# Patient Record
Sex: Female | Born: 1985 | Race: White | Hispanic: No | Marital: Single | Smoking: Never smoker
Health system: Southern US, Community
[De-identification: ages and names within clinical notes are randomized; demographics above are authoritative.]

## PROBLEM LIST (undated history)

## (undated) DIAGNOSIS — T7840XA Allergy, unspecified, initial encounter: Secondary | ICD-10-CM

## (undated) DIAGNOSIS — M419 Scoliosis, unspecified: Secondary | ICD-10-CM

## (undated) DIAGNOSIS — I10 Essential (primary) hypertension: Secondary | ICD-10-CM

## (undated) DIAGNOSIS — Q969 Turner's syndrome, unspecified: Secondary | ICD-10-CM

## (undated) DIAGNOSIS — K7581 Nonalcoholic steatohepatitis (NASH): Secondary | ICD-10-CM

## (undated) DIAGNOSIS — T8859XA Other complications of anesthesia, initial encounter: Secondary | ICD-10-CM

## (undated) DIAGNOSIS — F419 Anxiety disorder, unspecified: Secondary | ICD-10-CM

## (undated) DIAGNOSIS — E079 Disorder of thyroid, unspecified: Secondary | ICD-10-CM

## (undated) DIAGNOSIS — G43909 Migraine, unspecified, not intractable, without status migrainosus: Secondary | ICD-10-CM

## (undated) DIAGNOSIS — E785 Hyperlipidemia, unspecified: Secondary | ICD-10-CM

## (undated) HISTORY — PX: BILATERAL OOPHORECTOMY: SHX1221

## (undated) HISTORY — DX: Disorder of thyroid, unspecified: E07.9

## (undated) HISTORY — PX: HIP SURGERY: SHX245

## (undated) HISTORY — DX: Scoliosis, unspecified: M41.9

## (undated) HISTORY — PX: TONSILLECTOMY: SUR1361

## (undated) HISTORY — DX: Allergy, unspecified, initial encounter: T78.40XA

## (undated) HISTORY — PX: TYMPANOSTOMY TUBE PLACEMENT: SHX32

## (undated) HISTORY — DX: Nonalcoholic steatohepatitis (NASH): K75.81

## (undated) HISTORY — DX: Turner's syndrome, unspecified: Q96.9

## (undated) HISTORY — DX: Essential (primary) hypertension: I10

## (undated) HISTORY — PX: HERNIA REPAIR: SHX51

## (undated) HISTORY — DX: Hyperlipidemia, unspecified: E78.5

## (undated) HISTORY — DX: Anxiety disorder, unspecified: F41.9

---

## 2010-06-27 ENCOUNTER — Ambulatory Visit (INDEPENDENT_AMBULATORY_CARE_PROVIDER_SITE_OTHER): Payer: BC Managed Care – PPO | Admitting: Internal Medicine

## 2010-06-27 DIAGNOSIS — I10 Essential (primary) hypertension: Secondary | ICD-10-CM

## 2010-06-27 DIAGNOSIS — R7989 Other specified abnormal findings of blood chemistry: Secondary | ICD-10-CM

## 2010-06-27 DIAGNOSIS — R51 Headache: Secondary | ICD-10-CM

## 2010-06-27 DIAGNOSIS — Q969 Turner's syndrome, unspecified: Secondary | ICD-10-CM

## 2010-07-25 ENCOUNTER — Other Ambulatory Visit: Payer: Self-pay

## 2010-07-25 ENCOUNTER — Ambulatory Visit (INDEPENDENT_AMBULATORY_CARE_PROVIDER_SITE_OTHER): Payer: BC Managed Care – PPO | Admitting: Internal Medicine

## 2010-07-25 ENCOUNTER — Ambulatory Visit (HOSPITAL_BASED_OUTPATIENT_CLINIC_OR_DEPARTMENT_OTHER)
Admission: RE | Admit: 2010-07-25 | Discharge: 2010-07-25 | Disposition: A | Discharge status: Home / Self Care | Payer: BC Managed Care – PPO | Source: Ambulatory Visit | Attending: Internal Medicine | Admitting: Internal Medicine

## 2010-07-25 ENCOUNTER — Other Ambulatory Visit: Payer: Self-pay | Admitting: Internal Medicine

## 2010-07-25 ENCOUNTER — Ambulatory Visit (INDEPENDENT_AMBULATORY_CARE_PROVIDER_SITE_OTHER)
Admission: RE | Admit: 2010-07-25 | Discharge: 2010-07-25 | Disposition: A | Discharge status: Home / Self Care | Payer: BC Managed Care – PPO | Source: Ambulatory Visit | Attending: Internal Medicine | Admitting: Internal Medicine

## 2010-07-25 DIAGNOSIS — M5124 Other intervertebral disc displacement, thoracic region: Secondary | ICD-10-CM | POA: Insufficient documentation

## 2010-07-25 DIAGNOSIS — Z1272 Encounter for screening for malignant neoplasm of vagina: Secondary | ICD-10-CM

## 2010-07-25 DIAGNOSIS — Q969 Turner's syndrome, unspecified: Secondary | ICD-10-CM | POA: Insufficient documentation

## 2010-07-25 DIAGNOSIS — Z113 Encounter for screening for infections with a predominantly sexual mode of transmission: Secondary | ICD-10-CM

## 2010-07-25 DIAGNOSIS — I339 Acute and subacute endocarditis, unspecified: Secondary | ICD-10-CM

## 2010-07-25 DIAGNOSIS — R030 Elevated blood-pressure reading, without diagnosis of hypertension: Secondary | ICD-10-CM | POA: Insufficient documentation

## 2010-07-25 DIAGNOSIS — Z01419 Encounter for gynecological examination (general) (routine) without abnormal findings: Secondary | ICD-10-CM

## 2010-08-05 ENCOUNTER — Other Ambulatory Visit (HOSPITAL_COMMUNITY): Payer: Self-pay | Admitting: Internal Medicine

## 2010-08-05 DIAGNOSIS — Q969 Turner's syndrome, unspecified: Secondary | ICD-10-CM

## 2010-08-07 ENCOUNTER — Ambulatory Visit (HOSPITAL_COMMUNITY): Payer: BC Managed Care – PPO | Attending: Internal Medicine | Admitting: Radiology

## 2010-08-07 DIAGNOSIS — I10 Essential (primary) hypertension: Secondary | ICD-10-CM | POA: Insufficient documentation

## 2010-08-07 DIAGNOSIS — Q969 Turner's syndrome, unspecified: Secondary | ICD-10-CM | POA: Insufficient documentation

## 2010-08-08 ENCOUNTER — Other Ambulatory Visit (HOSPITAL_COMMUNITY): Payer: BC Managed Care – PPO | Admitting: Radiology

## 2010-08-29 ENCOUNTER — Ambulatory Visit: Payer: BC Managed Care – PPO | Admitting: Internal Medicine

## 2010-12-17 ENCOUNTER — Telehealth: Payer: Self-pay | Admitting: Internal Medicine

## 2010-12-17 NOTE — Telephone Encounter (Signed)
Pt request refill for Premarin , Flonase, Amloeipine.  Pharmacy is [facility] in [place].  Call back number for pt [phone].

## 2010-12-18 ENCOUNTER — Encounter: Payer: Self-pay | Admitting: Emergency Medicine

## 2010-12-18 ENCOUNTER — Other Ambulatory Visit: Payer: Self-pay | Admitting: Emergency Medicine

## 2010-12-18 DIAGNOSIS — I10 Essential (primary) hypertension: Secondary | ICD-10-CM

## 2010-12-18 DIAGNOSIS — Q969 Turner's syndrome, unspecified: Secondary | ICD-10-CM

## 2010-12-18 MED ORDER — FLUTICASONE PROPIONATE 50 MCG/ACT NA SUSP: 1.0000 | Freq: Every day | NASAL | Status: DC

## 2010-12-18 MED ORDER — ESTROGENS CONJUGATED 1.25 MG PO TABS: 1.2500 mg | ORAL_TABLET | Freq: Every day | ORAL | Status: DC

## 2010-12-18 MED ORDER — AMLODIPINE BESYLATE 5 MG PO TABS: 5.0000 mg | ORAL_TABLET | Freq: Every day | ORAL | Status: DC

## 2010-12-18 NOTE — Telephone Encounter (Signed)
Refill encounter sent to [facility] for medication refills

## 2010-12-18 NOTE — Telephone Encounter (Signed)
Requests refills on medications.  Last visit 07/25/10.

## 2011-01-09 ENCOUNTER — Encounter: Payer: Self-pay | Admitting: Internal Medicine

## 2011-01-09 ENCOUNTER — Ambulatory Visit (INDEPENDENT_AMBULATORY_CARE_PROVIDER_SITE_OTHER): Payer: BC Managed Care – PPO | Admitting: Internal Medicine

## 2011-01-09 VITALS — BP 132/82 | HR 95 | Temp 97.2°F | Resp 20 | Wt 163.0 lb

## 2011-01-09 DIAGNOSIS — R05 Cough: Secondary | ICD-10-CM

## 2011-01-09 DIAGNOSIS — Q6589 Other specified congenital deformities of hip: Secondary | ICD-10-CM | POA: Insufficient documentation

## 2011-01-09 DIAGNOSIS — I10 Essential (primary) hypertension: Secondary | ICD-10-CM | POA: Insufficient documentation

## 2011-01-09 DIAGNOSIS — T7840XA Allergy, unspecified, initial encounter: Secondary | ICD-10-CM | POA: Insufficient documentation

## 2011-01-09 DIAGNOSIS — M419 Scoliosis, unspecified: Secondary | ICD-10-CM | POA: Insufficient documentation

## 2011-01-09 DIAGNOSIS — R319 Hematuria, unspecified: Secondary | ICD-10-CM | POA: Insufficient documentation

## 2011-01-09 DIAGNOSIS — K7581 Nonalcoholic steatohepatitis (NASH): Secondary | ICD-10-CM | POA: Insufficient documentation

## 2011-01-09 DIAGNOSIS — E7849 Other hyperlipidemia: Secondary | ICD-10-CM | POA: Insufficient documentation

## 2011-01-09 DIAGNOSIS — E559 Vitamin D deficiency, unspecified: Secondary | ICD-10-CM | POA: Insufficient documentation

## 2011-01-09 DIAGNOSIS — Q969 Turner's syndrome, unspecified: Secondary | ICD-10-CM | POA: Insufficient documentation

## 2011-01-09 DIAGNOSIS — F419 Anxiety disorder, unspecified: Secondary | ICD-10-CM | POA: Insufficient documentation

## 2011-01-09 DIAGNOSIS — M810 Age-related osteoporosis without current pathological fracture: Secondary | ICD-10-CM | POA: Insufficient documentation

## 2011-01-09 DIAGNOSIS — J320 Chronic maxillary sinusitis: Secondary | ICD-10-CM

## 2011-01-09 MED ORDER — AZITHROMYCIN 250 MG PO TABS: ORAL_TABLET | ORAL | Status: AC

## 2011-01-09 NOTE — Patient Instructions (Signed)
Delsym for cough  Afrin nasal spray for stuffiness Take antibiotic asp rescribed

## 2011-01-09 NOTE — Progress Notes (Signed)
Subjective:    Patient ID: Cristina Zimmerman, female    DOB: May 26, 1985, 25 y.o.   MRN: [id number]  HPI Altie has had sore throat, cough and sinus congest ion last 5 days.  No fever, no chest pain,   Occasional productive cough clear mucous  No Known Allergies Past Medical History  Diagnosis Date  . Turner syndrome   . Vitamin D deficiency   . NASH (nonalcoholic steatohepatitis)   . Hip dysplasia   . Hematuria   . Scoliosis     minimal  . Allergy   . Anxiety   . Hyperlipidemia   . Hypertension   . Osteoporosis    Past Surgical History  Procedure Date  . Tympanostomy tube placement   . Hernia repair     x2  . Bilateral oophorectomy   . Hip surgery   . Tonsillectomy    History   Social History  . Marital Status: Single    Spouse Name: N/A    Number of Children: N/A  . Years of Education: N/A   Occupational History  . Not on file.   Social History Main Topics  . Smoking status: Never Smoker   . Smokeless tobacco: Not on file  . Alcohol Use: Yes     socially  . Drug Use: No  . Sexually Active: No   Other Topics Concern  . Not on file   Social History Narrative  . No narrative on file   Family History  Problem Relation Age of Onset  . Hypertension Mother   . Arthritis Mother   . Fibromyalgia Mother   . Diabetes Father   . Diabetes Paternal Grandmother   . Stroke Paternal Grandmother   . Kidney disease Paternal Grandmother   . Diabetes Paternal Grandfather    Patient Active Problem List  Diagnoses  . Allergy  . Anxiety  . Turner syndrome  . Hip dysplasia  . Vitamin D deficiency  . Hematuria  . Hyperlipidemia  . Hypertension  . Osteoporosis  . NASH (nonalcoholic steatohepatitis)  . Scoliosis   Current Outpatient Prescriptions on File Prior to Visit  Medication Sig Dispense Refill  . amLODipine (NORVASC) 5 MG tablet Take 1 tablet (5 mg total) by mouth daily.  30 tablet  2  . estrogens, conjugated, (PREMARIN) 1.25 MG tablet Take 1 tablet (1.25  mg total) by mouth daily.  30 tablet  2  . fexofenadine (ALLEGRA) 60 MG tablet Take 60 mg by mouth daily.        . fluticasone (FLONASE) 50 MCG/ACT nasal spray Place 1 spray into the nose daily.  16 g  1  . glucosamine-chondroitin 500-400 MG tablet Take 1 tablet by mouth 2 (two) times daily.        . medroxyPROGESTERone (PROVERA) 10 MG tablet Take 10 mg by mouth daily. 10 days per month       . Multiple Vitamin (MULTIVITAMIN) tablet Take 1 tablet by mouth daily.        . OMEGA 3 1000 MG CAPS Take 1 capsule by mouth 2 (two) times daily.             Review of Systems    see HPI Objective:   Physical Exam Physical Exam  Nursing note and vitals reviewed.  Constitutional: She is oriented to person, place, and time. She appears well-developed and well-nourished.  HENT: bilateral serous effusions,  Oropharynx erythema.  Ant cervical adenopathy Head: Normocephalic and atraumatic.  Cardiovascular: Normal rate and regular rhythm. Exam reveals  no gallop and no friction rub.  No murmur heard.  Pulmonary/Chest: Breath sounds normal. She has no wheezes. She has no rales.  Neurological: She is alert and oriented to person, place, and time.  Skin: Skin is warm and dry.  Psychiatric: She has a normal mood and affect. Her behavior is normal.          Assessment & Plan:  Sinusitis :  Z pack, Afrin bid  Cough:  OTC delsym  Return prn if not better

## 2011-01-13 ENCOUNTER — Ambulatory Visit: Payer: BC Managed Care – PPO | Admitting: Internal Medicine

## 2011-03-06 ENCOUNTER — Ambulatory Visit (INDEPENDENT_AMBULATORY_CARE_PROVIDER_SITE_OTHER): Payer: BC Managed Care – PPO | Admitting: Internal Medicine

## 2011-03-06 ENCOUNTER — Encounter: Payer: Self-pay | Admitting: Internal Medicine

## 2011-03-06 DIAGNOSIS — K7581 Nonalcoholic steatohepatitis (NASH): Secondary | ICD-10-CM

## 2011-03-06 DIAGNOSIS — R319 Hematuria, unspecified: Secondary | ICD-10-CM

## 2011-03-06 DIAGNOSIS — K7689 Other specified diseases of liver: Secondary | ICD-10-CM

## 2011-03-06 DIAGNOSIS — F419 Anxiety disorder, unspecified: Secondary | ICD-10-CM

## 2011-03-06 DIAGNOSIS — I10 Essential (primary) hypertension: Secondary | ICD-10-CM

## 2011-03-06 DIAGNOSIS — E785 Hyperlipidemia, unspecified: Secondary | ICD-10-CM

## 2011-03-06 DIAGNOSIS — F411 Generalized anxiety disorder: Secondary | ICD-10-CM

## 2011-03-06 LAB — LIPID PANEL
HDL: 42 mg/dL (ref >39)
Total CHOL/HDL Ratio: 5.9 Ratio
Triglycerides: 128 mg/dL (ref <150)

## 2011-03-06 LAB — COMPREHENSIVE METABOLIC PANEL
Albumin: 4.8 g/dL (ref 3.5–5.2)
BUN: 11 mg/dL (ref 6–23)
Calcium: 9.6 mg/dL (ref 8.4–10.5)
Chloride: 106 mEq/L (ref 96–112)
Creat: 0.8 mg/dL (ref 0.50–1.10)
Glucose, Bld: 91 mg/dL (ref 70–99)
Potassium: 4 mEq/L (ref 3.5–5.3)

## 2011-03-06 MED ORDER — ESCITALOPRAM OXALATE 10 MG PO TABS: 10.0000 mg | ORAL_TABLET | Freq: Every day | ORAL | Status: DC

## 2011-03-06 NOTE — Patient Instructions (Signed)
Stop by office to give urine speciemen next week when not on period  Take Lexapro every day  See me in January in office

## 2011-03-06 NOTE — Progress Notes (Signed)
Subjective:    Patient ID: Cristina Zimmerman, female    DOB: 22-Jul-1985, 25 y.o.   MRN: [id number]  HPI  Cristina Zimmerman is here for follow up.  She has been watching lipids in diet and has lost 4 lbs since her last visit.  She did not want to start RX meds for hyperlipidemia.   She notes worsening anxiety and depression most days last few months.  She had beenon Lexapro in the past which helped her when she was a senior in high school.  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  Frequently feels overwhelmed,  Loss of motivation, sleepingmore than usual.  No real anhedonia. Vacillates betweeen feeling anxious and depressed.  Did not see a therapist in past.  Denises suicidal homicidal ideatrion  No psychotic features  FH mother has depression/anxiety  See labs  [facility] hematuria but pt on menses now.  She is asymptomatic No flank pain  No Known Allergies Past Medical History  Diagnosis Date  . Turner syndrome   . Vitamin D deficiency   . NASH (nonalcoholic steatohepatitis)   . Hip dysplasia   . Hematuria   . Scoliosis     minimal  . Allergy   . Anxiety   . Hyperlipidemia   . Hypertension   . Osteoporosis    Past Surgical History  Procedure Date  . Tympanostomy tube placement   . Hernia repair     x2  . Bilateral oophorectomy   . Hip surgery   . Tonsillectomy    History   Social History  . Marital Status: Single    Spouse Name: N/A    Number of Children: N/A  . Years of Education: N/A   Occupational History  . Not on file.   Social History Main Topics  . Smoking status: Never Smoker   . Smokeless tobacco: Not on file  . Alcohol Use: Yes     socially  . Drug Use: No  . Sexually Active: No   Other Topics Concern  . Not on file   Social History Narrative  . No narrative on file   Family History  Problem Relation Age of Onset  . Hypertension Mother   . Arthritis Mother   . Fibromyalgia Mother   . Diabetes Father   . Diabetes Paternal Grandmother   . Stroke Paternal Grandmother     . Kidney disease Paternal Grandmother   . Diabetes Paternal Grandfather    Patient Active Problem List  Diagnoses  . Allergy  . Anxiety  . Turner syndrome  . Hip dysplasia  . Vitamin D deficiency  . Hematuria  . Hyperlipidemia  . Hypertension  . Osteoporosis  . NASH (nonalcoholic steatohepatitis)  . Scoliosis   Current Outpatient Prescriptions on File Prior to Visit  Medication Sig Dispense Refill  . amLODipine (NORVASC) 5 MG tablet Take 1 tablet (5 mg total) by mouth daily.  30 tablet  2  . estrogens, conjugated, (PREMARIN) 1.25 MG tablet Take 1 tablet (1.25 mg total) by mouth daily.  30 tablet  2  . fexofenadine (ALLEGRA) 60 MG tablet Take 60 mg by mouth daily.        . fluticasone (FLONASE) 50 MCG/ACT nasal spray Place 1 spray into the nose daily.  16 g  1  . glucosamine-chondroitin 500-400 MG tablet Take 1 tablet by mouth 2 (two) times daily.        . medroxyPROGESTERone (PROVERA) 10 MG tablet Take 10 mg by mouth daily. 10 days per month       .  Multiple Vitamin (MULTIVITAMIN) tablet Take 1 tablet by mouth daily.        . OMEGA 3 1000 MG CAPS Take 1 capsule by mouth 2 (two) times daily.              Review of Systems    See  HPI Objective:   Physical Exam Physical Exam  Nursing note and vitals reviewed.  Constitutional: She is oriented to person, place, and time. She appears well-developed and well-nourished.  HENT:  Head: Normocephalic and atraumatic.  Cardiovascular: Normal rate and regular rhythm. Exam reveals no gallop and no friction rub.  No murmur heard.  Pulmonary/Chest: Breath sounds normal. She has no wheezes. She has no rales.  Neurological: She is alert and oriented to person, place, and time.  Skin: Skin is warm and dry.  Psychiatric: She has a normal mood and affect. Her behavior is normal.         Assessment & Plan:  1)  Anxiety  Will start Lexapro 10 mg pt counseled on SE profile.  She is to return in 2 months.  If talking therapy needed  will add at that time. Counsleed to call for any worseining depressive or suicidal symptoms 2)  Hyperlipidemia  Check fasting level today 3) Hematuria  coumseled to come to office and leave specimen when off menses.   4)  Turner's syndrome  Managed by Dr. Katrinka Blazing 5)  HTN  Well controlled  See problem list

## 2011-03-11 ENCOUNTER — Encounter: Payer: Self-pay | Admitting: Internal Medicine

## 2011-03-11 ENCOUNTER — Encounter: Payer: Self-pay | Admitting: Emergency Medicine

## 2011-04-16 ENCOUNTER — Emergency Department
Admission: EM | Admit: 2011-04-16 | Discharge: 2011-04-16 | Disposition: A | Discharge status: Home / Self Care | Payer: BC Managed Care – PPO | Source: Home / Self Care | Attending: Family Medicine | Admitting: Family Medicine

## 2011-04-16 ENCOUNTER — Encounter: Payer: Self-pay | Admitting: Emergency Medicine

## 2011-04-16 DIAGNOSIS — J069 Acute upper respiratory infection, unspecified: Secondary | ICD-10-CM

## 2011-04-16 DIAGNOSIS — H6980 Other specified disorders of Eustachian tube, unspecified ear: Secondary | ICD-10-CM

## 2011-04-16 MED ORDER — DEXAMETHASONE 1.5 MG PO KIT: 1.0000 | PACK | ORAL | Status: DC

## 2011-04-16 MED ORDER — BENZONATATE 200 MG PO CAPS: 200.0000 mg | ORAL_CAPSULE | Freq: Every day | ORAL | Status: AC

## 2011-04-16 MED ORDER — AMOXICILLIN 875 MG PO TABS: 875.0000 mg | ORAL_TABLET | Freq: Two times a day (BID) | ORAL | Status: AC

## 2011-04-16 NOTE — ED Provider Notes (Signed)
History     CSN: [id number]  Arrival date & time 04/16/11  1620   First MD Initiated Contact with Patient 04/16/11 1703      Chief Complaint  Patient presents with  . Nasal Congestion      HPI Comments: Patient complains of approximately 3 day history of gradually progressive URI symptoms beginning with nausea followed by progressive nasal congestion but no sore throat.  There has been minimal cough  Complains of fatigue but no myalgias. She complains of fullness in the right ear but no earache.  There has been no pleuritic pain, shortness of breath, or wheezes.  She has a history of seasonal allergies and multiple ear infections in the past.  The history is provided by the patient.    Past Medical History  Diagnosis Date  . Turner syndrome   . Vitamin D deficiency   . NASH (nonalcoholic steatohepatitis)   . Hip dysplasia   . Hematuria   . Scoliosis     minimal  . Allergy   . Anxiety   . Hyperlipidemia   . Hypertension   . Osteoporosis     Past Surgical History  Procedure Date  . Tympanostomy tube placement   . Hernia repair     x2  . Bilateral oophorectomy   . Hip surgery   . Tonsillectomy     Family History  Problem Relation Age of Onset  . Hypertension Mother   . Arthritis Mother   . Fibromyalgia Mother   . Diabetes Father   . Diabetes Paternal Grandmother   . Stroke Paternal Grandmother   . Kidney disease Paternal Grandmother   . Diabetes Paternal Grandfather     History  Substance Use Topics  . Smoking status: Never Smoker   . Smokeless tobacco: Not on file  . Alcohol Use: Yes     socially    OB History    Grav Para Term Preterm Abortions TAB SAB Ect Mult Living                  Review of Systems No sore throat Minimal cough No pleuritic pain No wheezing + nasal congestion + post-nasal drainage ? sinus pain/pressure No itchy/red eyes ? Right earache No hemoptysis No SOB No fever/chills + nausea initially No vomiting No  abdominal pain No diarrhea No urinary symptoms No skin rashes + fatigue No myalgias Mild headache Used OTC meds without relief  Allergies  Review of patient's allergies indicates no known allergies.  Home Medications   Current Outpatient Rx  Name Route Sig Dispense Refill  . AMLODIPINE BESYLATE 5 MG PO TABS Oral Take 1 tablet (5 mg total) by mouth daily. 30 tablet 2  . AMOXICILLIN 875 MG PO TABS Oral Take 1 tablet (875 mg total) by mouth 2 (two) times daily. (Rx void after 04/24/11) 20 tablet 0  . BENZONATATE 200 MG PO CAPS Oral Take 1 capsule (200 mg total) by mouth at bedtime. Take as needed for cough 12 capsule 0  . DEXAMETHASONE 1.5 MG PO KIT Oral Take 1 kit (1.5 mg total) by mouth as directed. Begin tomorrow 1 kit 0  . ESCITALOPRAM OXALATE 10 MG PO TABS Oral Take 1 tablet (10 mg total) by mouth daily. 30 tablet 2  . ESTROGENS CONJUGATED 1.25 MG PO TABS Oral Take 1 tablet (1.25 mg total) by mouth daily. 30 tablet 2  . FEXOFENADINE HCL 60 MG PO TABS Oral Take 60 mg by mouth daily.      Marland Kitchen  FLUTICASONE PROPIONATE 50 MCG/ACT NA SUSP Nasal Place 1 spray into the nose daily. 16 g 1  . GLUCOSAMINE-CHONDROITIN 500-400 MG PO TABS Oral Take 1 tablet by mouth 2 (two) times daily.      Marland Kitchen MEDROXYPROGESTERONE ACETATE 10 MG PO TABS Oral Take 10 mg by mouth daily. 10 days per month     . ONE-DAILY MULTI VITAMINS PO TABS Oral Take 1 tablet by mouth daily.      . OMEGA 3 1000 MG PO CAPS Oral Take 1 capsule by mouth 2 (two) times daily.         BP 142/89  Pulse 84  Temp(Src) 98.6 F (37 C) (Oral)  Resp 16  Ht 5\' 2"  (1.575 m)  Wt 155 lb (70.308 kg)  BMI 28.35 kg/m2  SpO2 98%  LMP 03/11/2011  Physical Exam Nursing notes and Vital Signs reviewed. Appearance:  Patient appears healthy, stated age, and in no acute distress Eyes:  Pupils are equal, round, and reactive to light and accomodation.  Extraocular movement is intact.  Conjunctivae are not inflamed  Ears:  Canals normal.  Tympanic  membranes normal; right TM slightly erythematous centrally Nose:  Mildly congested turbinates, worse on right.  No sinus tenderness.     Pharynx:  Normal Neck:  Supple.  Slightly tender shotty anterior/posterior nodes are palpated bilaterally  Lungs:  Clear to auscultation.  Breath sounds are equal.  Chest:  Distinct tenderness to palpation over the mid-sternum.  Heart:  Regular rate and rhythm without murmurs, rubs, or gallops.  Abdomen:  Nontender without masses or hepatosplenomegaly.  Bowel sounds are present.  No CVA or flank tenderness.  Extremities:  No edema.  No calf tenderness Skin:  No rash present.   ED Course  Procedures  Tympanogram normal on left; positive peak pressure on right      1. Eustachian tube dysfunction   2. Acute upper respiratory infections of unspecified site       MDM  There is no definite evidence of bacterial infection today.   Begin tapering course of Dexamethasone (start tomorrow).  Tessalon for cough at night. Take Mucinex (guaifenesin) twice daily for congestion.  Increase fluid intake, rest. May use Afrin nasal spray (or generic oxymetazoline) twice daily for about 5 days.  Also recommend using saline nasal spray several times daily and/or saline nasal irrigation.  Continue Flonase Stop all antihistamines for now, and other non-prescription cough/cold preparations. Begin Amoxicillin if not improving about 5 days or if persistent fever develops. Follow-up with ENT physician if not improving about 10 days.         Donna Christen, MD 04/16/11 1739

## 2011-04-16 NOTE — ED Notes (Signed)
Congestion, ear pain, cough and nausea x 3-4 days. No Flu vaccination this season.

## 2011-04-23 ENCOUNTER — Encounter: Payer: Self-pay | Admitting: Emergency Medicine

## 2011-04-23 ENCOUNTER — Ambulatory Visit (INDEPENDENT_AMBULATORY_CARE_PROVIDER_SITE_OTHER): Payer: BC Managed Care – PPO | Admitting: Internal Medicine

## 2011-04-23 ENCOUNTER — Encounter: Payer: Self-pay | Admitting: Internal Medicine

## 2011-04-23 DIAGNOSIS — N390 Urinary tract infection, site not specified: Secondary | ICD-10-CM

## 2011-04-23 DIAGNOSIS — R319 Hematuria, unspecified: Secondary | ICD-10-CM

## 2011-04-23 LAB — CBC WITH DIFFERENTIAL/PLATELET
Eosinophils Absolute: 0.1 10*3/uL (ref 0.0–0.7)
Eosinophils Relative: 1 % (ref 0–5)
Hemoglobin: 13.7 g/dL (ref 12.0–15.0)
Lymphocytes Relative: 22 % (ref 12–46)
Lymphs Abs: 2.1 10*3/uL (ref 0.7–4.0)
MCH: 29.5 pg (ref 26.0–34.0)
MCV: 87.3 fL (ref 78.0–100.0)
Monocytes Relative: 10 % (ref 3–12)
Neutrophils Relative %: 67 % (ref 43–77)
RBC: 4.65 MIL/uL (ref 3.87–5.11)
WBC: 9.9 10*3/uL (ref 4.0–10.5)

## 2011-04-23 LAB — POCT URINALYSIS DIPSTICK
Bilirubin, UA: NEGATIVE
Glucose, UA: NEGATIVE
Spec Grav, UA: <=1.005

## 2011-04-23 LAB — COMPREHENSIVE METABOLIC PANEL
ALT: 50 U/L — ABNORMAL HIGH (ref 0–35)
CO2: 27 mEq/L (ref 19–32)
Calcium: 8.1 mg/dL — ABNORMAL LOW (ref 8.4–10.5)
Chloride: 100 mEq/L (ref 96–112)
Glucose, Bld: 90 mg/dL (ref 70–99)
Sodium: 136 mEq/L (ref 135–145)
Total Bilirubin: 0.3 mg/dL (ref 0.3–1.2)
Total Protein: 6.7 g/dL (ref 6.0–8.3)

## 2011-04-23 MED ORDER — CIPROFLOXACIN HCL 500 MG PO TABS: 500.0000 mg | ORAL_TABLET | Freq: Two times a day (BID) | ORAL | Status: DC

## 2011-04-23 MED ORDER — CIPROFLOXACIN HCL 500 MG PO TABS: 500.0000 mg | ORAL_TABLET | Freq: Two times a day (BID) | ORAL | Status: AC

## 2011-04-23 NOTE — Progress Notes (Signed)
Subjective:    Patient ID: Cristina Zimmerman, female    DOB: 1985-12-28, 28 y.o.   MRN: [id number]  HPI  Cristina Zimmerman is here for an acute visit.  She comes in with her mother.   Pt states she is not sure what is wrong , she just doesn't feel well.  Has Persistant nausea, some urinary frequency.  No documented fever,  Rare cough nonproductive.  Only new meds are starting Lexapro 2 weeks ago and had 2 doses of Fosamax given to her by Dr. Katrinka Blazing.   Mother reports that pt had similar episode last year and was treated for a UTI  dehydration and pt improved.  She denies fever, abd pain,  Vaginal discharge, no vomiting.  She is on her menses now  No Known Allergies Past Medical History  Diagnosis Date  . Turner syndrome   . Vitamin D deficiency   . NASH (nonalcoholic steatohepatitis)   . Hip dysplasia   . Hematuria   . Scoliosis     minimal  . Allergy   . Anxiety   . Hyperlipidemia   . Hypertension   . Osteoporosis    Past Surgical History  Procedure Date  . Tympanostomy tube placement   . Hernia repair     x2  . Bilateral oophorectomy   . Hip surgery   . Tonsillectomy    History   Social History  . Marital Status: Single    Spouse Name: N/A    Number of Children: N/A  . Years of Education: N/A   Occupational History  . Not on file.   Social History Main Topics  . Smoking status: Never Smoker   . Smokeless tobacco: Not on file  . Alcohol Use: Yes     socially  . Drug Use: No  . Sexually Active: No   Other Topics Concern  . Not on file   Social History Narrative  . No narrative on file   Family History  Problem Relation Age of Onset  . Hypertension Mother   . Arthritis Mother   . Fibromyalgia Mother   . Diabetes Father   . Diabetes Paternal Grandmother   . Stroke Paternal Grandmother   . Kidney disease Paternal Grandmother   . Diabetes Paternal Grandfather    Patient Active Problem List  Diagnoses  . Allergy  . Anxiety  . Turner syndrome  . Hip dysplasia  .  Vitamin D deficiency  . Hematuria  . Hyperlipidemia  . Hypertension  . Osteoporosis  . NASH (nonalcoholic steatohepatitis)  . Scoliosis   Current Outpatient Prescriptions on File Prior to Visit  Medication Sig Dispense Refill  . amLODipine (NORVASC) 5 MG tablet Take 1 tablet (5 mg total) by mouth daily.  30 tablet  2  . escitalopram (LEXAPRO) 10 MG tablet Take 1 tablet (10 mg total) by mouth daily.  30 tablet  2  . estrogens, conjugated, (PREMARIN) 1.25 MG tablet Take 1 tablet (1.25 mg total) by mouth daily.  30 tablet  2  . fexofenadine (ALLEGRA) 60 MG tablet Take 60 mg by mouth daily.        . fluticasone (FLONASE) 50 MCG/ACT nasal spray Place 1 spray into the nose daily.  16 g  1  . glucosamine-chondroitin 500-400 MG tablet Take 1 tablet by mouth 2 (two) times daily.        . medroxyPROGESTERone (PROVERA) 10 MG tablet Take 10 mg by mouth daily. 10 days per month       . Multiple  Vitamin (MULTIVITAMIN) tablet Take 1 tablet by mouth daily.        . OMEGA 3 1000 MG CAPS Take 1 capsule by mouth 2 (two) times daily.        Marland Kitchen amoxicillin (AMOXIL) 875 MG tablet Take 1 tablet (875 mg total) by mouth 2 (two) times daily. (Rx void after 04/24/11)  20 tablet  0  . benzonatate (TESSALON) 200 MG capsule Take 1 capsule (200 mg total) by mouth at bedtime. Take as needed for cough  12 capsule  0      Review of Systems See HPI    Objective:   Physical Exam Physical Exam  Nursing note and vitals reviewed.  Constitutional: She is oriented to person, place, and time. She appears well-developed and well-nourished.  HENT:  Head: Normocephalic and atraumatic.  Cardiovascular: Normal rate and regular rhythm. Exam reveals no gallop and no friction rub.  No murmur heard.  Pulmonary/Chest: Breath sounds normal. She has no wheezes. She has no rales.  ABD.  BS +  No HSM  Non tender non distended  .  No CVA tenderness no suprapubic tenderness Neurological: She is alert and oriented to person, place, and  time.  Skin: Skin is warm and dry.  Psychiatric: She has a normal mood and affect. Her behavior is normal.      Assessment & Plan:  1)  Hematuria pt on menses but with similar vague and nondescript symtoms will empirically treat for UTI with Cipro  Send C and S.  Rechedk one week.  Willl also check cbc chemstries 2)  Nausea.  Just started Lexapro 2 weeks ago.  This is possible culprit.   If still nauseated next week,  Will consider stopping Lexapro 3)  Turner's syndrome

## 2011-04-23 NOTE — Progress Notes (Signed)
Addended by: Nelly Laurence H on: 04/23/2011 04:40 PM   Modules accepted: Orders

## 2011-04-23 NOTE — Patient Instructions (Addendum)
Take medicine twice a day for one week.    See me in one week  Labs will be mailed to you

## 2011-04-24 ENCOUNTER — Telehealth: Payer: Self-pay | Admitting: Internal Medicine

## 2011-04-24 DIAGNOSIS — N390 Urinary tract infection, site not specified: Secondary | ICD-10-CM

## 2011-04-24 NOTE — Telephone Encounter (Signed)
Pt calling tofind out the results of the blood work. Pt was advised of that a note will be given to the nurse and a call may be return today or first thing in the morning. . Per pt she was told that results would be in today. Please call at [phone]

## 2011-04-25 ENCOUNTER — Telehealth: Payer: Self-pay | Admitting: Internal Medicine

## 2011-04-25 NOTE — Telephone Encounter (Signed)
I attempted to call pt 3 times today 1/4.  Second and third attempt at 4 and 5 pm.   Pt unavailable Left message to call office on Monday

## 2011-04-25 NOTE — Telephone Encounter (Signed)
Have you reviewed labs?

## 2011-04-25 NOTE — Telephone Encounter (Signed)
Returned call to mobile regarding labs.  Pt unavailable  Will try again later today

## 2011-04-26 LAB — CULTURE, URINE COMPREHENSIVE: Colony Count: 40000

## 2011-04-28 MED ORDER — NITROFURANTOIN MONOHYD MACRO 100 MG PO CAPS: 100.0000 mg | ORAL_CAPSULE | Freq: Two times a day (BID) | ORAL | Status: AC

## 2011-04-28 NOTE — Telephone Encounter (Signed)
Spoke with pt and informed of slightly low K and slightly low Calcium.  She is taking calcium and Vit D per Dr.., Smith's instrucitons  States she is feeling much better.  See urine culture.  E coli resisitant to cipro.  Will switch to Macrobid.  Pt voices understanding and will change med today

## 2011-04-29 ENCOUNTER — Encounter: Payer: Self-pay | Admitting: Internal Medicine

## 2011-04-29 ENCOUNTER — Ambulatory Visit (INDEPENDENT_AMBULATORY_CARE_PROVIDER_SITE_OTHER): Payer: BC Managed Care – PPO | Admitting: Internal Medicine

## 2011-04-29 VITALS — BP 120/68 | HR 103 | Temp 98.6°F | Resp 16 | Ht 62.25 in | Wt 158.0 lb

## 2011-04-29 DIAGNOSIS — E876 Hypokalemia: Secondary | ICD-10-CM

## 2011-04-29 DIAGNOSIS — N390 Urinary tract infection, site not specified: Secondary | ICD-10-CM

## 2011-04-29 LAB — COMPREHENSIVE METABOLIC PANEL
AST: 47 U/L — ABNORMAL HIGH (ref 0–37)
Alkaline Phosphatase: 70 U/L (ref 39–117)
BUN: 10 mg/dL (ref 6–23)
Creat: 0.84 mg/dL (ref 0.50–1.10)
Glucose, Bld: 113 mg/dL — ABNORMAL HIGH (ref 70–99)
Potassium: 4 mEq/L (ref 3.5–5.3)
Total Bilirubin: 0.8 mg/dL (ref 0.3–1.2)

## 2011-04-29 NOTE — Patient Instructions (Signed)
To return 4-6 weeks for urinalysis

## 2011-04-29 NOTE — Telephone Encounter (Signed)
close

## 2011-04-29 NOTE — Progress Notes (Signed)
Subjective:    Patient ID: Cristina Zimmerman, female    DOB: Nov 01, 1985, 25 y.o.   MRN: [id number]  HPI  Cristina Zimmerman is here for follow up.  States she is feeling much better.  She is smiling as I enter room  See urine culture.  I changed to macrobid but pt has not picked it up as yet.    No dysuria, no frequency no CVA pain or fever  See labs  K and Ca slightly low.  No vomiting just nausea from last visit  No Known Allergies Past Medical History  Diagnosis Date  . Turner syndrome   . Vitamin D deficiency   . NASH (nonalcoholic steatohepatitis)   . Hip dysplasia   . Hematuria   . Scoliosis     minimal  . Allergy   . Anxiety   . Hyperlipidemia   . Hypertension   . Osteoporosis    Past Surgical History  Procedure Date  . Tympanostomy tube placement   . Hernia repair     x2  . Bilateral oophorectomy   . Hip surgery   . Tonsillectomy    History   Social History  . Marital Status: Single    Spouse Name: N/A    Number of Children: N/A  . Years of Education: N/A   Occupational History  . Not on file.   Social History Main Topics  . Smoking status: Never Smoker   . Smokeless tobacco: Not on file  . Alcohol Use: Yes     socially  . Drug Use: No  . Sexually Active: No   Other Topics Concern  . Not on file   Social History Narrative  . No narrative on file   Family History  Problem Relation Age of Onset  . Hypertension Mother   . Arthritis Mother   . Fibromyalgia Mother   . Diabetes Father   . Diabetes Paternal Grandmother   . Stroke Paternal Grandmother   . Kidney disease Paternal Grandmother   . Diabetes Paternal Grandfather    Patient Active Problem List  Diagnoses  . Allergy  . Anxiety  . Turner syndrome  . Hip dysplasia  . Vitamin D deficiency  . Hematuria  . Hyperlipidemia  . Hypertension  . Osteoporosis  . NASH (nonalcoholic steatohepatitis)  . Scoliosis   Current Outpatient Prescriptions on File Prior to Visit  Medication Sig Dispense Refill    . alendronate (FOSAMAX) 70 MG tablet Take 70 mg by mouth every 7 (seven) days. Take with a full glass of water on an empty stomach.       Marland Kitchen amLODipine (NORVASC) 5 MG tablet Take 1 tablet (5 mg total) by mouth daily.  30 tablet  2  . escitalopram (LEXAPRO) 10 MG tablet Take 1 tablet (10 mg total) by mouth daily.  30 tablet  2  . estrogens, conjugated, (PREMARIN) 1.25 MG tablet Take 1 tablet (1.25 mg total) by mouth daily.  30 tablet  2  . fexofenadine (ALLEGRA) 60 MG tablet Take 60 mg by mouth daily.        . fluticasone (FLONASE) 50 MCG/ACT nasal spray Place 1 spray into the nose daily.  16 g  1  . glucosamine-chondroitin 500-400 MG tablet Take 1 tablet by mouth 2 (two) times daily.        . medroxyPROGESTERone (PROVERA) 10 MG tablet Take 10 mg by mouth daily. 10 days per month       . Multiple Vitamin (MULTIVITAMIN) tablet Take 1 tablet by  mouth daily.        . OMEGA 3 1000 MG CAPS Take 1 capsule by mouth 2 (two) times daily.        . Vitamin D, Ergocalciferol, (DRISDOL) 50000 UNITS CAPS Take 50,000 Units by mouth every 7 (seven) days.        . nitrofurantoin, macrocrystal-monohydrate, (MACROBID) 100 MG capsule Take 1 capsule (100 mg total) by mouth 2 (two) times daily.  20 capsule  1      Review of Systems    see HPI Objective:   Physical Exam  Physical Exam  Nursing note and vitals reviewed.  Constitutional: She is oriented to person, place, and time. She appears well-developed and well-nourished.  HENT:  Head: Normocephalic and atraumatic.  Cardiovascular: Normal rate and regular rhythm. Exam reveals no gallop and no friction rub.  No murmur heard.  Pulmonary/Chest: Breath sounds normal. She has no wheezes. She has no rales.  ABd:  Soft nontender nondistended.  No CVA tenderness Neurological: She is alert and oriented to person, place, and time.  Skin: Skin is warm and dry.  Psychiatric: She has a normal mood and affect. Her behavior is normal.        Assessment & Plan:   1)  Early UTI  I counseled pt to be sure to start dMacrobid and compelete course.  She was also counseled to come backin 4-6 weeks when off her menses to leave a u/A 2)  Hypokalemia  Recheck today 3)  Minimal hypocalcemia  She is on Fosamax and take daily calcium per Dr. Solon [place] recommendaaitons.  Will recheck today

## 2011-04-30 ENCOUNTER — Encounter: Payer: Self-pay | Admitting: Emergency Medicine

## 2011-05-22 ENCOUNTER — Encounter: Payer: Self-pay | Admitting: Emergency Medicine

## 2011-05-22 ENCOUNTER — Encounter: Payer: Self-pay | Admitting: Internal Medicine

## 2011-05-22 ENCOUNTER — Ambulatory Visit (INDEPENDENT_AMBULATORY_CARE_PROVIDER_SITE_OTHER): Payer: BC Managed Care – PPO | Admitting: Internal Medicine

## 2011-05-22 VITALS — BP 134/81 | HR 106 | Temp 98.3°F | Ht 62.25 in | Wt 156.1 lb

## 2011-05-22 DIAGNOSIS — J40 Bronchitis, not specified as acute or chronic: Secondary | ICD-10-CM

## 2011-05-22 DIAGNOSIS — R05 Cough: Secondary | ICD-10-CM

## 2011-05-22 MED ORDER — ALBUTEROL SULFATE HFA 108 (90 BASE) MCG/ACT IN AERS: INHALATION_SPRAY | RESPIRATORY_TRACT | Status: DC

## 2011-05-22 MED ORDER — AZITHROMYCIN 250 MG PO TABS: ORAL_TABLET | ORAL | Status: AC

## 2011-05-22 MED ORDER — HYDROCOD POLST-CHLORPHEN POLST 10-8 MG/5ML PO LQCR: 5.0000 mL | Freq: Two times a day (BID) | ORAL | Status: DC

## 2011-05-22 NOTE — Progress Notes (Signed)
Subjective:    Patient ID: Cristina Zimmerman, female    DOB: 1985-07-26, 26 y.o.   MRN: [id number]  HPI  Cristina Zimmerman is here for an acute visit.  Several days of sore throat and now cough productive of yellow mucous with laryngitis.  NO fever no chest pain no sob  She had influenza vaccine.    She report she has had asthmatic bronchitis in past  No Known Allergies Past Medical History  Diagnosis Date  . Turner syndrome   . Vitamin d deficiency   . NASH (nonalcoholic steatohepatitis)   . Hip dysplasia   . Hematuria   . Scoliosis     minimal  . Allergy   . Anxiety   . Hyperlipidemia   . Hypertension   . Osteoporosis    Past Surgical History  Procedure Date  . Tympanostomy tube placement   . Hernia repair     x2  . Bilateral oophorectomy   . Hip surgery   . Tonsillectomy    History   Social History  . Marital Status: Single    Spouse Name: N/A    Number of Children: N/A  . Years of Education: N/A   Occupational History  . Not on file.   Social History Main Topics  . Smoking status: Never Smoker   . Smokeless tobacco: Not on file  . Alcohol Use: Yes     socially  . Drug Use: No  . Sexually Active: No   Other Topics Concern  . Not on file   Social History Narrative  . No narrative on file   Family History  Problem Relation Age of Onset  . Hypertension Mother   . Arthritis Mother   . Fibromyalgia Mother   . Diabetes Father   . Diabetes Paternal Grandmother   . Stroke Paternal Grandmother   . Kidney disease Paternal Grandmother   . Diabetes Paternal Grandfather    Patient Active Problem List  Diagnoses  . Allergy  . Anxiety  . Turner syndrome  . Hip dysplasia  . Vitamin D deficiency  . Hematuria  . Hyperlipidemia  . Hypertension  . Osteoporosis  . NASH (nonalcoholic steatohepatitis)  . Scoliosis   Current Outpatient Prescriptions on File Prior to Visit  Medication Sig Dispense Refill  . alendronate (FOSAMAX) 70 MG tablet Take 70 mg by mouth every  7 (seven) days. Take with a full glass of water on an empty stomach.       Marland Kitchen amLODipine (NORVASC) 5 MG tablet Take 1 tablet (5 mg total) by mouth daily.  30 tablet  2  . escitalopram (LEXAPRO) 10 MG tablet Take 1 tablet (10 mg total) by mouth daily.  30 tablet  2  . estrogens, conjugated, (PREMARIN) 1.25 MG tablet Take 1 tablet (1.25 mg total) by mouth daily.  30 tablet  2  . fexofenadine (ALLEGRA) 60 MG tablet Take 60 mg by mouth daily.        . fluticasone (FLONASE) 50 MCG/ACT nasal spray Place 1 spray into the nose daily.  16 g  1  . glucosamine-chondroitin 500-400 MG tablet Take 1 tablet by mouth 2 (two) times daily.        . medroxyPROGESTERone (PROVERA) 10 MG tablet Take 10 mg by mouth daily. 10 days per month       . Multiple Vitamin (MULTIVITAMIN) tablet Take 1 tablet by mouth daily.        . OMEGA 3 1000 MG CAPS Take 1 capsule by mouth 2 (two)  times daily.        . Vitamin D, Ergocalciferol, (DRISDOL) 50000 UNITS CAPS Take 50,000 Units by mouth every 7 (seven) days.            Review of Systems    see HPI Objective:   Physical Exam Physical Exam  Nursing note and vitals reviewed.  Constitutional: She is oriented to person, place, and time. She appears well-developed and well-nourished. She is cooperative.  HENT:  Head: Normocephalic and atraumatic.  Nose: Mucosal edema present.  Eyes: Conjunctivae and EOM are normal. Pupils are equal, round, and reactive to light.  Neck: Neck supple.  Cardiovascular: Regular rhythm, normal heart sounds, intact distal pulses and normal pulses. Exam reveals no gallop and no friction rub.  No murmur heard.  Pulmonary/Chest: She has no wheezes. She has rhonchi. She has no rales.  Neurological: She is alert and oriented to person, place, and time.  Skin: Skin is warm and dry. No abrasion, no bruising, no ecchymosis and no rash noted. No cyanosis. Nails show no clubbing.  Psychiatric: She has a normal mood and affect. Her speech is normal and  behavior is normal.         Assessment & Plan:  1)  Bronchitis/laryngitis  This is likely viral but will give .  Albuterol MDI tid,  Tussionex cough syrup and if not better in 3-4 days can take z-pak 2)  Cough SEe above  Return if not better

## 2011-05-22 NOTE — Patient Instructions (Signed)
Return if not better

## 2011-08-16 ENCOUNTER — Other Ambulatory Visit: Payer: Self-pay | Admitting: Internal Medicine

## 2011-09-01 ENCOUNTER — Encounter (HOSPITAL_BASED_OUTPATIENT_CLINIC_OR_DEPARTMENT_OTHER): Payer: Self-pay | Admitting: *Deleted

## 2011-09-01 ENCOUNTER — Emergency Department (INDEPENDENT_AMBULATORY_CARE_PROVIDER_SITE_OTHER): Payer: No Typology Code available for payment source

## 2011-09-01 ENCOUNTER — Emergency Department (HOSPITAL_BASED_OUTPATIENT_CLINIC_OR_DEPARTMENT_OTHER)
Admission: EM | Admit: 2011-09-01 | Discharge: 2011-09-01 | Disposition: A | Discharge status: Home / Self Care | Payer: No Typology Code available for payment source | Attending: Emergency Medicine | Admitting: Emergency Medicine

## 2011-09-01 ENCOUNTER — Other Ambulatory Visit: Payer: Self-pay | Admitting: Internal Medicine

## 2011-09-01 DIAGNOSIS — S43409A Unspecified sprain of unspecified shoulder joint, initial encounter: Secondary | ICD-10-CM

## 2011-09-01 DIAGNOSIS — R209 Unspecified disturbances of skin sensation: Secondary | ICD-10-CM | POA: Insufficient documentation

## 2011-09-01 DIAGNOSIS — S139XXA Sprain of joints and ligaments of unspecified parts of neck, initial encounter: Secondary | ICD-10-CM | POA: Insufficient documentation

## 2011-09-01 DIAGNOSIS — M545 Low back pain, unspecified: Secondary | ICD-10-CM | POA: Insufficient documentation

## 2011-09-01 DIAGNOSIS — Z79899 Other long term (current) drug therapy: Secondary | ICD-10-CM | POA: Insufficient documentation

## 2011-09-01 DIAGNOSIS — Z043 Encounter for examination and observation following other accident: Secondary | ICD-10-CM

## 2011-09-01 DIAGNOSIS — R079 Chest pain, unspecified: Secondary | ICD-10-CM | POA: Insufficient documentation

## 2011-09-01 DIAGNOSIS — E785 Hyperlipidemia, unspecified: Secondary | ICD-10-CM | POA: Insufficient documentation

## 2011-09-01 DIAGNOSIS — M25519 Pain in unspecified shoulder: Secondary | ICD-10-CM

## 2011-09-01 DIAGNOSIS — I10 Essential (primary) hypertension: Secondary | ICD-10-CM | POA: Insufficient documentation

## 2011-09-01 DIAGNOSIS — IMO0002 Reserved for concepts with insufficient information to code with codable children: Secondary | ICD-10-CM | POA: Insufficient documentation

## 2011-09-01 DIAGNOSIS — Q969 Turner's syndrome, unspecified: Secondary | ICD-10-CM | POA: Insufficient documentation

## 2011-09-01 DIAGNOSIS — S161XXA Strain of muscle, fascia and tendon at neck level, initial encounter: Secondary | ICD-10-CM

## 2011-09-01 DIAGNOSIS — M542 Cervicalgia: Secondary | ICD-10-CM

## 2011-09-01 DIAGNOSIS — M810 Age-related osteoporosis without current pathological fracture: Secondary | ICD-10-CM | POA: Insufficient documentation

## 2011-09-01 MED ORDER — NAPROXEN 500 MG PO TABS: 500.0000 mg | ORAL_TABLET | Freq: Two times a day (BID) | ORAL | Status: AC

## 2011-09-01 MED ORDER — HYDROCODONE-ACETAMINOPHEN 5-325 MG PO TABS: 1.0000 | ORAL_TABLET | Freq: Four times a day (QID) | ORAL | Status: AC | PRN

## 2011-09-01 NOTE — ED Notes (Signed)
MD at bedside discussing x ray results with pt and family member
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
## 2011-09-02 ENCOUNTER — Telehealth: Payer: Self-pay | Admitting: *Deleted

## 2011-09-02 NOTE — Telephone Encounter (Signed)
Cristina Zimmerman would you call pt and let her know that all her hormone refills should be done by her endocrinologist Dr. Tamala Julian.  He takes care of her Turner's syndrome

## 2011-09-02 NOTE — Telephone Encounter (Signed)
LM on pt's cell phone that her RF's for Premarin and any other hormones should be handled by her endocrinologist.  He is the one who takes care of her Turner's syndrome.  Informed pt that if she had any questions to call the office.

## 2011-09-08 ENCOUNTER — Ambulatory Visit: Payer: BC Managed Care – PPO | Admitting: Internal Medicine

## 2011-10-01 ENCOUNTER — Other Ambulatory Visit: Payer: Self-pay | Admitting: Internal Medicine

## 2011-10-02 ENCOUNTER — Other Ambulatory Visit: Payer: Self-pay | Admitting: Internal Medicine

## 2011-12-09 ENCOUNTER — Telehealth: Payer: Self-pay | Admitting: Internal Medicine

## 2011-12-09 NOTE — Telephone Encounter (Signed)
Left message on voicemail for pt to call office.  I wish to check on her condition since her last office visit with Dr. Alberteen Sam

## 2011-12-10 ENCOUNTER — Telehealth: Payer: Self-pay | Admitting: Internal Medicine

## 2011-12-10 DIAGNOSIS — Q969 Turner's syndrome, unspecified: Secondary | ICD-10-CM

## 2011-12-10 DIAGNOSIS — E559 Vitamin D deficiency, unspecified: Secondary | ICD-10-CM
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
## 2012-03-15 ENCOUNTER — Ambulatory Visit (HOSPITAL_BASED_OUTPATIENT_CLINIC_OR_DEPARTMENT_OTHER)
Admission: RE | Admit: 2012-03-15 | Discharge: 2012-03-15 | Disposition: A | Discharge status: Home / Self Care | Payer: BC Managed Care – PPO | Source: Ambulatory Visit | Attending: Internal Medicine | Admitting: Internal Medicine

## 2012-03-15 ENCOUNTER — Encounter: Payer: Self-pay | Admitting: Internal Medicine

## 2012-03-15 ENCOUNTER — Other Ambulatory Visit: Payer: Self-pay | Admitting: Internal Medicine

## 2012-03-15 ENCOUNTER — Ambulatory Visit (INDEPENDENT_AMBULATORY_CARE_PROVIDER_SITE_OTHER): Payer: BC Managed Care – PPO | Admitting: Internal Medicine

## 2012-03-15 VITALS — BP 133/88 | HR 97 | Temp 97.0°F | Resp 20 | Wt 162.4 lb

## 2012-03-15 DIAGNOSIS — R059 Cough, unspecified: Secondary | ICD-10-CM

## 2012-03-15 DIAGNOSIS — R05 Cough: Secondary | ICD-10-CM

## 2012-03-15 DIAGNOSIS — J329 Chronic sinusitis, unspecified: Secondary | ICD-10-CM

## 2012-03-15 DIAGNOSIS — J40 Bronchitis, not specified as acute or chronic: Secondary | ICD-10-CM

## 2012-03-15 MED ORDER — AZITHROMYCIN 250 MG PO TABS: ORAL_TABLET | ORAL | Status: DC

## 2012-03-15 MED ORDER — CEFTRIAXONE SODIUM 1 G IJ SOLR
500.0000 mg | INTRAMUSCULAR | Status: DC
  Administered 2012-03-15: 500 mg via INTRAMUSCULAR

## 2012-03-15 MED ORDER — HYDROCOD POLST-CHLORPHEN POLST 10-8 MG/5ML PO LQCR: 5.0000 mL | Freq: Two times a day (BID) | ORAL | Status: DC

## 2012-03-15 MED ORDER — ALBUTEROL SULFATE (5 MG/ML) 0.5% IN NEBU
2.5000 mg | INHALATION_SOLUTION | Freq: Once | RESPIRATORY_TRACT | Status: AC
  Administered 2012-03-15: 2.5 mg via RESPIRATORY_TRACT

## 2012-03-15 MED ORDER — ALBUTEROL SULFATE HFA 108 (90 BASE) MCG/ACT IN AERS: 2.0000 | INHALATION_SPRAY | Freq: Four times a day (QID) | RESPIRATORY_TRACT | Status: DC | PRN

## 2012-03-15 NOTE — Patient Instructions (Addendum)
To have  Xray today  Call office Tuesday for report

## 2012-03-15 NOTE — Progress Notes (Signed)
Subjective:    Patient ID: Cristina Zimmerman, female    DOB: 11-Sep-1985, 26 y.o.   MRN: [id number]  HPI  Cristina Zimmerman is here for acute visit.  [facility] and given an augmenting for URI but she is not better  Still with nasal congestion two  Weeks later  She does have some wheezing and green nasal discharge  No fever.  Coughs frequently but nonproductive No chest pain  No Known Allergies Past Medical History  Diagnosis Date  . Turner syndrome   . Vitamin D deficiency   . NASH (nonalcoholic steatohepatitis)   . Hip dysplasia   . Hematuria   . Scoliosis     minimal  . Allergy   . Anxiety   . Hyperlipidemia   . Hypertension   . Osteoporosis    Past Surgical History  Procedure Date  . Tympanostomy tube placement   . Hernia repair     x2  . Bilateral oophorectomy   . Hip surgery   . Tonsillectomy    History   Social History  . Marital Status: Single    Spouse Name: N/A    Number of Children: N/A  . Years of Education: N/A   Occupational History  . Not on file.   Social History Main Topics  . Smoking status: Never Smoker   . Smokeless tobacco: Not on file  . Alcohol Use: Yes     Comment: socially  . Drug Use: No  . Sexually Active: No   Other Topics Concern  . Not on file   Social History Narrative  . No narrative on file   Family History  Problem Relation Age of Onset  . Hypertension Mother   . Arthritis Mother   . Fibromyalgia Mother   . Diabetes Father   . Diabetes Paternal Grandmother   . Stroke Paternal Grandmother   . Kidney disease Paternal Grandmother   . Diabetes Paternal Grandfather    Patient Active Problem List  Diagnosis  . Allergy  . Anxiety  . Turner syndrome  . Hip dysplasia  . Vitamin D deficiency  . Hematuria  . Hyperlipidemia  . Hypertension  . Osteoporosis  . NASH (nonalcoholic steatohepatitis)  . Scoliosis   Current Outpatient Prescriptions on File Prior to Visit  Medication Sig Dispense  Refill  . alendronate (FOSAMAX) 70 MG tablet Take 70 mg by mouth every 7 (seven) days. Take with a full glass of water on an empty stomach.       Marland Kitchen amLODipine (NORVASC) 5 MG tablet TAKE 1 TABLET EVERY DAY  90 tablet  1  . escitalopram (LEXAPRO) 10 MG tablet TAKE 1 TABLET (10 MG TOTAL) BY MOUTH DAILY.  30 tablet  5  . estrogens, conjugated, (PREMARIN) 1.25 MG tablet Take 1 tablet (1.25 mg total) by mouth daily.  30 tablet  2  . fexofenadine (ALLEGRA) 60 MG tablet Take 60 mg by mouth daily.        . fluticasone (FLONASE) 50 MCG/ACT nasal spray PLACE 1 SPRAY INTO THE NOSE DAILY.  16 g  0  . glucosamine-chondroitin 500-400 MG tablet Take 1 tablet by mouth 2 (two) times daily.        . medroxyPROGESTERone (PROVERA) 10 MG tablet Take 10 mg by mouth daily. 10 days per month       . Multiple Vitamin (MULTIVITAMIN) tablet Take 1 tablet by mouth daily.        . naproxen (NAPROSYN) 500 MG tablet  Take 1 tablet (500 mg total) by mouth 2 (two) times daily.  14 tablet  0  . OMEGA 3 1000 MG CAPS Take 1 capsule by mouth 2 (two) times daily.        . Vitamin D, Ergocalciferol, (DRISDOL) 50000 UNITS CAPS Take 50,000 Units by mouth every 7 (seven) days.        Marland Kitchen albuterol (PROVENTIL HFA;VENTOLIN HFA) 108 (90 BASE) MCG/ACT inhaler Take 2 inhalations 3 times a day  1 Inhaler  0  . chlorpheniramine-HYDROcodone (TUSSIONEX PENNKINETIC ER) 10-8 MG/5ML LQCR Take 5 mLs by mouth every 12 (twelve) hours.  140 mL  0   No current facility-administered medications on file prior to visit.      Review of Systems    see HPI Objective:   Physical Exam Physical Exam  Constitutional: She is oriented to person, place, and time. She appears well-developed and well-nourished. She is cooperative.  HENT:  Head: Normocephalic and atraumatic.  Right Ear: A middle ear effusion is present.  Left Ear: A middle ear effusion is present.  Nose: Mucosal edema present. Right sinus exhibits maxillary sinus tenderness. Left sinus exhibits  maxillary sinus tenderness.  Mouth/Throat: Posterior oropharyngeal erythema present.  Serous effusion bilaterally  Eyes: Conjunctivae and EOM are normal. Pupils are equal, round, and reactive to light.  Neck: Neck supple. Carotid bruit is not present. No mass present.  Cardiovascular: Regular rhythm, normal heart sounds, intact distal pulses and normal pulses. Exam reveals no gallop and no friction rub.  No murmur heard.  Pulmonary/Chest: Breath sounds normal. She has no wheezes. She has no rhonchi. She has no rales.  Neurological: She is alert and oriented to person, place, and time.  Skin: Skin is warm and dry. No abrasion, no bruising, no ecchymosis and no rash noted. No cyanosis. Nails show no clubbing.  Psychiatric: She has a normal mood and affect. Her speech is normal and behavior is normal.            Assessment & Plan:  Asthmatic bronchitis  Will give albuterol HHN  And she is to use albuterol 2 inhalations tid as outpt  Cough  Tussionex 1 tsp q12h  Bronchitis  Zpak  With wheezing will get CXR today  Sinusitis    Will get limited CT of sinuses  I counseled pt is she shows no improvement, will refer to ENT.  She voices understanding.

## 2012-03-16 ENCOUNTER — Telehealth: Payer: Self-pay | Admitting: *Deleted

## 2012-03-16 NOTE — Telephone Encounter (Signed)
Message copied by Mathews Robinsons on Tue Mar 16, 2012 10:47 AM ------      Message from: Raechel Chute D      Created: Tue Mar 16, 2012 10:33 AM       Karen Kitchens            Call Auriella and let her know that her CXR and sinus xrays are normal            Counsel just to call office if not better

## 2012-03-16 NOTE — Telephone Encounter (Signed)
Left message regarding -chest xray and CT

## 2012-04-04 ENCOUNTER — Other Ambulatory Visit: Payer: Self-pay | Admitting: Internal Medicine

## 2012-04-05 ENCOUNTER — Other Ambulatory Visit: Payer: Self-pay | Admitting: *Deleted

## 2012-04-05 NOTE — Telephone Encounter (Signed)
Refill request

## 2012-05-10 ENCOUNTER — Other Ambulatory Visit: Payer: Self-pay | Admitting: Internal Medicine

## 2012-05-10 DIAGNOSIS — Q969 Turner's syndrome, unspecified: Secondary | ICD-10-CM

## 2012-05-10 MED ORDER — FLUTICASONE PROPIONATE 50 MCG/ACT NA SUSP: 1.0000 | Freq: Every day | NASAL | Status: DC

## 2012-05-10 MED ORDER — ESTROGENS CONJUGATED 1.25 MG PO TABS: 1.2500 mg | ORAL_TABLET | Freq: Every day | ORAL | Status: DC

## 2012-05-10 MED ORDER — AMLODIPINE BESYLATE 5 MG PO TABS: 5.0000 mg | ORAL_TABLET | Freq: Every day | ORAL | Status: DC

## 2012-05-10 MED ORDER — ESCITALOPRAM OXALATE 10 MG PO TABS: 10.0000 mg | ORAL_TABLET | Freq: Every day | ORAL | Status: DC

## 2012-05-10 NOTE — Telephone Encounter (Signed)
Pt needs the following medications fill: amLODipine (NORVASC) 5 MG tablet estrogens, conjugated, (PREMARIN) 1.25 MG tablet fluticasone (FLONASE) 50 MCG/ACT nasal spray escitalopram (LEXAPRO) 10 MG tablet Pt needs medications sent to [facility].Marland KitchenMarland Kitchen   If there any questions please call cell [phone] or work [phone] and ask for Mozelle.Marland KitchenMarland Kitchen

## 2012-05-10 NOTE — Telephone Encounter (Signed)
Refill request

## 2012-07-20 ENCOUNTER — Telehealth: Payer: Self-pay | Admitting: Internal Medicine

## 2012-07-20 ENCOUNTER — Ambulatory Visit (INDEPENDENT_AMBULATORY_CARE_PROVIDER_SITE_OTHER): Payer: BC Managed Care – PPO | Admitting: Internal Medicine

## 2012-07-20 ENCOUNTER — Encounter: Payer: Self-pay | Admitting: Internal Medicine

## 2012-07-20 VITALS — BP 131/80 | HR 91 | Temp 97.1°F | Resp 18 | Wt 168.0 lb

## 2012-07-20 DIAGNOSIS — J9801 Acute bronchospasm: Secondary | ICD-10-CM

## 2012-07-20 DIAGNOSIS — J45909 Unspecified asthma, uncomplicated: Secondary | ICD-10-CM

## 2012-07-20 DIAGNOSIS — J209 Acute bronchitis, unspecified: Secondary | ICD-10-CM

## 2012-07-20 DIAGNOSIS — R062 Wheezing: Secondary | ICD-10-CM

## 2012-07-20 MED ORDER — ALBUTEROL SULFATE HFA 108 (90 BASE) MCG/ACT IN AERS: 2.0000 | INHALATION_SPRAY | Freq: Four times a day (QID) | RESPIRATORY_TRACT | Status: DC | PRN

## 2012-07-20 MED ORDER — METHYLPREDNISOLONE ACETATE 80 MG/ML IJ SUSP
120.0000 mg | Freq: Once | INTRAMUSCULAR | Status: AC
  Administered 2012-07-20: 120 mg via INTRAMUSCULAR

## 2012-07-20 MED ORDER — ALBUTEROL SULFATE (5 MG/ML) 0.5% IN NEBU
5.0000 mg | INHALATION_SOLUTION | Freq: Once | RESPIRATORY_TRACT | Status: AC
  Administered 2012-07-20: 5 mg via RESPIRATORY_TRACT

## 2012-07-20 MED ORDER — AZITHROMYCIN 250 MG PO TABS: ORAL_TABLET | ORAL | Status: DC

## 2012-07-20 MED ORDER — PREDNISONE 20 MG PO TABS: ORAL_TABLET | ORAL | Status: DC

## 2012-07-20 NOTE — Progress Notes (Signed)
Subjective:    Patient ID: Marny Lowenstein, female    DOB: 04-23-1985, 27 y.o.   MRN: [id number]  HPI Ahsha is here for acute visit.  Began with head cold symptoms a few days ago .  Now associated with dry cough, chest tightness and occasional wheezing.  No fever  She does give a history of childhood asthma and notes that this winter she has chest tightness when going out in the cold.  She has been using her albuterol daily last few weeks.    She does have alllergies in the spring season  No Known Allergies Past Medical History  Diagnosis Date  . Turner syndrome   . Vitamin D deficiency   . NASH (nonalcoholic steatohepatitis)   . Hip dysplasia   . Hematuria   . Scoliosis     minimal  . Allergy   . Anxiety   . Hyperlipidemia   . Hypertension   . Osteoporosis   . Thyroid disease    Past Surgical History  Procedure Laterality Date  . Tympanostomy tube placement    . Hernia repair      x2  . Bilateral oophorectomy    . Hip surgery    . Tonsillectomy     History   Social History  . Marital Status: Single    Spouse Name: N/A    Number of Children: N/A  . Years of Education: N/A   Occupational History  . Not on file.   Social History Main Topics  . Smoking status: Never Smoker   . Smokeless tobacco: Not on file  . Alcohol Use: Yes     Comment: socially  . Drug Use: No  . Sexually Active: No   Other Topics Concern  . Not on file   Social History Narrative  . No narrative on file   Family History  Problem Relation Age of Onset  . Hypertension Mother   . Arthritis Mother   . Fibromyalgia Mother   . Diabetes Father   . Diabetes Paternal Grandmother   . Stroke Paternal Grandmother   . Kidney disease Paternal Grandmother   . Diabetes Paternal Grandfather    Patient Active Problem List  Diagnosis  . Allergy  . Anxiety  . Turner syndrome  . Hip dysplasia  . Vitamin D deficiency  . Hematuria  . Hyperlipidemia  . Hypertension  . Osteoporosis  . NASH  (nonalcoholic steatohepatitis)  . Scoliosis  . Childhood asthma  . Bronchospasm   Current Outpatient Prescriptions on File Prior to Visit  Medication Sig Dispense Refill  . albuterol (PROVENTIL HFA;VENTOLIN HFA) 108 (90 BASE) MCG/ACT inhaler Inhale 2 puffs into the lungs every 6 (six) hours as needed for wheezing.  1 Inhaler  0  . amLODipine (NORVASC) 5 MG tablet Take 1 tablet (5 mg total) by mouth daily.  90 tablet  3  . escitalopram (LEXAPRO) 10 MG tablet Take 1 tablet (10 mg total) by mouth daily.  30 tablet  11  . estrogens, conjugated, (PREMARIN) 1.25 MG tablet Take 1 tablet (1.25 mg total) by mouth daily.  30 tablet  5  . fluticasone (FLONASE) 50 MCG/ACT nasal spray Place 1 spray into the nose daily.  16 g  1  . glucosamine-chondroitin 500-400 MG tablet Take 1 tablet by mouth 2 (two) times daily.        . medroxyPROGESTERone (PROVERA) 10 MG tablet Take 10 mg by mouth daily. 10 days per month       . Multiple Vitamin (MULTIVITAMIN)  tablet Take 1 tablet by mouth daily.        . naproxen (NAPROSYN) 500 MG tablet Take 1 tablet (500 mg total) by mouth 2 (two) times daily.  14 tablet  0  . OMEGA 3 1000 MG CAPS Take 1 capsule by mouth 2 (two) times daily.        . Vitamin D, Ergocalciferol, (DRISDOL) 50000 UNITS CAPS Take 50,000 Units by mouth every 7 (seven) days.        . fexofenadine (ALLEGRA) 60 MG tablet Take 60 mg by mouth daily.        Marland Kitchen guaiFENesin (MUCINEX) 600 MG 12 hr tablet Take 1,200 mg by mouth 2 (two) times daily.       No current facility-administered medications on file prior to visit.       Review of Systems See HPI    Objective:   Physical Exam Physical Exam  Nursing note and vitals reviewed.  Peak flow in office 350 Constitutional: She is oriented to person, place, and time. She appears well-developed and well-nourished. She is cooperative.  HENT:  Head: Normocephalic and atraumatic.  Nose: Mucosal edema present.  Eyes: Conjunctivae and EOM are normal. Pupils  are equal, round, and reactive to light.  Neck: Neck supple.  Cardiovascular: Regular rhythm, normal heart sounds, intact distal pulses and normal pulses. Exam reveals no gallop and no friction rub.  No murmur heard.  Pulmonary/Chest: She has end expiratory wheezing. . She has no rales or rhonchii.  Neurological: She is alert and oriented to person, place, and time.  Skin: Skin is warm and dry. No abrasion, no bruising, no ecchymosis and no rash noted. No cyanosis. Nails show no clubbing.  Psychiatric: She has a normal mood and affect. Her speech is normal and behavior is normal.            Assessment & Plan:  Bronchospasm  HIstory of childhood asthma:    Will give HHN albuterol and depomedrol 120 mg in office.  Rx Advair 100/50 one inhalation bid sample given in office.  Will give prednsione 60 mg taper.    Allergic rhinitis  Ok for antihistamine daily  See me in 48 hours.

## 2012-07-20 NOTE — Patient Instructions (Addendum)
See me in 2 days

## 2012-07-22 ENCOUNTER — Ambulatory Visit (INDEPENDENT_AMBULATORY_CARE_PROVIDER_SITE_OTHER): Payer: BC Managed Care – PPO | Admitting: Internal Medicine

## 2012-07-22 ENCOUNTER — Encounter: Payer: Self-pay | Admitting: Internal Medicine

## 2012-07-22 ENCOUNTER — Telehealth: Payer: Self-pay | Admitting: Internal Medicine

## 2012-07-22 VITALS — BP 115/73 | HR 93 | Temp 98.1°F | Resp 18 | Wt 168.0 lb

## 2012-07-22 DIAGNOSIS — Z87448 Personal history of other diseases of urinary system: Secondary | ICD-10-CM

## 2012-07-22 DIAGNOSIS — J9801 Acute bronchospasm: Secondary | ICD-10-CM

## 2012-07-22 DIAGNOSIS — E039 Hypothyroidism, unspecified: Secondary | ICD-10-CM | POA: Insufficient documentation

## 2012-07-22 LAB — POCT URINALYSIS DIPSTICK
Ketones, UA: NEGATIVE
Protein, UA: NEGATIVE
Spec Grav, UA: 1.02
Urobilinogen, UA: NEGATIVE
pH, UA: 6.5

## 2012-07-22 MED ORDER — METHYLPREDNISOLONE ACETATE 80 MG/ML IJ SUSP
80.0000 mg | Freq: Once | INTRAMUSCULAR | Status: AC
  Administered 2012-07-22: 80 mg via INTRAMUSCULAR

## 2012-07-22 MED ORDER — FLUTICASONE-SALMETEROL 100-50 MCG/DOSE IN AEPB: INHALATION_SPRAY | RESPIRATORY_TRACT | Status: DC

## 2012-07-22 NOTE — Progress Notes (Signed)
Subjective:    Patient ID: Cristina Zimmerman, female    DOB: 1985-08-15, 27 y.o.   MRN: [id number]  HPI Cristina Zimmerman is here for follow up for bronchitis with bronchospasm.   She is feeling much better but Prednisone make her jittery and she does not like it.    Less cough less SOB  Lots of nasal drainage    No Known Allergies Past Medical History  Diagnosis Date  . Turner syndrome   . Vitamin D deficiency   . NASH (nonalcoholic steatohepatitis)   . Hip dysplasia   . Hematuria   . Scoliosis     minimal  . Allergy   . Anxiety   . Hyperlipidemia   . Hypertension   . Osteoporosis   . Thyroid disease    Past Surgical History  Procedure Laterality Date  . Tympanostomy tube placement    . Hernia repair      x2  . Bilateral oophorectomy    . Hip surgery    . Tonsillectomy     History   Social History  . Marital Status: Single    Spouse Name: N/A    Number of Children: N/A  . Years of Education: N/A   Occupational History  . Not on file.   Social History Main Topics  . Smoking status: Never Smoker   . Smokeless tobacco: Not on file  . Alcohol Use: Yes     Comment: socially  . Drug Use: No  . Sexually Active: No   Other Topics Concern  . Not on file   Social History Narrative  . No narrative on file   Family History  Problem Relation Age of Onset  . Hypertension Mother   . Arthritis Mother   . Fibromyalgia Mother   . Diabetes Father   . Diabetes Paternal Grandmother   . Stroke Paternal Grandmother   . Kidney disease Paternal Grandmother   . Diabetes Paternal Grandfather    Patient Active Problem List  Diagnosis  . Allergy  . Anxiety  . Turner syndrome  . Hip dysplasia  . Vitamin D deficiency  . Hematuria  . Hyperlipidemia  . Hypertension  . Osteoporosis  . NASH (nonalcoholic steatohepatitis)  . Scoliosis  . Childhood asthma  . Bronchospasm   Current Outpatient Prescriptions on File Prior to Visit  Medication Sig Dispense Refill  . albuterol  (PROVENTIL HFA;VENTOLIN HFA) 108 (90 BASE) MCG/ACT inhaler Inhale 2 puffs into the lungs every 6 (six) hours as needed for wheezing.  1 Inhaler  0  . amLODipine (NORVASC) 5 MG tablet Take 1 tablet (5 mg total) by mouth daily.  90 tablet  3  . azithromycin (ZITHROMAX) 250 MG tablet Take as directed  6 tablet  0  . escitalopram (LEXAPRO) 10 MG tablet Take 1 tablet (10 mg total) by mouth daily.  30 tablet  11  . estrogens, conjugated, (PREMARIN) 1.25 MG tablet Take 1 tablet (1.25 mg total) by mouth daily.  30 tablet  5  . fexofenadine (ALLEGRA) 60 MG tablet Take 60 mg by mouth daily.        Marland Kitchen glucosamine-chondroitin 500-400 MG tablet Take 1 tablet by mouth 2 (two) times daily.        Marland Kitchen guaiFENesin (MUCINEX) 600 MG 12 hr tablet Take 1,200 mg by mouth 2 (two) times daily.      Marland Kitchen levothyroxine (SYNTHROID, LEVOTHROID) 100 MCG tablet Take 100 mcg by mouth daily.      . naproxen (NAPROSYN) 500 MG tablet Take  1 tablet (500 mg total) by mouth 2 (two) times daily.  14 tablet  0  . OMEGA 3 1000 MG CAPS Take 1 capsule by mouth 2 (two) times daily.        . predniSONE (DELTASONE) 20 MG tablet Take 3 tablets daily for 3 days then 2 tablets for 3 days then 1 tablet for 3 days then stop  18 tablet  0  . Vitamin D, Ergocalciferol, (DRISDOL) 50000 UNITS CAPS Take 50,000 Units by mouth every 7 (seven) days.        . fluticasone (FLONASE) 50 MCG/ACT nasal spray Place 1 spray into the nose daily.  16 g  1  . medroxyPROGESTERone (PROVERA) 10 MG tablet Take 10 mg by mouth daily. 10 days per month       . Multiple Vitamin (MULTIVITAMIN) tablet Take 1 tablet by mouth daily.         No current facility-administered medications on file prior to visit.       Review of Systems    see HPI Objective:   Physical Exam  Physical Exam  Nursing note and vitals reviewed.  Constitutional: She is oriented to person, place, and time. She appears well-developed and well-nourished.  HENT:  Head: Normocephalic and atraumatic.   Cardiovascular: Normal rate and regular rhythm. Exam reveals no gallop and no friction rub.  No murmur heard.  Pulmonary/Chest: Breath sounds normal. She has no wheezes. She has no rales. Moving air much better Neurological: She is alert and oriented to person, place, and time.  Skin: Skin is warm and dry.  Psychiatric: She has a normal mood and affect. Her behavior is normal.          Assessment & Plan:  Bronchitis with bronchospasm  Cannot tolerate oral prednisone.  Will give depomedrol 80 mg im today . Will increase Advair to 250/50 i inhalation bid  Sample given  Allergic rhinitis  Ok for claritin D or allegra D   Hypothyroidsim  Continue  sevothyroxine per Dr. Tamala Julian  Hematuria  Will recheck U/A today  See me in 4 weeks or sooner as needed

## 2012-07-22 NOTE — Patient Instructions (Addendum)
See me in 4 weeks 

## 2012-08-19 ENCOUNTER — Telehealth: Payer: Self-pay | Admitting: Internal Medicine

## 2012-08-19 ENCOUNTER — Ambulatory Visit: Payer: BC Managed Care – PPO | Admitting: Internal Medicine

## 2012-08-19 NOTE — Telephone Encounter (Signed)
Pt would like lab results for urinalysis.  Call back phone number [phone].

## 2013-08-06 ENCOUNTER — Other Ambulatory Visit: Payer: Self-pay | Admitting: Internal Medicine

## 2013-08-08 NOTE — Telephone Encounter (Signed)
Sent in 30 days worth of medication, notified pt of need for an appt before refilling more.

## 2013-08-10 ENCOUNTER — Ambulatory Visit: Payer: BC Managed Care – PPO | Admitting: Internal Medicine

## 2013-08-15 ENCOUNTER — Ambulatory Visit (INDEPENDENT_AMBULATORY_CARE_PROVIDER_SITE_OTHER): Payer: BC Managed Care – HMO | Admitting: Internal Medicine

## 2013-08-15 ENCOUNTER — Encounter: Payer: Self-pay | Admitting: Internal Medicine

## 2013-08-15 VITALS — BP 123/83 | HR 106 | Temp 98.1°F | Resp 18

## 2013-08-15 DIAGNOSIS — Q969 Turner's syndrome, unspecified: Secondary | ICD-10-CM

## 2013-08-15 DIAGNOSIS — E785 Hyperlipidemia, unspecified: Secondary | ICD-10-CM

## 2013-08-15 DIAGNOSIS — I10 Essential (primary) hypertension: Secondary | ICD-10-CM

## 2013-08-15 DIAGNOSIS — J45909 Unspecified asthma, uncomplicated: Secondary | ICD-10-CM | POA: Diagnosis not present

## 2013-08-15 DIAGNOSIS — E039 Hypothyroidism, unspecified: Secondary | ICD-10-CM

## 2013-08-15 DIAGNOSIS — F411 Generalized anxiety disorder: Secondary | ICD-10-CM

## 2013-08-15 DIAGNOSIS — F419 Anxiety disorder, unspecified: Secondary | ICD-10-CM

## 2013-08-15 MED ORDER — FLUTICASONE PROPIONATE 50 MCG/ACT NA SUSP: NASAL | Status: DC

## 2013-08-15 MED ORDER — FLUTICASONE-SALMETEROL 100-50 MCG/DOSE IN AEPB: INHALATION_SPRAY | RESPIRATORY_TRACT | Status: DC

## 2013-08-15 MED ORDER — LEVOTHYROXINE SODIUM 100 MCG PO TABS: 100.0000 ug | ORAL_TABLET | Freq: Every day | ORAL | Status: DC

## 2013-08-15 MED ORDER — ALBUTEROL SULFATE HFA 108 (90 BASE) MCG/ACT IN AERS: 2.0000 | INHALATION_SPRAY | Freq: Four times a day (QID) | RESPIRATORY_TRACT | Status: DC | PRN

## 2013-08-15 NOTE — Patient Instructions (Signed)
Will set up referral to reproductive endocrinologist   See me in 3 weeks 30 mins

## 2013-08-15 NOTE — Progress Notes (Signed)
Subjective:    Patient ID: Cristina Zimmerman, female    DOB: Oct 28, 1985, 28 y.o.   MRN: [id number]  HPI Cristina Zimmerman is here for follow up.    I have not seen her in a while.    She tells me that she lost her job several months ago and lost her insurance and has been trying to extend her medication.  She has been out of her thyroid and multiple meds for the last six months.    She has been taking her amlodipine and her lexapro   She would like to see a reproductive endocrinologist that is experienced with Turner syndrome  Allergies  Allergen Reactions  . Prednisone     Jittery intolerance   Past Medical History  Diagnosis Date  . Turner syndrome   . Vitamin D deficiency   . NASH (nonalcoholic steatohepatitis)   . Hip dysplasia   . Hematuria   . Scoliosis     minimal  . Allergy   . Anxiety   . Hyperlipidemia   . Hypertension   . Osteoporosis   . Thyroid disease    Past Surgical History  Procedure Laterality Date  . Tympanostomy tube placement    . Hernia repair      x2  . Bilateral oophorectomy    . Hip surgery    . Tonsillectomy     History   Social History  . Marital Status: Single    Spouse Name: N/A    Number of Children: N/A  . Years of Education: N/A   Occupational History  . Not on file.   Social History Main Topics  . Smoking status: Never Smoker   . Smokeless tobacco: Not on file  . Alcohol Use: Yes     Comment: socially  . Drug Use: No  . Sexual Activity: No   Other Topics Concern  . Not on file   Social History Narrative  . No narrative on file   Family History  Problem Relation Age of Onset  . Hypertension Mother   . Arthritis Mother   . Fibromyalgia Mother   . Diabetes Father   . Diabetes Paternal Grandmother   . Stroke Paternal Grandmother   . Kidney disease Paternal Grandmother   . Diabetes Paternal Grandfather    Patient Active Problem List   Diagnosis Date Noted  . Hypothyroidism 07/22/2012  . Childhood asthma 07/20/2012  .  Bronchospasm 07/20/2012  . Allergy   . Anxiety   . Turner syndrome   . Hip dysplasia   . Vitamin D deficiency   . Hematuria   . Hyperlipidemia   . Hypertension   . Osteoporosis   . NASH (nonalcoholic steatohepatitis)   . Scoliosis    Current Outpatient Prescriptions on File Prior to Visit  Medication Sig Dispense Refill  . amLODipine (NORVASC) 5 MG tablet TAKE 1 TABLET BY MOUTH EVERY DAY  30 tablet  1  . escitalopram (LEXAPRO) 10 MG tablet TAKE 1 TABLET BY MOUTH DAILY  30 tablet  0  . levothyroxine (SYNTHROID, LEVOTHROID) 100 MCG tablet Take 100 mcg by mouth daily.      . Multiple Vitamin (MULTIVITAMIN) tablet Take 1 tablet by mouth daily.        . OMEGA 3 1000 MG CAPS Take 1 capsule by mouth 2 (two) times daily.        Marland Kitchen albuterol (PROVENTIL HFA;VENTOLIN HFA) 108 (90 BASE) MCG/ACT inhaler Inhale 2 puffs into the lungs every 6 (six) hours as needed for  wheezing.  1 Inhaler  0  . fexofenadine (ALLEGRA) 60 MG tablet Take 60 mg by mouth daily.        . fluticasone (FLONASE) 50 MCG/ACT nasal spray Place 1 spray into the nose daily.  16 g  1  . Fluticasone-Salmeterol (ADVAIR DISKUS) 100-50 MCG/DOSE AEPB Inhale 1 puff into lungs bid  60 each  1  . medroxyPROGESTERone (PROVERA) 10 MG tablet Take 10 mg by mouth daily. 10 days per month       . PREMARIN 1.25 MG tablet TAKE 1 TABLET BY MOUTH DAILY  30 tablet  0   No current facility-administered medications on file prior to visit.       Review of Systems    see hPi  Objective:   Physical Exam   Physical Exam  Nursing note and vitals reviewed.   Peak flow 380 today Constitutional: She is oriented to person, place, and time. She appears well-developed and well-nourished.  HENT:  Head: Normocephalic and atraumatic.  Cardiovascular: Normal rate and regular rhythm. Exam reveals no gallop and no friction rub.  No murmur heard.  Pulmonary/Chest: Breath sounds normal. She has no wheezes. She has no rales.  Neurological: She is alert and  oriented to person, place, and time.  Skin: Skin is warm and dry.  Psychiatric: She has a normal mood and affect. Her behavior is normal.        Assessment & Plan:  Hypertension  Continue amlodipine  Will check all labs today  hypothyrodsim  Will re-order  Synthroid at former dose.    Check TSH today  Hyperlipidemia  Check today  Asthma/bronchospasm peak flow good. Will re-order  Advair  Allergic rhinitis  flonase ok for 4 weeks only  OTC antihistamine of choice  Turner syndrome  :  Will refer to reproductive endocrinologist

## 2013-08-15 NOTE — Addendum Note (Signed)
Addended by: Raechel ChuteSCHOENHOFF, Kanita Delage D on: 08/15/2013 11:28 AM   Modules accepted: Orders

## 2013-08-16 LAB — CBC WITH DIFFERENTIAL/PLATELET
Basophils Absolute: 0 10*3/uL (ref 0.0–0.1)
Basophils Relative: 0 % (ref 0–1)
Eosinophils Absolute: 0.1 10*3/uL (ref 0.0–0.7)
Eosinophils Relative: 1 % (ref 0–5)
HCT: 41.3 % (ref 36.0–46.0)
HEMOGLOBIN: 14.4 g/dL (ref 12.0–15.0)
LYMPHS ABS: 2.4 10*3/uL (ref 0.7–4.0)
Lymphocytes Relative: 40 % (ref 12–46)
MCH: 29.7 pg (ref 26.0–34.0)
MCHC: 34.9 g/dL (ref 30.0–36.0)
MCV: 85.2 fL (ref 78.0–100.0)
MONOS PCT: 6 % (ref 3–12)
Monocytes Absolute: 0.4 10*3/uL (ref 0.1–1.0)
NEUTROS ABS: 3.2 10*3/uL (ref 1.7–7.7)
NEUTROS PCT: 53 % (ref 43–77)
Platelets: 257 10*3/uL (ref 150–400)
RBC: 4.85 MIL/uL (ref 3.87–5.11)
RDW: 14.8 % (ref 11.5–15.5)
WBC: 6.1 10*3/uL (ref 4.0–10.5)

## 2013-08-16 LAB — LIPID PANEL
CHOL/HDL RATIO: 6.1 ratio
CHOLESTEROL: 267 mg/dL — AB (ref 0–200)
HDL: 44 mg/dL (ref >39)
LDL Cholesterol: 197 mg/dL — ABNORMAL HIGH (ref 0–99)
TRIGLYCERIDES: 128 mg/dL (ref <150)
VLDL: 26 mg/dL (ref 0–40)

## 2013-08-16 LAB — T4, FREE: Free T4: 1.12 ng/dL (ref 0.80–1.80)

## 2013-08-16 LAB — TSH: TSH: 6.407 u[IU]/mL — ABNORMAL HIGH (ref 0.350–4.500)

## 2013-08-16 LAB — T3, FREE: T3, Free: 2.7 pg/mL (ref 2.3–4.2)

## 2013-08-16 LAB — VITAMIN D 25 HYDROXY (VIT D DEFICIENCY, FRACTURES): Vit D, 25-Hydroxy: 21 ng/mL — ABNORMAL LOW (ref 30–89)

## 2013-08-24 ENCOUNTER — Telehealth: Payer: Self-pay | Admitting: *Deleted

## 2013-08-24 NOTE — Telephone Encounter (Signed)
Called Cristina Zimmerman with results and reminded her to keep appointment on May 18; she agreed.

## 2013-08-24 NOTE — Telephone Encounter (Signed)
Message copied by Malena CatholicBAKER, Piero Mustard L on Wed Aug 24, 2013 10:27 AM ------      Message from: Raechel ChuteSCHOENHOFF, DEBORAH D      Created: Wed Aug 17, 2013 10:20 AM       Cristina Zimmerman             Call pt and let her know that her bad cholesterol is very high OK to tell her any results.  Advise her to be sure to keep follow up appt with me and will discuss furhter  Ok to mail labs to her ------

## 2013-09-05 ENCOUNTER — Encounter: Payer: Self-pay | Admitting: Internal Medicine

## 2013-09-05 ENCOUNTER — Ambulatory Visit (INDEPENDENT_AMBULATORY_CARE_PROVIDER_SITE_OTHER): Payer: BC Managed Care – HMO | Admitting: Internal Medicine

## 2013-09-05 VITALS — BP 105/71 | HR 92 | Temp 97.9°F | Resp 18 | Ht 62.0 in | Wt 170.0 lb

## 2013-09-05 DIAGNOSIS — E039 Hypothyroidism, unspecified: Secondary | ICD-10-CM

## 2013-09-05 DIAGNOSIS — Q969 Turner's syndrome, unspecified: Secondary | ICD-10-CM

## 2013-09-05 DIAGNOSIS — I10 Essential (primary) hypertension: Secondary | ICD-10-CM

## 2013-09-05 DIAGNOSIS — E785 Hyperlipidemia, unspecified: Secondary | ICD-10-CM

## 2013-09-05 MED ORDER — AZITHROMYCIN 250 MG PO TABS: ORAL_TABLET | ORAL | Status: DC

## 2013-09-05 NOTE — Progress Notes (Signed)
Subjective:    Patient ID: Cristina Zimmerman, female    DOB: October 21, 1985, 28 y.o.   MRN: [id number]  HPI Cristina Zimmerman is here for follow up.  She is taking her thyroid meds now.    See lipids.    I had long discussion and counseling session with risk/benefit of statin [place].  Cristina Zimmerman repeatedly says she does not want to take any meds now.  Not sure of FH of hyperlipidemia  Her appt with Reproductive endocrinology  Is pending.  She does not have any ovaries.     Allergies  Allergen Reactions  . Prednisone     Jittery intolerance   Past Medical History  Diagnosis Date  . Turner syndrome   . Vitamin D deficiency   . NASH (nonalcoholic steatohepatitis)   . Hip dysplasia   . Hematuria   . Scoliosis     minimal  . Allergy   . Anxiety   . Hyperlipidemia   . Hypertension   . Osteoporosis   . Thyroid disease    Past Surgical History  Procedure Laterality Date  . Tympanostomy tube placement    . Hernia repair      x2  . Bilateral oophorectomy    . Hip surgery    . Tonsillectomy     History   Social History  . Marital Status: Single    Spouse Name: N/A    Number of Children: N/A  . Years of Education: N/A   Occupational History  . Not on file.   Social History Main Topics  . Smoking status: Never Smoker   . Smokeless tobacco: Not on file  . Alcohol Use: Yes     Comment: socially  . Drug Use: No  . Sexual Activity: No   Other Topics Concern  . Not on file   Social History Narrative  . No narrative on file   Family History  Problem Relation Age of Onset  . Hypertension Mother   . Arthritis Mother   . Fibromyalgia Mother   . Diabetes Father   . Diabetes Paternal Grandmother   . Stroke Paternal Grandmother   . Kidney disease Paternal Grandmother   . Diabetes Paternal Grandfather    Patient Active Problem List   Diagnosis Date Noted  . Hypothyroidism 07/22/2012  . Childhood asthma 07/20/2012  . Bronchospasm 07/20/2012  . Allergy   . Anxiety   . Turner syndrome    . Hip dysplasia   . Vitamin D deficiency   . Hematuria   . Hyperlipidemia   . Hypertension   . Osteoporosis   . NASH (nonalcoholic steatohepatitis)   . Scoliosis    Current Outpatient Prescriptions on File Prior to Visit  Medication Sig Dispense Refill  . albuterol (PROVENTIL HFA;VENTOLIN HFA) 108 (90 BASE) MCG/ACT inhaler Inhale 2 puffs into the lungs every 6 (six) hours as needed for wheezing.  1 Inhaler  0  . amLODipine (NORVASC) 5 MG tablet TAKE 1 TABLET BY MOUTH EVERY DAY  30 tablet  1  . escitalopram (LEXAPRO) 10 MG tablet TAKE 1 TABLET BY MOUTH DAILY  30 tablet  0  . medroxyPROGESTERone (PROVERA) 10 MG tablet Take 10 mg by mouth daily. 10 days per month       . Multiple Vitamin (MULTIVITAMIN) tablet Take 1 tablet by mouth daily.        . fexofenadine (ALLEGRA) 60 MG tablet Take 60 mg by mouth daily.        . fluticasone (FLONASE) 50 MCG/ACT nasal  spray One spray each nostril daily  16 g  0  . Fluticasone-Salmeterol (ADVAIR DISKUS) 100-50 MCG/DOSE AEPB Inhale 1 puff into lungs bid  60 each  1  . levothyroxine (SYNTHROID, LEVOTHROID) 100 MCG tablet Take 1 tablet (100 mcg total) by mouth daily.  30 tablet  1  . OMEGA 3 1000 MG CAPS Take 1 capsule by mouth 2 (two) times daily.        Marland Kitchen PREMARIN 1.25 MG tablet TAKE 1 TABLET BY MOUTH DAILY  30 tablet  0   No current facility-administered medications on file prior to visit.      Review of Systems See HPI    Objective:   Physical Exam  Physical Exam  Nursing note and vitals reviewed.  Constitutional: She is oriented to person, place, and time. She appears well-developed and well-nourished.  HENT:  Head: Normocephalic and atraumatic.  Cardiovascular: Normal rate and regular rhythm. Exam reveals no gallop and no friction rub.  No murmur heard.  Pulmonary/Chest: Breath sounds normal. She has no wheezes. She has no rales.  Neurological: She is alert and oriented to person, place, and time.  Skin: Skin is warm and dry.    Psychiatric: She has a normal mood and affect. Her behavior is normal.        Assessment & Plan:  Hypothyroidism  Continue synthroid  HTN :  Continue amlodipine  Hyperlipidemia:  Multiple times pt declines statin  . She wishes to try diet.   Will recheck in the next few months.    Turner syndrome  :  appt pending with reproductive  endocrinology

## 2013-09-05 NOTE — Patient Instructions (Signed)
Schedule CPE

## 2013-09-13 ENCOUNTER — Other Ambulatory Visit: Payer: Self-pay | Admitting: Internal Medicine

## 2013-09-15 ENCOUNTER — Other Ambulatory Visit: Payer: Self-pay | Admitting: *Deleted

## 2013-09-15 ENCOUNTER — Telehealth: Payer: Self-pay | Admitting: *Deleted

## 2013-09-15 NOTE — Telephone Encounter (Signed)
Refill request

## 2013-09-15 NOTE — Telephone Encounter (Signed)
Needs refill on her escitalopram.

## 2013-09-20 ENCOUNTER — Other Ambulatory Visit: Payer: Self-pay | Admitting: Internal Medicine

## 2013-09-28 ENCOUNTER — Other Ambulatory Visit: Payer: Self-pay | Admitting: *Deleted

## 2013-09-28 MED ORDER — ESCITALOPRAM OXALATE 10 MG PO TABS: 10.0000 mg | ORAL_TABLET | Freq: Every day | ORAL | Status: DC

## 2013-09-28 NOTE — Telephone Encounter (Signed)
Refill request

## 2013-09-29 ENCOUNTER — Emergency Department (INDEPENDENT_AMBULATORY_CARE_PROVIDER_SITE_OTHER)
Admission: EM | Admit: 2013-09-29 | Discharge: 2013-09-29 | Disposition: A | Discharge status: Home / Self Care | Payer: BC Managed Care – HMO | Source: Home / Self Care | Attending: Emergency Medicine | Admitting: Emergency Medicine

## 2013-09-29 ENCOUNTER — Other Ambulatory Visit: Payer: Self-pay | Admitting: *Deleted

## 2013-09-29 ENCOUNTER — Encounter: Payer: Self-pay | Admitting: Emergency Medicine

## 2013-09-29 DIAGNOSIS — H6692 Otitis media, unspecified, left ear: Secondary | ICD-10-CM

## 2013-09-29 DIAGNOSIS — H6690 Otitis media, unspecified, unspecified ear: Secondary | ICD-10-CM

## 2013-09-29 DIAGNOSIS — J0100 Acute maxillary sinusitis, unspecified: Secondary | ICD-10-CM

## 2013-09-29 MED ORDER — AZITHROMYCIN 250 MG PO TABS: ORAL_TABLET | ORAL | Status: DC

## 2013-09-29 NOTE — Telephone Encounter (Signed)
Pt calls stating that she has left sided facial pain, ear pain and sore throat. Advised pt to be seen at [facility]. Will follow up with pt on Monday.pt also needs refill on norvasc

## 2013-09-29 NOTE — ED Provider Notes (Signed)
CSN: [id number]     Arrival date & time 09/29/13  1222 History   First MD Initiated Contact with Patient 09/29/13 1224     Chief Complaint  Patient presents with  . Otalgia   Left earache x 1 week, scratchy throat  HPI URI HISTORY  Cristina Zimmerman is a 28 y.o. female who complains of onset of cold symptoms for 8 days. Progress to left earache past week.  Have been using over-the-counter treatment which helps a little bit.  + chills/sweats +  Fever  +  Nasal congestion + mild Discolored Post-nasal drainage No sinus pain/pressure No sore throat  Minimal cough, nonproductive. +hoarse No wheezing No chest congestion No hemoptysis No shortness of breath No pleuritic pain  No itchy/red eyes + earache L>>R  No nausea No vomiting No abdominal pain No diarrhea  No skin rashes +  Fatigue No myalgias + mild headache , no focal neurologic symptoms  Past Medical History  Diagnosis Date  . Turner syndrome   . Vitamin D deficiency   . NASH (nonalcoholic steatohepatitis)   . Hip dysplasia   . Hematuria   . Scoliosis     minimal  . Allergy   . Anxiety   . Hyperlipidemia   . Hypertension   . Osteoporosis   . Thyroid disease    Past Surgical History  Procedure Laterality Date  . Tympanostomy tube placement    . Hernia repair      x2  . Bilateral oophorectomy    . Hip surgery    . Tonsillectomy     Family History  Problem Relation Age of Onset  . Hypertension Mother   . Arthritis Mother   . Fibromyalgia Mother   . Diabetes Father   . Diabetes Paternal Grandmother   . Stroke Paternal Grandmother   . Kidney disease Paternal Grandmother   . Diabetes Paternal Grandfather    History  Substance Use Topics  . Smoking status: Never Smoker   . Smokeless tobacco: Not on file  . Alcohol Use: Yes     Comment: socially   OB History   Grav Para Term Preterm Abortions TAB SAB Ect Mult Living                 Review of Systems  All other systems reviewed and are  negative.   Allergies  Prednisone  Home Medications   Prior to Admission medications   Medication Sig Start Date End Date Taking? Authorizing Provider  albuterol (PROVENTIL HFA;VENTOLIN HFA) 108 (90 BASE) MCG/ACT inhaler Inhale 2 puffs into the lungs every 6 (six) hours as needed for wheezing. 08/15/13   Kendrick Rancheborah D Schoenhoff, MD  amLODipine (NORVASC) 5 MG tablet TAKE 1 TABLET BY MOUTH EVERY DAY    Kendrick Rancheborah D Schoenhoff, MD  azithromycin (ZITHROMAX Z-PAK) 250 MG tablet Take 2 tablets on day one, then 1 tablet daily on days 2 through 5 09/29/13   Lajean Manesavid Massey, MD  azithromycin [facility](ZITHROMAX) 250 MG tablet Take as directed 09/05/13   Kendrick Rancheborah D Schoenhoff, MD  escitalopram (LEXAPRO) 10 MG tablet Take 1 tablet (10 mg total) by mouth daily. 09/28/13   Kendrick Rancheborah D Schoenhoff, MD  fexofenadine (ALLEGRA) 60 MG tablet Take 60 mg by mouth daily.      Historical Provider, MD  fluticasone Aleda Grana(FLONASE) 50 MCG/ACT nasal spray One spray each nostril daily 08/15/13   Kendrick Rancheborah D Schoenhoff, MD  Fluticasone-Salmeterol (ADVAIR DISKUS) 100-50 MCG/DOSE AEPB Inhale 1 puff into lungs bid 08/15/13   Kendrick Rancheborah D Schoenhoff, MD  levothyroxine (SYNTHROID, LEVOTHROID) 100 MCG tablet Take 1 tablet (100 mcg total) by mouth daily. 08/15/13   Kendrick Ranch, MD  medroxyPROGESTERone (PROVERA) 10 MG tablet Take 10 mg by mouth daily. 10 days per month     Historical Provider, MD  Multiple Vitamin (MULTIVITAMIN) tablet Take 1 tablet by mouth daily.      Historical Provider, MD  OMEGA 3 1000 MG CAPS Take 1 capsule by mouth 2 (two) times daily.      Historical Provider, MD  PREMARIN 1.25 MG tablet TAKE 1 TABLET BY MOUTH DAILY    Kendrick Ranch, MD   BP 117/84  Pulse 107  Temp(Src) 98.2 F (36.8 C) (Oral)  Ht 5\' 2"  (1.575 m)  Wt 173 lb (78.472 kg)  BMI 31.63 kg/m2  SpO2 97% Physical Exam  Nursing note and vitals reviewed. Constitutional: She is oriented to person, place, and time. She appears well-developed and well-nourished.  No distress.  HENT:  Head: Normocephalic and atraumatic.  Right Ear: Tympanic membrane, external ear and ear canal normal.  Left Ear: External ear and ear canal normal. Tympanic membrane is erythematous. A middle ear effusion is present.  Nose: Mucosal edema and rhinorrhea present. Right sinus exhibits maxillary sinus tenderness. Left sinus exhibits maxillary sinus tenderness.  Mouth/Throat: Oropharynx is clear and moist. No oral lesions. No oropharyngeal exudate.  Eyes: Right eye exhibits no discharge. Left eye exhibits no discharge. No scleral icterus.  Neck: Neck supple.  Cardiovascular: Normal rate, regular rhythm and normal heart sounds.   Pulmonary/Chest: Effort normal and breath sounds normal. She has no wheezes. She has no rales.  Lymphadenopathy:    She has no cervical adenopathy.  Neurological: She is alert and oriented to person, place, and time.  Skin: Skin is warm and dry.    ED Course  Procedures (including critical care time) Labs Review Labs Reviewed - No data to display  Imaging Review No results found.   MDM   1. Left otitis media   2. Acute maxillary sinusitis    Treatment options discussed, as well as risks, benefits, alternatives. Patient voiced understanding and agreement with the following plans:  Z-Pak Flonase Other symptomatic care discussed  Follow-up with your primary care doctor in 5-7 days if not improving, or sooner if symptoms become worse. Precautions discussed. Red flags discussed. Questions invited and answered. Patient voiced understanding and agreement.     Lajean Manes, MD 09/29/13 [phone]

## 2013-09-29 NOTE — ED Notes (Signed)
Left earache x 1 week, scratchy throat

## 2013-10-01 MED ORDER — AMLODIPINE BESYLATE 5 MG PO TABS: 5.0000 mg | ORAL_TABLET | Freq: Every day | ORAL | Status: DC

## 2014-01-15 ENCOUNTER — Other Ambulatory Visit: Payer: Self-pay | Admitting: Internal Medicine

## 2014-01-24 ENCOUNTER — Encounter: Payer: BC Managed Care – HMO | Admitting: Internal Medicine

## 2014-01-24 DIAGNOSIS — Z0000 Encounter for general adult medical examination without abnormal findings: Secondary | ICD-10-CM

## 2014-01-24 NOTE — Progress Notes (Signed)
Subjective:    Patient ID: Cristina Zimmerman, female    DOB: [birth year], 28 y.o.   MRN: [id number]  HPI Last OV Hypothyroidism Continue synthroid  HTN : Continue amlodipine  Hyperlipidemia: Multiple times pt declines statin . She wishes to try diet. Will recheck in the next few months.  Turner syndrome : appt pending with reproductive endocrinology   Today    [name] is here for CPE  Allergies  Allergen Reactions  . Prednisone     Jittery intolerance   Past Medical History  Diagnosis Date  . Turner syndrome   . Vitamin D deficiency   . NASH (nonalcoholic steatohepatitis)   . Hip dysplasia   . Hematuria   . Scoliosis     minimal  . Allergy   . Anxiety   . Hyperlipidemia   . Hypertension   . Osteoporosis   . Thyroid disease    Past Surgical History  Procedure Laterality Date  . Tympanostomy tube placement    . Hernia repair      x2  . Bilateral oophorectomy    . Hip surgery    . Tonsillectomy     History   Social History  . Marital Status: Single    Spouse Name: N/A    Number of Children: N/A  . Years of Education: N/A   Occupational History  . Not on file.   Social History Main Topics  . Smoking status: Never Smoker   . Smokeless tobacco: Not on file  . Alcohol Use: Yes     Comment: socially  . Drug Use: No  . Sexual Activity: No   Other Topics Concern  . Not on file   Social History Narrative  . No narrative on file   Family History  Problem Relation Age of Onset  . Hypertension Mother   . Arthritis Mother   . Fibromyalgia Mother   . Diabetes Father   . Diabetes Paternal Grandmother   . Stroke Paternal Grandmother   . Kidney disease Paternal Grandmother   . Diabetes Paternal Grandfather    Patient Active Problem List   Diagnosis Date Noted  . Hypothyroidism 07/22/2012  . Childhood asthma 07/20/2012  . Bronchospasm 07/20/2012  . Allergy   . Anxiety   . Turner syndrome   . Hip dysplasia   . Vitamin D deficiency   . Hematuria   .  Hyperlipidemia   . Hypertension   . Osteoporosis   . NASH (nonalcoholic steatohepatitis)   . Scoliosis    Current Outpatient Prescriptions on File Prior to Visit  Medication Sig Dispense Refill  . albuterol (PROVENTIL HFA;VENTOLIN HFA) 108 (90 BASE) MCG/ACT inhaler Inhale 2 puffs into the lungs every 6 (six) hours as needed for wheezing.  1 Inhaler  0  . amLODipine (NORVASC) 5 MG tablet Take 1 tablet (5 mg total) by mouth daily.  90 tablet  1  . azithromycin (ZITHROMAX Z-PAK) 250 MG tablet Take 2 tablets on day one, then 1 tablet daily on days 2 through 5  1 each  0  . azithromycin (ZITHROMAX) 250 MG tablet Take as directed  6 tablet  0  . escitalopram (LEXAPRO) 10 MG tablet Take 1 tablet (10 mg total) by mouth daily.  90 tablet  2  . fexofenadine (ALLEGRA) 60 MG tablet Take 60 mg by mouth daily.        . fluticasone (FLONASE) 50 MCG/ACT nasal spray ONE SPRAY EACH NOSTRIL DAILY  16 g  0  . Fluticasone-Salmeterol (ADVAIR  DISKUS) 100-50 MCG/DOSE AEPB Inhale 1 puff into lungs bid  60 each  1  . levothyroxine (SYNTHROID, LEVOTHROID) 100 MCG tablet Take 1 tablet (100 mcg total) by mouth daily.  30 tablet  1  . medroxyPROGESTERone (PROVERA) 10 MG tablet Take 10 mg by mouth daily. 10 days per month       . Multiple Vitamin (MULTIVITAMIN) tablet Take 1 tablet by mouth daily.        . OMEGA 3 1000 MG CAPS Take 1 capsule by mouth 2 (two) times daily.        Marland Kitchen PREMARIN 1.25 MG tablet TAKE 1 TABLET BY MOUTH DAILY  30 tablet  0   No current facility-administered medications on file prior to visit.      HM:   Review of Systems     Objective:   Physical Exam  Physical Exam  Vital signs and nursing note reviewed  Constitutional: She is oriented to person, place, and time. She appears well-developed and well-nourished. She is cooperative.  HENT:  Head: Normocephalic and atraumatic.  Right Ear: Tympanic membrane normal.  Left Ear: Tympanic membrane normal.  Nose: Nose normal.  Mouth/Throat:  Oropharynx is clear and moist and mucous membranes are normal. No oropharyngeal exudate or posterior oropharyngeal erythema.  Eyes: Conjunctivae and EOM are normal. Pupils are equal, round, and reactive to light.  Neck: Neck supple. No JVD present. Carotid bruit is not present. No mass and no thyromegaly present.  Cardiovascular: Regular rhythm, normal heart sounds, intact distal pulses and normal pulses.  Exam reveals no gallop and no friction rub.   No murmur heard. Pulses:      Dorsalis pedis pulses are 2+ on the right side, and 2+ on the left side.  Pulmonary/Chest: Breath sounds normal. She has no wheezes. She has no rhonchi. She has no rales. Right breast exhibits no mass, no nipple discharge and no skin change. Left breast exhibits no mass, no nipple discharge and no skin change.  Abdominal: Soft. Bowel sounds are normal. She exhibits no distension and no mass. There is no hepatosplenomegaly. There is no tenderness. There is no CVA tenderness.  Genitourinary: Rectum normal, vagina normal and uterus normal. Rectal exam shows no mass. Guaiac negative stool. No labial fusion. There is no lesion on the right labia. There is no lesion on the left labia. Cervix exhibits no motion tenderness. Right adnexum displays no mass, no tenderness and no fullness. Left adnexum displays no mass, no tenderness and no fullness. No erythema around the vagina.  Musculoskeletal:       No active synovitis to any joint.    Lymphadenopathy:       Right cervical: No superficial cervical adenopathy present.      Left cervical: No superficial cervical adenopathy present.       Right axillary: No pectoral and no lateral adenopathy present.       Left axillary: No pectoral and no lateral adenopathy present.      Right: No inguinal adenopathy present.       Left: No inguinal adenopathy present.  Neurological: She is alert and oriented to person, place, and time. She has normal strength and normal reflexes. No cranial  nerve deficit or sensory deficit. She displays a negative Romberg sign. Coordination and gait normal.  Skin: Skin is warm and dry. No abrasion, no bruising, no ecchymosis and no rash noted. No cyanosis. Nails show no clubbing.  Psychiatric: She has a normal mood and affect. Her speech is normal  and behavior is normal.          Assessment & Plan:         Assessment & Plan:

## 2014-03-11 ENCOUNTER — Other Ambulatory Visit: Payer: Self-pay | Admitting: Internal Medicine

## 2014-03-13 NOTE — Telephone Encounter (Signed)
Refill request

## 2014-04-09 ENCOUNTER — Emergency Department (INDEPENDENT_AMBULATORY_CARE_PROVIDER_SITE_OTHER)
Admission: EM | Admit: 2014-04-09 | Discharge: 2014-04-09 | Disposition: A | Discharge status: Home / Self Care | Payer: BC Managed Care – HMO | Source: Home / Self Care | Attending: Family Medicine | Admitting: Family Medicine

## 2014-04-09 DIAGNOSIS — B9789 Other viral agents as the cause of diseases classified elsewhere: Principal | ICD-10-CM

## 2014-04-09 DIAGNOSIS — J9801 Acute bronchospasm: Secondary | ICD-10-CM

## 2014-04-09 DIAGNOSIS — J069 Acute upper respiratory infection, unspecified: Secondary | ICD-10-CM

## 2014-04-09 MED ORDER — FLUTICASONE PROPIONATE 50 MCG/ACT NA SUSP: NASAL | Status: DC

## 2014-04-09 MED ORDER — BENZONATATE 200 MG PO CAPS: 200.0000 mg | ORAL_CAPSULE | Freq: Every day | ORAL | Status: DC

## 2014-04-09 MED ORDER — AMOXICILLIN 875 MG PO TABS: 875.0000 mg | ORAL_TABLET | Freq: Two times a day (BID) | ORAL | Status: DC

## 2014-04-09 MED ORDER — TRIAMCINOLONE ACETONIDE 40 MG/ML IJ SUSP
40.0000 mg | Freq: Once | INTRAMUSCULAR | Status: AC
  Administered 2014-04-09: 40 mg via INTRAMUSCULAR

## 2014-04-09 NOTE — Discharge Instructions (Signed)
Take plain Mucinex (1200 mg guaifenesin) twice daily for cough and congestion.  Increase fluid intake, rest. May use Afrin nasal spray (or generic oxymetazoline) twice daily for about 5 days.  Also recommend using saline nasal spray several times daily and saline nasal irrigation (AYR is a common brand).  Use Flonase spray after Afrin spray and saline irrigation. Try warm salt water gargles for sore throat. Continue Advair and albuterol inhalers.  Stop all antihistamines for now, and other non-prescription cough/cold preparations. May take Ibuprofen 200mg , 4 tabs every 8 hours with food for chest/sternum discomfort. Begin Amoxicillin if not improving about one week or if persistent fever develops   Follow-up with family doctor if not improving about10 days.

## 2014-04-09 NOTE — ED Provider Notes (Signed)
CSN: [id number]     Arrival date & time 04/09/14  1225 History   First MD Initiated Contact with Patient 04/09/14 1337     Chief Complaint  Patient presents with  . Facial Pain      HPI Comments: Six days ago patient developed sinus congestion, fatigue, headache, earache, sinus pressure, and night sweats.  Four days ago she developed a cough, and complains of tightness in her anterior chest.  She has a history of asthma and has needed her albuterol inhaler more frequently.  The history is provided by the patient.    Past Medical History  Diagnosis Date  . Turner syndrome   . Vitamin D deficiency   . NASH (nonalcoholic steatohepatitis)   . Hip dysplasia   . Hematuria   . Scoliosis     minimal  . Allergy   . Anxiety   . Hyperlipidemia   . Hypertension   . Osteoporosis   . Thyroid disease    Past Surgical History  Procedure Laterality Date  . Tympanostomy tube placement    . Hernia repair      x2  . Bilateral oophorectomy    . Hip surgery    . Tonsillectomy     Family History  Problem Relation Age of Onset  . Hypertension Mother   . Arthritis Mother   . Fibromyalgia Mother   . Diabetes Father   . Diabetes Paternal Grandmother   . Stroke Paternal Grandmother   . Kidney disease Paternal Grandmother   . Diabetes Paternal Grandfather    History  Substance Use Topics  . Smoking status: Never Smoker   . Smokeless tobacco: Not on file  . Alcohol Use: Yes     Comment: socially   OB History    No data available     Review of Systems + sore throat + cough No pleuritic pain, but has tightness in anterior chest No wheezing + nasal congestion + post-nasal drainage + sinus pain/pressure No itchy/red eyes ? earache No hemoptysis + SOB No fever, + chills/night sweats No nausea No vomiting No abdominal pain No diarrhea No urinary symptoms No skin rash + fatigue No myalgias + headache Used OTC meds without relief  Allergies  Prednisone  Home  Medications   Prior to Admission medications   Medication Sig Start Date End Date Taking? Authorizing Provider  albuterol (PROVENTIL HFA;VENTOLIN HFA) 108 (90 BASE) MCG/ACT inhaler Inhale 2 puffs into the lungs every 6 (six) hours as needed for wheezing. 08/15/13  Yes Lanice Shirts, MD  amLODipine (NORVASC) 5 MG tablet Take 1 tablet (5 mg total) by mouth daily.   Yes Lanice Shirts, MD  escitalopram (LEXAPRO) 10 MG tablet Take 1 tablet (10 mg total) by mouth daily. 09/28/13  Yes Lanice Shirts, MD  fexofenadine (ALLEGRA) 60 MG tablet Take 60 mg by mouth daily.     Yes Historical Provider, MD  Fluticasone-Salmeterol (ADVAIR DISKUS) 100-50 MCG/DOSE AEPB Inhale 1 puff into lungs bid 08/15/13  Yes Lanice Shirts, MD  levothyroxine (SYNTHROID, LEVOTHROID) 100 MCG tablet TAKE 1 TABLET (100 MCG TOTAL) BY MOUTH DAILY. 03/13/14  Yes Lanice Shirts, MD  medroxyPROGESTERone (PROVERA) 10 MG tablet Take 10 mg by mouth daily. 10 days per month    Yes Historical Provider, MD  Multiple Vitamin (MULTIVITAMIN) tablet Take 1 tablet by mouth daily.     Yes Historical Provider, MD  OMEGA 3 1000 MG CAPS Take 1 capsule by mouth 2 (two) times daily.  Yes Historical Provider, MD  PREMARIN 1.25 MG tablet TAKE 1 TABLET BY MOUTH DAILY   Yes Lanice Shirts, MD  amoxicillin (AMOXIL) 875 MG tablet Take 1 tablet (875 mg total) by mouth 2 (two) times daily. (Rx void after 04/17/14) 04/09/14   Kandra Nicolas, MD  benzonatate (TESSALON) 200 MG capsule Take 1 capsule (200 mg total) by mouth at bedtime. Take as needed for cough 04/09/14   Kandra Nicolas, MD  fluticasone [facility]) 50 MCG/ACT nasal spray Place two sprays in each nostril once daily 04/09/14   Kandra Nicolas, MD   BP 133/88 mmHg  Pulse 82  Temp(Src) 98.2 F (36.8 C) (Oral)  Wt 176 lb (79.833 kg)  SpO2 99% Physical Exam Nursing notes and Vital Signs reviewed. Appearance:  Patient appears stated age, and in no acute  distress Eyes:  Pupils are equal, round, and reactive to light and accomodation.  Extraocular movement is intact.  Conjunctivae are not inflamed  Ears:  Canals normal.  Tympanic membranes normal.  Nose:  Mildly congested turbinates.  No sinus tenderness.  Pharynx:  Normal Neck:  Supple.  Tender enlarged posterior nodes are palpated bilaterally  Lungs:  Clear to auscultation.  Breath sounds are equal. Chest:  Distinct tenderness to palpation over the mid-sternum.   Heart:  Regular rate and rhythm without murmurs, rubs, or gallops.  Abdomen:  Nontender without masses or hepatosplenomegaly.  Bowel sounds are present.  No CVA or flank tenderness.  Extremities:  No edema.  No calf tenderness Skin:  No rash present.   ED Course  Procedures  None   Labs Reviewed -   Tympanogram normal both ears       MDM   1. Viral URI with cough   2. Bronchospasm    Kenalog 67m IM.  There is no evidence of bacterial infection today.   Treat symptomatically for now  Prescription written for Benzonatate (Tessalon) to take at bedtime for night-time cough.  Refill Flonase. Take plain Mucinex (1200 mg guaifenesin) twice daily for cough and congestion.  Increase fluid intake, rest. May use Afrin nasal spray (or generic oxymetazoline) twice daily for about 5 days.  Also recommend using saline nasal spray several times daily and saline nasal irrigation (AYR is a common brand).  Use Flonase spray after Afrin spray and saline irrigation. Try warm salt water gargles for sore throat. Continue Advair and albuterol inhalers.  Stop all antihistamines for now, and other non-prescription cough/cold preparations. May take Ibuprofen 2074m 4 tabs every 8 hours with food for chest/sternum discomfort. Begin Amoxicillin if not improving about one week or if persistent fever develops (Given a prescription to hold, with an expiration date)  Follow-up with family doctor if not improving about10 days.     StKandra Nicolas MD 04/16/14 1005

## 2014-04-09 NOTE — ED Notes (Signed)
Patient c/o sinus pain and pressure, ear and jaw pain x 1 week

## 2014-04-11 ENCOUNTER — Other Ambulatory Visit: Payer: Self-pay | Admitting: Internal Medicine

## 2014-04-11 NOTE — Telephone Encounter (Signed)
Refill request

## 2014-05-08 ENCOUNTER — Telehealth: Payer: Self-pay | Admitting: *Deleted

## 2014-05-08 NOTE — Telephone Encounter (Signed)
I have attempted to contact Cristina Zimmerman several times in regards to her thyroid medications, however her phone will not allow me to leave a voice message.

## 2014-07-15 ENCOUNTER — Other Ambulatory Visit: Payer: Self-pay | Admitting: Internal Medicine

## 2014-07-17 NOTE — Telephone Encounter (Signed)
Refill request

## 2015-01-19 ENCOUNTER — Encounter: Payer: Self-pay | Admitting: Emergency Medicine

## 2015-01-19 ENCOUNTER — Emergency Department (INDEPENDENT_AMBULATORY_CARE_PROVIDER_SITE_OTHER)
Admission: EM | Admit: 2015-01-19 | Discharge: 2015-01-19 | Disposition: A | Discharge status: Home / Self Care | Payer: 59 | Source: Home / Self Care | Attending: Family Medicine | Admitting: Family Medicine

## 2015-01-19 DIAGNOSIS — J40 Bronchitis, not specified as acute or chronic: Secondary | ICD-10-CM | POA: Diagnosis not present

## 2015-01-19 MED ORDER — TRIAMCINOLONE ACETONIDE 40 MG/ML IJ SUSP
40.0000 mg | Freq: Once | INTRAMUSCULAR | Status: AC
  Administered 2015-01-19: 40 mg via INTRAMUSCULAR

## 2015-01-19 MED ORDER — BENZONATATE 100 MG PO CAPS: 100.0000 mg | ORAL_CAPSULE | Freq: Three times a day (TID) | ORAL | Status: DC

## 2015-01-19 MED ORDER — HYDROCODONE-HOMATROPINE 5-1.5 MG/5ML PO SYRP: 5.0000 mL | ORAL_SOLUTION | Freq: Four times a day (QID) | ORAL | Status: DC | PRN

## 2015-01-19 MED ORDER — AZITHROMYCIN 250 MG PO TABS: 250.0000 mg | ORAL_TABLET | Freq: Every day | ORAL | Status: DC

## 2015-01-19 NOTE — Discharge Instructions (Signed)
Please take antibiotics as prescribed and be sure to complete entire course even if you start to feel better to ensure infection does not come back.  Hydcodan is a narcotic pain medication. While taking, do not drink alcohol, drive, or perform any other activities that requires focus while taking these medications.   You may take 400-600mg  Ibuprofen (Motrin) every 6-8 hours for fever and pain  Alternate with Tylenol  You may take  Tylenol every 4-6 hours as needed for fever and pain  Follow-up with your primary care provider next week for recheck of symptoms if not improving.  Be sure to drink plenty of fluids and rest, at least 8hrs of sleep a night, preferably more while you are sick. Return urgent care or go to closest ER if you cannot keep down fluids/signs of dehydration, fever not reducing with Tylenol, difficulty breathing/wheezing, stiff neck, worsening condition, or other concerns (see below)

## 2015-01-19 NOTE — ED Notes (Signed)
Dry cough, congestion, chest tightness, was treated 3 weeks ago for sinusitis didn't really get completely well, started getting worse 6 days ago

## 2015-01-19 NOTE — ED Provider Notes (Signed)
CSN: [id number]     Arrival date & time 01/19/15  0940 History   First MD Initiated Contact with Patient 01/19/15 [phone]     Chief Complaint  Patient presents with  . Cough   (Consider location/radiation/quality/duration/timing/severity/associated sxs/prior Treatment) HPI Pt is a 29yo female with hx of asthma, presenting to [facility] with c/o gradually worsening cough and chest tightness that started 6 days ago. Pt reports being treated for sinusitis with Augmentin but she never completely got better. Pt states she still had some congestion but head pressure did improve.  She has been using her albuterol rescue inhaler 1-2 times a day for last 1 week. Cough is moderate, intermittent, usually dry cough.  Keeps her up at night. Denies sick contacts or recent travel.  Past Medical History  Diagnosis Date  . Turner syndrome   . Vitamin D deficiency   . NASH (nonalcoholic steatohepatitis)   . Hip dysplasia   . Hematuria   . Scoliosis     minimal  . Allergy   . Anxiety   . Hyperlipidemia   . Hypertension   . Osteoporosis   . Thyroid disease    Past Surgical History  Procedure Laterality Date  . Tympanostomy tube placement    . Hernia repair      x2  . Bilateral oophorectomy    . Hip surgery    . Tonsillectomy     Family History  Problem Relation Age of Onset  . Hypertension Mother   . Arthritis Mother   . Fibromyalgia Mother   . Diabetes Father   . Diabetes Paternal Grandmother   . Stroke Paternal Grandmother   . Kidney disease Paternal Grandmother   . Diabetes Paternal Grandfather    Social History  Substance Use Topics  . Smoking status: Never Smoker   . Smokeless tobacco: None  . Alcohol Use: Yes     Comment: socially   OB History    No data available     Review of Systems  Constitutional: Negative for fever and chills.  HENT: Positive for congestion and voice change. Negative for ear pain, sore throat and trouble swallowing.   Respiratory: Positive for cough and  chest tightness. Negative for shortness of breath.   Cardiovascular: Negative for chest pain and palpitations.  Gastrointestinal: Negative for nausea, vomiting, abdominal pain and diarrhea.  Musculoskeletal: Negative for myalgias, back pain and arthralgias.  Skin: Negative for rash.  All other systems reviewed and are negative.   Allergies  Prednisone  Home Medications   Prior to Admission medications   Medication Sig Start Date End Date Taking? Authorizing Provider  albuterol (PROVENTIL HFA;VENTOLIN HFA) 108 (90 BASE) MCG/ACT inhaler Inhale 2 puffs into the lungs every 6 (six) hours as needed for wheezing. 08/15/13   Kendrick Ranch, MD  amLODipine (NORVASC) 5 MG tablet TAKE 1 TABLET (5 MG TOTAL) BY MOUTH DAILY. 04/11/14   Kendrick Ranch, MD  amoxicillin (AMOXIL) 875 MG tablet Take 1 tablet (875 mg total) by mouth 2 (two) times daily. (Rx void after 04/17/14) 04/09/14   Lattie Haw, MD  azithromycin (ZITHROMAX) 250 MG tablet Take 1 tablet (250 mg total) by mouth daily. Take first 2 tablets together, then 1 every day until finished. 01/19/15   Junius Finner, PA-C  benzonatate (TESSALON) 100 MG capsule Take 1 capsule (100 mg total) by mouth every 8 (eight) hours. 01/19/15   Junius Finner, PA-C  benzonatate (TESSALON) 200 MG capsule Take 1 capsule (200 mg total) by mouth  at bedtime. Take as needed for cough 04/09/14   Lattie Haw, MD  escitalopram (LEXAPRO) 10 MG tablet TAKE 1 TABLET (10 MG TOTAL) BY MOUTH DAILY. 07/18/14   Kendrick Ranch, MD  fexofenadine (ALLEGRA) 60 MG tablet Take 60 mg by mouth daily.      Historical Provider, MD  fluticasone Aleda Grana) 50 MCG/ACT nasal spray Place two sprays in each nostril once daily 04/09/14   Lattie Haw, MD  Fluticasone-Salmeterol (ADVAIR DISKUS) 100-50 MCG/DOSE AEPB Inhale 1 puff into lungs bid 08/15/13   Kendrick Ranch, MD  HYDROcodone-homatropine [facility]) 5-1.5 MG/5ML syrup Take 5 mLs by mouth every 6 (six) hours as  needed for cough. 01/19/15   Junius Finner, PA-C  levothyroxine (SYNTHROID, LEVOTHROID) 100 MCG tablet TAKE 1 TABLET (100 MCG TOTAL) BY MOUTH DAILY. 03/13/14   Kendrick Ranch, MD  medroxyPROGESTERone (PROVERA) 10 MG tablet Take 10 mg by mouth daily. 10 days per month     Historical Provider, MD  Multiple Vitamin (MULTIVITAMIN) tablet Take 1 tablet by mouth daily.      Historical Provider, MD  OMEGA 3 1000 MG CAPS Take 1 capsule by mouth 2 (two) times daily.      Historical Provider, MD  PREMARIN 1.25 MG tablet TAKE 1 TABLET BY MOUTH DAILY    Kendrick Ranch, MD   Meds Ordered and Administered this Visit   Medications  triamcinolone acetonide (KENALOG-40) injection 40 mg (not administered)    BP 134/88 mmHg  Pulse 99  Temp(Src) 98.1 F (36.7 C) (Oral)  Ht  (1.575 m)  Wt 170 lb (77.111 kg)  BMI 31.09 kg/m2  SpO2 96% No data found.   Physical Exam  Constitutional: She appears well-developed and well-nourished. No distress.  HENT:  Head: Normocephalic and atraumatic.  Right Ear: Hearing, tympanic membrane, external ear and ear canal normal.  Left Ear: Hearing, tympanic membrane, external ear and ear canal normal.  Nose: Nose normal.  Mouth/Throat: Uvula is midline, oropharynx is clear and moist and mucous membranes are normal.  Eyes: Conjunctivae are normal. No scleral icterus.  Neck: Normal range of motion. Neck supple.  Cardiovascular: Normal rate, regular rhythm and normal heart sounds.   Pulmonary/Chest: Effort normal. No respiratory distress. She has wheezes. She has no rales. She exhibits no tenderness.  No respiratory distress. Faint expiratory wheeze in lower lung fields. Intermittent dry to productive cough.  Abdominal: Soft. She exhibits no distension. There is no tenderness.  Musculoskeletal: Normal range of motion.  Neurological: She is alert.  Skin: Skin is warm and dry. She is not diaphoretic.  Nursing note and vitals reviewed.   ED Course   Procedures (including critical care time)  Labs Review Labs Reviewed - No data to display  Imaging Review No results found.    MDM   1. Bronchitis     Hx of asthma, recent sinusitis. Cough worsened after Augmentin. Pt states she cannot tolerate PO prednisone but steroids injections are "okay"  Will tx with azithromycin to cover for atypical bacteria and cover pneumonia.   Kenalog  IM given in UC Rx: azithromycin, tessalon, and hycodan. Patient counseled on use of narcotic pain medications. Urged not to drink alcohol, drive, or perform any other activities that requires focus while taking these medications.  F/u with PCP in 1 week if not improving, sooner if worsening.    Junius Finner, PA-C 01/19/15 1018

## 2015-05-24 ENCOUNTER — Encounter: Payer: Self-pay | Admitting: *Deleted

## 2015-05-24 ENCOUNTER — Emergency Department (INDEPENDENT_AMBULATORY_CARE_PROVIDER_SITE_OTHER)
Admission: EM | Admit: 2015-05-24 | Discharge: 2015-05-24 | Disposition: A | Discharge status: Home / Self Care | Payer: 59 | Source: Home / Self Care | Attending: Family Medicine | Admitting: Family Medicine

## 2015-05-24 DIAGNOSIS — Z8709 Personal history of other diseases of the respiratory system: Secondary | ICD-10-CM | POA: Diagnosis not present

## 2015-05-24 DIAGNOSIS — Z8679 Personal history of other diseases of the circulatory system: Secondary | ICD-10-CM | POA: Diagnosis not present

## 2015-05-24 DIAGNOSIS — H65191 Other acute nonsuppurative otitis media, right ear: Secondary | ICD-10-CM | POA: Diagnosis not present

## 2015-05-24 DIAGNOSIS — Z760 Encounter for issue of repeat prescription: Secondary | ICD-10-CM

## 2015-05-24 DIAGNOSIS — J069 Acute upper respiratory infection, unspecified: Secondary | ICD-10-CM | POA: Diagnosis not present

## 2015-05-24 MED ORDER — AZITHROMYCIN 250 MG PO TABS: 250.0000 mg | ORAL_TABLET | Freq: Every day | ORAL | Status: DC

## 2015-05-24 MED ORDER — ESTROGENS CONJUGATED 1.25 MG PO TABS: 1.2500 mg | ORAL_TABLET | Freq: Every day | ORAL | Status: DC

## 2015-05-24 MED ORDER — METHYLPREDNISOLONE SODIUM SUCC 40 MG IJ SOLR
80.0000 mg | Freq: Once | INTRAMUSCULAR | Status: AC
  Administered 2015-05-24: 80 mg via INTRAMUSCULAR

## 2015-05-24 MED ORDER — ESCITALOPRAM OXALATE 10 MG PO TABS: 10.0000 mg | ORAL_TABLET | Freq: Every day | ORAL | Status: DC

## 2015-05-24 MED ORDER — LEVOTHYROXINE SODIUM 100 MCG PO TABS: 100.0000 ug | ORAL_TABLET | Freq: Every day | ORAL | Status: DC

## 2015-05-24 MED ORDER — BENZONATATE 100 MG PO CAPS: 100.0000 mg | ORAL_CAPSULE | Freq: Three times a day (TID) | ORAL | Status: DC

## 2015-05-24 MED ORDER — FLUTICASONE PROPIONATE 50 MCG/ACT NA SUSP: NASAL | Status: DC

## 2015-05-24 MED ORDER — FLUTICASONE-SALMETEROL 100-50 MCG/DOSE IN AEPB: INHALATION_SPRAY | RESPIRATORY_TRACT | Status: DC

## 2015-05-24 MED ORDER — ALBUTEROL SULFATE HFA 108 (90 BASE) MCG/ACT IN AERS: 2.0000 | INHALATION_SPRAY | Freq: Four times a day (QID) | RESPIRATORY_TRACT | Status: DC | PRN

## 2015-05-24 MED ORDER — AMLODIPINE BESYLATE 5 MG PO TABS: 5.0000 mg | ORAL_TABLET | Freq: Every day | ORAL | Status: DC

## 2015-05-24 NOTE — ED Provider Notes (Signed)
CSN: [id number]     Arrival date & time 05/24/15  1017 History   First MD Initiated Contact with Patient 05/24/15 1022     Chief Complaint  Patient presents with  . Nasal Congestion  . Cough   (Consider location/radiation/quality/duration/timing/severity/associated sxs/prior Treatment) HPI  Pt is a 30yo female presenting to [facility] with c/o gradually worsening cough with congestion, bilateral ear pain and burning eyes for 1 week.  Pt notes cough got significantly worse last night, requiring her to use her albuterol rescue inhaler, which did help open her up, but pt states cough and congestion are still present.  She did tried OTC cough medication yesterday with minimal relief.  Pt notes the rest of her family is also sick but they mainly have sore throats.  Pt only has mild scratchy throat. Her cough and ear pain is most bothersome for her. Denies fever, chills, n/v/d.  She has had prednisone in the past for her asthma but states she cannot tolerate PO prednisone as it causes mood swings and jitteriness.  She has had steroid shots before without trouble.  Pt also requesting medication refills as she has hx of HTN, Turner Syndrome, and NASH.  Her PCP recently retired or left the office so she is looking for a new PCP. She planned on scheduling an appointment this week but then got sick.  Past Medical History  Diagnosis Date  . Turner syndrome   . Vitamin D deficiency   . NASH (nonalcoholic steatohepatitis)   . Hip dysplasia   . Hematuria   . Scoliosis     minimal  . Allergy   . Anxiety   . Hyperlipidemia   . Hypertension   . Osteoporosis   . Thyroid disease    Past Surgical History  Procedure Laterality Date  . Tympanostomy tube placement    . Hernia repair      x2  . Bilateral oophorectomy    . Hip surgery    . Tonsillectomy     Family History  Problem Relation Age of Onset  . Hypertension Mother   . Arthritis Mother   . Fibromyalgia Mother   . Diabetes Father   . Diabetes  Paternal Grandmother   . Stroke Paternal Grandmother   . Kidney disease Paternal Grandmother   . Diabetes Paternal Grandfather    Social History  Substance Use Topics  . Smoking status: Never Smoker   . Smokeless tobacco: None  . Alcohol Use: Yes     Comment: socially   OB History    No data available     Review of Systems  Constitutional: Negative for fever and chills.  HENT: Positive for congestion, ear pain ( bilateral), postnasal drip, rhinorrhea, sinus pressure, sore throat ( scratchy) and voice change. Negative for sneezing and trouble swallowing.   Respiratory: Positive for cough. Negative for shortness of breath.   Cardiovascular: Negative for chest pain and palpitations.  Gastrointestinal: Negative for nausea, vomiting, abdominal pain and diarrhea.  Musculoskeletal: Negative for myalgias, back pain and arthralgias.  Skin: Negative for rash.  Neurological: Positive for headaches. Negative for dizziness and light-headedness.    Allergies  Prednisone  Home Medications   Prior to Admission medications   Medication Sig Start Date End Date Taking? Authorizing Provider  albuterol (PROVENTIL HFA;VENTOLIN HFA) 108 (90 Base) MCG/ACT inhaler Inhale 2 puffs into the lungs every 6 (six) hours as needed for wheezing. 05/24/15   Junius Finner, PA-C  amLODipine (NORVASC) 5 MG tablet Take 1 tablet (5  mg total) by mouth daily. 05/24/15   Junius Finner, PA-C  azithromycin (ZITHROMAX) 250 MG tablet Take 1 tablet (250 mg total) by mouth daily. Take first 2 tablets together, then 1 every day until finished. 05/24/15   Junius Finner, PA-C  benzonatate (TESSALON) 100 MG capsule Take 1 capsule (100 mg total) by mouth every 8 (eight) hours. 05/24/15   Junius Finner, PA-C  escitalopram (LEXAPRO) 10 MG tablet Take 1 tablet (10 mg total) by mouth daily. 05/24/15   Junius Finner, PA-C  estrogens, conjugated, (PREMARIN) 1.25 MG tablet Take 1 tablet (1.25 mg total) by mouth daily. 05/24/15   Junius Finner, PA-C   fexofenadine (ALLEGRA) 60 MG tablet Take 60 mg by mouth daily.      Historical Provider, MD  fluticasone Aleda Grana) 50 MCG/ACT nasal spray Place two sprays in each nostril once daily 05/24/15   Junius Finner, PA-C  Fluticasone-Salmeterol (ADVAIR DISKUS) 100-50 MCG/DOSE AEPB Inhale 1 puff into lungs bid 05/24/15   Junius Finner, PA-C  levothyroxine (SYNTHROID, LEVOTHROID) 100 MCG tablet Take 1 tablet (100 mcg total) by mouth daily before breakfast. 05/24/15   Junius Finner, PA-C  medroxyPROGESTERone (PROVERA) 10 MG tablet Take 10 mg by mouth daily. 10 days per month     Historical Provider, MD  Multiple Vitamin (MULTIVITAMIN) tablet Take 1 tablet by mouth daily.      Historical Provider, MD  OMEGA 3 1000 MG CAPS Take 1 capsule by mouth 2 (two) times daily.      Historical Provider, MD   Meds Ordered and Administered this Visit   Medications  methylPREDNISolone sodium succinate (SOLU-MEDROL) 40 mg/mL injection 80 mg (80 mg Intramuscular Given 05/24/15 1123)    BP 159/97 mmHg  Pulse 106  Temp(Src) 98.9 F (37.2 C) (Oral)  Resp 16  Wt 171 lb (77.565 kg)  SpO2 98% No data found.   Physical Exam  Constitutional: She appears well-developed and well-nourished. No distress.  HENT:  Head: Normocephalic and atraumatic.  Right Ear: Hearing, external ear and ear canal normal. A middle ear effusion is present.  Left Ear: Hearing, tympanic membrane, external ear and ear canal normal.  Nose: Mucosal edema present. Right sinus exhibits no maxillary sinus tenderness and no frontal sinus tenderness. Left sinus exhibits no maxillary sinus tenderness and no frontal sinus tenderness.  Mouth/Throat: Uvula is midline, oropharynx is clear and moist and mucous membranes are normal.  Eyes: Conjunctivae are normal. No scleral icterus.  Neck: Normal range of motion. Neck supple.  Cardiovascular: Regular rhythm and normal heart sounds.  Tachycardia present.   Mild tachycardia, regular rhythm  Pulmonary/Chest: Effort  normal. No stridor. No respiratory distress. She has no wheezes. She has rhonchi in the right upper field and the left upper field. She has no rales. She exhibits no tenderness.  Rhonchi in upper lung fields, clears with cough. No respiratory distress.  Abdominal: Soft. She exhibits no distension. There is no tenderness.  Musculoskeletal: Normal range of motion.  Lymphadenopathy:    She has no cervical adenopathy.  Neurological: She is alert.  Skin: Skin is warm and dry. She is not diaphoretic.  Nursing note and vitals reviewed.   ED Course  Procedures (including critical care time)  Labs Review Labs Reviewed - No data to display  Imaging Review No results found.  Tympanogram: Right ear- High Peak Height.  Left ear- normal    MDM   1. Acute upper respiratory infection   2. Acute nonsuppurative otitis media of right ear   3.  History of hypertension   4. History of asthma   5. Medication refill    Pt c/o worsening URI symptoms for 1 week. Hx of asthma.  Rhonchi on exam with productive cough.  Pt c/o bilateral ear pain.  Tympanometry of Right ear indicates early onset of Right AOM.  Discussed treatment with Augmentin.  Pt states she had it in the past but wound up returning for additional care as it did not help her symptoms.  She has had Azithromycin in the past and has done well.  Tx in UC: solumedrol 80mg  IM  Rx: Azithromycin and Tessalon Medications refilled. Two week refills on synthroid, escitalopram, and premarin.  One month refill on asthma and BP medication.  Strongly encouraged pt to f/u with PCP within 2 weeks for additional medication refills as labs and pt's prior medical records would likely be reviewed prior to long term medication refills.  Patient verbalized understanding and agreement with treatment plan. Work note provided for today and tomorrow.   Junius Finner, PA-C 05/24/15 1131

## 2015-05-24 NOTE — ED Notes (Signed)
Pt c/o about 1 week of cough and nsal congestion. Also c/o bilateral ear pain and her eyes burning.

## 2015-05-24 NOTE — Discharge Instructions (Signed)
You may take 400-600mg  Ibuprofen (Motrin) every 6-8 hours for fever and pain  Alternate with Tylenol  You may take  Tylenol every 4-6 hours as needed for fever and pain  Follow-up with your primary care provider next week for recheck of symptoms if not improving.  Be sure to drink plenty of fluids and rest, at least 8hrs of sleep a night, preferably more while you are sick. Return urgent care or go to closest ER if you cannot keep down fluids/signs of dehydration, fever not reducing with Tylenol, difficulty breathing/wheezing, stiff neck, worsening condition, or other concerns (see below)  Please take antibiotics as prescribed and be sure to complete entire course even if you start to feel better to ensure infection does not come back.   Your blood pressure and asthma medications have been refilled for 1 month, however, the medications that require closer follow up including escitalopram, premarin, and synthroid have been refilled for 2 weeks.  Please be sure to schedule a follow up appointment with a Primary Care Provider (PCP)/Family doctor, as soon as possible to help you get reestablished on your long term medications.

## 2015-05-28 ENCOUNTER — Telehealth: Payer: Self-pay | Admitting: Emergency Medicine

## 2015-05-28 NOTE — ED Notes (Signed)
Inquired about patient's status; encourage them to call with questions/concerns.  

## 2015-09-13 ENCOUNTER — Ambulatory Visit (INDEPENDENT_AMBULATORY_CARE_PROVIDER_SITE_OTHER): Payer: 59 | Admitting: Osteopathic Medicine

## 2015-09-13 ENCOUNTER — Encounter: Payer: Self-pay | Admitting: Osteopathic Medicine

## 2015-09-13 VITALS — BP 134/91 | HR 112 | Ht 62.0 in | Wt 172.0 lb

## 2015-09-13 DIAGNOSIS — F4323 Adjustment disorder with mixed anxiety and depressed mood: Secondary | ICD-10-CM

## 2015-09-13 DIAGNOSIS — E039 Hypothyroidism, unspecified: Secondary | ICD-10-CM | POA: Diagnosis not present

## 2015-09-13 DIAGNOSIS — E785 Hyperlipidemia, unspecified: Secondary | ICD-10-CM | POA: Diagnosis not present

## 2015-09-13 DIAGNOSIS — I10 Essential (primary) hypertension: Secondary | ICD-10-CM | POA: Diagnosis not present

## 2015-09-13 DIAGNOSIS — R7989 Other specified abnormal findings of blood chemistry: Secondary | ICD-10-CM

## 2015-09-13 DIAGNOSIS — J4530 Mild persistent asthma, uncomplicated: Secondary | ICD-10-CM

## 2015-09-13 DIAGNOSIS — Q969 Turner's syndrome, unspecified: Secondary | ICD-10-CM

## 2015-09-13 MED ORDER — ESCITALOPRAM OXALATE 10 MG PO TABS: 10.0000 mg | ORAL_TABLET | Freq: Every day | ORAL | Status: DC

## 2015-09-13 MED ORDER — FLUTICASONE-SALMETEROL 100-50 MCG/DOSE IN AEPB: 1.0000 | INHALATION_SPRAY | Freq: Two times a day (BID) | RESPIRATORY_TRACT | Status: DC

## 2015-09-13 MED ORDER — AMLODIPINE BESYLATE 5 MG PO TABS: 5.0000 mg | ORAL_TABLET | Freq: Every day | ORAL | Status: DC

## 2015-09-13 MED ORDER — ESTROGENS CONJUGATED 1.25 MG PO TABS: 1.2500 mg | ORAL_TABLET | Freq: Every day | ORAL | Status: DC

## 2015-09-13 MED ORDER — MEDROXYPROGESTERONE ACETATE 10 MG PO TABS: 10.0000 mg | ORAL_TABLET | Freq: Every day | ORAL | Status: DC

## 2015-09-13 MED ORDER — LEVOTHYROXINE SODIUM 100 MCG PO TABS: 100.0000 ug | ORAL_TABLET | Freq: Every day | ORAL | Status: DC

## 2015-09-13 NOTE — Progress Notes (Signed)
HPI: Cristina Zimmerman is a 30 y.o. female who presents to [name] today for chief complaint of:  Chief Complaint  Patient presents with  . Establish Care    MEDICATION REFILLS    Has been out of meds a few months, old PCP office closed.   REPRODUCTIVE ENDOCRINOLOGY - Hx Turner's, lack ovaries, on hormone replacement, records reviewed. Was going to [facility] in [place], Dr. April Manson, M.D. And endocrinologist Dr. Katrinka Blazing. - I don't see records for these doctors, not sure what if any lab monitoring was done for hormone replacement.   HYPOTHYROID - has been off med. Maybe feeling a bit more tired.   MOOD/ANXIETY - Escitalopram helps fine but has noticed difference in mood since off this Rx.   HTN - out of meds, no CP/SOB.   ASTHMA - mild, not been on Adviar daily but wanting to get back on this to exercise better.    Past medical, social and family history reviewed: Past Medical History  Diagnosis Date  . Turner syndrome   . Vitamin D deficiency   . NASH (nonalcoholic steatohepatitis)   . Hip dysplasia   . Hematuria   . Scoliosis     minimal  . Allergy   . Anxiety   . Hyperlipidemia   . Hypertension   . Osteoporosis   . Thyroid disease    Past Surgical History  Procedure Laterality Date  . Tympanostomy tube placement    . Hernia repair      x2  . Bilateral oophorectomy    . Hip surgery    . Tonsillectomy     Social History  Substance Use Topics  . Smoking status: Never Smoker   . Smokeless tobacco: Not on file  . Alcohol Use: Yes     Comment: socially   Family History  Problem Relation Age of Onset  . Hypertension Mother   . Arthritis Mother   . Fibromyalgia Mother   . Hyperlipidemia Mother   . Diabetes Father   . Diabetes Paternal Grandmother   . Stroke Paternal Grandmother   . Kidney disease Paternal Grandmother   . Heart attack Paternal Grandmother   . Hypertension Paternal Grandmother   . Hyperlipidemia Paternal  Grandmother   . Diabetes Paternal Grandfather   . Alcohol abuse Maternal Grandfather   . Cancer Maternal Grandfather     BRAIN    Current Outpatient Prescriptions  Medication Sig Dispense Refill  . albuterol (PROVENTIL HFA;VENTOLIN HFA) 108 (90 Base) MCG/ACT inhaler Inhale 2 puffs into the lungs every 6 (six) hours as needed for wheezing. 1 Inhaler 0  . amLODipine (NORVASC) 5 MG tablet Take 1 tablet (5 mg total) by mouth daily. 30 tablet 0  . escitalopram (LEXAPRO) 10 MG tablet Take 1 tablet (10 mg total) by mouth daily. 14 tablet 0  . estrogens, conjugated, (PREMARIN) 1.25 MG tablet Take 1 tablet (1.25 mg total) by mouth daily. 14 tablet 0  . fexofenadine (ALLEGRA) 60 MG tablet Take 60 mg by mouth daily.      . fluticasone (FLONASE) 50 MCG/ACT nasal spray Place two sprays in each nostril once daily 16 g 1  . Fluticasone-Salmeterol (ADVAIR DISKUS) 100-50 MCG/DOSE AEPB Inhale 1 puff into lungs bid 60 each 0  . levothyroxine (SYNTHROID, LEVOTHROID) 100 MCG tablet Take 1 tablet (100 mcg total) by mouth daily before breakfast. 14 tablet 0  . medroxyPROGESTERone (PROVERA) 10 MG tablet Take 10 mg by mouth daily. 10 days per month     .  Multiple Vitamin (MULTIVITAMIN) tablet Take 1 tablet by mouth daily.       No current facility-administered medications for this visit.   Allergies  Allergen Reactions  . Prednisone     PO prednisone only- Jittery, anxious, elevated blood pressure.   Pt does "okay" with steroid injections      Review of Systems: CONSTITUTIONAL:  No  fever, no chills, No  unintentional weight changes HEAD/EYES/EARS/NOSE/THROAT: (+) occasional headache, no vision change, no hearing change, No  sore throat, No  sinus pressure, (+) allergies CARDIAC: No  chest pain, No  pressure, No palpitations, No  orthopnea RESPIRATORY: No  cough, No  shortness of breath/wheeze GASTROINTESTINAL: No  nausea, No  vomiting, No  abdominal pain, No  blood in stool, No  diarrhea, No  constipation   MUSCULOSKELETAL: No  myalgia/arthralgia GENITOURINARY: No  incontinence, No  abnormal genital bleeding/discharge SKIN: No  rash/wounds/concerning lesions HEM/ONC: No  easy bruising/bleeding, No  abnormal lymph node ENDOCRINE: No polyuria/polydipsia/polyphagia, No  heat/cold intolerance  NEUROLOGIC: No  weakness, No  dizziness, No  slurred speech PSYCHIATRIC: (+) concerns with depression, (+) concerns with anxiety, (+) sleep problems  Exam:  BP 134/91 mmHg  Pulse 112  Ht 5\' 2"  (1.575 m)  Wt 172 lb (78.019 kg)  BMI 31.45 kg/m2 Constitutional: VS see above. General Appearance: alert, well-developed, well-nourished, NAD Eyes: Normal lids and conjunctive, non-icteric sclera Ears, Nose, Mouth, Throat: MMM, Normal external inspection ears/nares/mouth/lips/gums,  Neck: No masses, trachea midline. (+) mild thyroid enlargement/ No tenderness/mass appreciated. No lymphadenopathy Respiratory: Normal respiratory effort. no wheeze, no rhonchi, no rales Cardiovascular: S1/S2 normal, no murmur, no rub/gallop auscultated. RRR. No lower extremity edema. Musculoskeletal: Gait normal.  Psychiatric: Normal judgment/insight. Normal mood and affect. Oriented x3.    No results found for this or any previous visit (from the past 72 hour(s)).    ASSESSMENT/PLAN: Labs in 6 weeks once back on the meds. RTC sooner if any problems/concerns. Will RTC for AWV/pap, pt advised if we need to discuss lab abn at that visit may get billed for those problems addressed. Request records.   Turner syndrome - Plan: estrogens, conjugated, (PREMARIN) 1.25 MG tablet, medroxyPROGESTERone (PROVERA) 10 MG tablet  Essential hypertension - Plan: amLODipine (NORVASC) 5 MG tablet, CBC with Differential/Platelet, COMPLETE METABOLIC PANEL WITH GFR  Hypothyroidism, unspecified hypothyroidism type - Plan: levothyroxine (SYNTHROID, LEVOTHROID) 100 MCG tablet, TSH  Hyperlipidemia - Plan: Lipid panel  Asthma, mild persistent,  uncomplicated - Plan: Fluticasone-Salmeterol (ADVAIR DISKUS) 100-50 MCG/DOSE AEPB  Adjustment disorder with mixed anxiety and depressed mood - Plan: escitalopram (LEXAPRO) 10 MG tablet  Low serum vitamin D - Plan: VITAMIN D 25 Hydroxy (Vit-D Deficiency, Fractures)   All questions were answered. Visit summary with updated medication list and pertinent instructions was printed for patient. ER/RTC precautions were reviewed with the patient. Return in about 6 months (around 03/15/2016), or sooner if needed, for LABS FEW DAYS PRIOR TO VISIT WITH DR. A.

## 2015-10-18 ENCOUNTER — Ambulatory Visit: Payer: 59 | Admitting: Osteopathic Medicine

## 2015-11-01 ENCOUNTER — Ambulatory Visit: Payer: 59 | Admitting: Osteopathic Medicine

## 2015-11-16 ENCOUNTER — Emergency Department (HOSPITAL_BASED_OUTPATIENT_CLINIC_OR_DEPARTMENT_OTHER)
Admission: EM | Admit: 2015-11-16 | Discharge: 2015-11-16 | Disposition: A | Discharge status: Home / Self Care | Payer: 59 | Attending: Emergency Medicine | Admitting: Emergency Medicine

## 2015-11-16 ENCOUNTER — Encounter (HOSPITAL_BASED_OUTPATIENT_CLINIC_OR_DEPARTMENT_OTHER): Payer: Self-pay | Admitting: *Deleted

## 2015-11-16 ENCOUNTER — Emergency Department (HOSPITAL_BASED_OUTPATIENT_CLINIC_OR_DEPARTMENT_OTHER): Payer: 59

## 2015-11-16 DIAGNOSIS — I10 Essential (primary) hypertension: Secondary | ICD-10-CM | POA: Insufficient documentation

## 2015-11-16 DIAGNOSIS — E785 Hyperlipidemia, unspecified: Secondary | ICD-10-CM | POA: Insufficient documentation

## 2015-11-16 DIAGNOSIS — E039 Hypothyroidism, unspecified: Secondary | ICD-10-CM | POA: Diagnosis not present

## 2015-11-16 DIAGNOSIS — Z79899 Other long term (current) drug therapy: Secondary | ICD-10-CM | POA: Diagnosis not present

## 2015-11-16 DIAGNOSIS — G43009 Migraine without aura, not intractable, without status migrainosus: Secondary | ICD-10-CM

## 2015-11-16 DIAGNOSIS — G43909 Migraine, unspecified, not intractable, without status migrainosus: Secondary | ICD-10-CM | POA: Insufficient documentation

## 2015-11-16 DIAGNOSIS — R200 Anesthesia of skin: Secondary | ICD-10-CM | POA: Insufficient documentation

## 2015-11-16 LAB — PREGNANCY, URINE: PREG TEST UR: NEGATIVE

## 2015-11-16 MED ORDER — KETOROLAC TROMETHAMINE 30 MG/ML IJ SOLN
30.0000 mg | Freq: Once | INTRAMUSCULAR | Status: AC
  Administered 2015-11-16: 30 mg via INTRAVENOUS
  Filled 2015-11-16: qty 1

## 2015-11-16 MED ORDER — NARATRIPTAN HCL 2.5 MG PO TABS
2.5000 mg | ORAL_TABLET | ORAL | 0 refills | Status: DC | PRN
Start: 2015-11-16 — End: 2015-11-22

## 2015-11-16 MED ORDER — METOCLOPRAMIDE HCL 5 MG/ML IJ SOLN
10.0000 mg | Freq: Once | INTRAMUSCULAR | Status: AC
  Administered 2015-11-16: 10 mg via INTRAVENOUS
  Filled 2015-11-16: qty 2

## 2015-11-16 MED ORDER — DIPHENHYDRAMINE HCL 50 MG/ML IJ SOLN
25.0000 mg | Freq: Once | INTRAMUSCULAR | Status: AC
  Administered 2015-11-16: 25 mg via INTRAVENOUS
  Filled 2015-11-16: qty 1

## 2015-11-16 NOTE — ED Provider Notes (Signed)
[facility] Provider Note   CSN: [id number] Arrival date & time: 11/16/15  1450  First Provider Contact:  First MD Initiated Contact with Patient 11/16/15 1515        History   Chief Complaint Chief Complaint  Patient presents with  . Migraine    HPI Cristina Zimmerman is a 30 y.o. female.  Pt has had a headache for the past week.  She has a hx of migraines, but usually they are controlled with otc meds.  The pt has had nausea and dizziness for the past week as well.  Pt has never had a ct of her head for her headaches.    Migraine  Associated symptoms include headaches.    Past Medical History:  Diagnosis Date  . Allergy   . Anxiety   . Hematuria   . Hip dysplasia   . Hyperlipidemia   . Hypertension   . NASH (nonalcoholic steatohepatitis)   . Osteoporosis   . Scoliosis    minimal  . Thyroid disease   . Turner syndrome   . Vitamin D deficiency     Patient Active Problem List   Diagnosis Date Noted  . Hypothyroidism 07/22/2012  . Childhood asthma 07/20/2012  . Bronchospasm 07/20/2012  . Allergy   . Anxiety   . Turner syndrome   . Hip dysplasia   . Vitamin D deficiency   . Hematuria   . Hyperlipidemia   . Hypertension   . Osteoporosis   . NASH (nonalcoholic steatohepatitis)   . Scoliosis     Past Surgical History:  Procedure Laterality Date  . BILATERAL OOPHORECTOMY    . HERNIA REPAIR     x2  . HIP SURGERY    . TONSILLECTOMY    . TYMPANOSTOMY TUBE PLACEMENT      OB History    No data available       Home Medications    Prior to Admission medications   Medication Sig Start Date End Date Taking? Authorizing Provider  albuterol (PROVENTIL HFA;VENTOLIN HFA) 108 (90 Base) MCG/ACT inhaler Inhale 2 puffs into the lungs every 6 (six) hours as needed for wheezing. 05/24/15  Yes Noland Fordyce, PA-C  amLODipine (NORVASC) 5 MG tablet Take 1 tablet (5 mg total) by mouth daily. 09/13/15  Yes Emeterio Reeve, DO  escitalopram (LEXAPRO) 10 MG  tablet Take 1 tablet (10 mg total) by mouth daily. 09/13/15  Yes Emeterio Reeve, DO  estrogens, conjugated, (PREMARIN) 1.25 MG tablet Take 1 tablet (1.25 mg total) by mouth daily. 09/13/15  Yes Emeterio Reeve, DO  fexofenadine (ALLEGRA) 60 MG tablet Take 60 mg by mouth daily.     Yes Historical Provider, MD  fluticasone Asencion Islam) 50 MCG/ACT nasal spray Place two sprays in each nostril once daily 05/24/15  Yes Noland Fordyce, PA-C  Fluticasone-Salmeterol (ADVAIR DISKUS) 100-50 MCG/DOSE AEPB Inhale 1 puff into the lungs 2 (two) times daily. Inhale 1 puff into lungs bid 09/13/15  Yes Emeterio Reeve, DO  levothyroxine (SYNTHROID, LEVOTHROID) 100 MCG tablet Take 1 tablet (100 mcg total) by mouth daily before breakfast. 09/13/15  Yes Emeterio Reeve, DO  medroxyPROGESTERone (PROVERA) 10 MG tablet Take 1 tablet (10 mg total) by mouth daily. 10 days per month 09/13/15  Yes Emeterio Reeve, DO  Multiple Vitamin (MULTIVITAMIN) tablet Take 1 tablet by mouth daily.     Yes Historical Provider, MD  naratriptan (AMERGE) 2.5 MG tablet Take 1 tablet (2.5 mg total) by mouth as needed for migraine. Take one (1) tablet at  onset of headache; if returns or does not resolve, may repeat after 4 hours; do not exceed five (5) mg in 24 hours. 11/16/15   Isla Pence, MD    Family History Family History  Problem Relation Age of Onset  . Hypertension Mother   . Arthritis Mother   . Fibromyalgia Mother   . Hyperlipidemia Mother   . Diabetes Father   . Diabetes Paternal Grandmother   . Stroke Paternal Grandmother   . Kidney disease Paternal Grandmother   . Heart attack Paternal Grandmother   . Hypertension Paternal Grandmother   . Hyperlipidemia Paternal Grandmother   . Diabetes Paternal Grandfather   . Alcohol abuse Maternal Grandfather   . Cancer Maternal Grandfather     BRAIN    Social History Social History  Substance Use Topics  . Smoking status: Never Smoker  . Smokeless tobacco: Never Used  .  Alcohol use Yes     Comment: socially     Allergies   Prednisone   Review of Systems Review of Systems  Gastrointestinal: Positive for nausea.  Neurological: Positive for numbness and headaches.  All other systems reviewed and are negative.    Physical Exam Updated Vital Signs BP 129/86 (BP Location: Right Arm)   Pulse 120   Temp 98.7 F (37.1 C) (Oral)   Resp 18   Ht 5' 2"  (1.575 m)   Wt 170 lb (77.1 kg)   SpO2 100%   BMI 31.09 kg/m   Physical Exam  Constitutional: She is oriented to person, place, and time. She appears well-developed and well-nourished.  HENT:  Head: Normocephalic and atraumatic.  Right Ear: External ear normal.  Left Ear: External ear normal.  Nose: Nose normal.  Mouth/Throat: Oropharynx is clear and moist.  Eyes: Conjunctivae and EOM are normal. Pupils are equal, round, and reactive to light.  Neck: Normal range of motion. Neck supple.  Cardiovascular: Normal rate, regular rhythm, normal heart sounds and intact distal pulses.   Pulmonary/Chest: Effort normal and breath sounds normal.  Abdominal: Soft. Bowel sounds are normal.  Musculoskeletal: Normal range of motion.  Neurological: She is alert and oriented to person, place, and time.  Skin: Skin is warm and dry.  Psychiatric: She has a normal mood and affect. Her behavior is normal. Judgment and thought content normal.  Nursing note and vitals reviewed.    ED Treatments / Results  Labs (all labs ordered are listed, but only abnormal results are displayed) Labs Reviewed  PREGNANCY, URINE    EKG  EKG Interpretation None       Radiology Ct Head Wo Contrast  Result Date: 11/16/2015 CLINICAL DATA:  Headache for 1 week.  Nausea. EXAM: CT HEAD WITHOUT CONTRAST TECHNIQUE: Contiguous axial images were obtained from the base of the skull through the vertex without intravenous contrast. COMPARISON:  May 01, 2010 FINDINGS: Brain: The ventricles are normal in size and configuration.  There is no intracranial mass, hemorrhage, extra-axial fluid collection, or midline shift. Gray-white compartments appear normal. No acute infarct evident. Vascular: There is no appreciable hyperdense vessel. There is no evident vascular calcification. Skull: The bony calvarium appears intact. Sinuses/Orbits: Visualized paranasal sinuses are clear. No intraorbital lesions are evident. Other: Mastoid air cells are clear. IMPRESSION: Study within normal limits. Electronically Signed   By: Lowella Grip III M.D.   On: 11/16/2015 15:49   Procedures Procedures (including critical care time)  Medications Ordered in ED Medications  metoCLOPramide (REGLAN) injection 10 mg (10 mg Intravenous Given 11/16/15 1532)  diphenhydrAMINE (BENADRYL) injection 25 mg (25 mg Intravenous Given 11/16/15 1535)  ketorolac (TORADOL) 30 MG/ML injection 30 mg (30 mg Intravenous Given 11/16/15 1531)     Initial Impression / Assessment and Plan / ED Course  I have reviewed the triage vital signs and the nursing notes.  Pertinent labs & imaging results that were available during my care of the patient were reviewed by me and considered in my medical decision making (see chart for details).  Clinical Course   Pain is much better.  Pt ok for d/c.  She knows to return if worse.  Final Clinical Impressions(s) / ED Diagnoses   Final diagnoses:  Migraine without aura and without status migrainosus, not intractable    New Prescriptions New Prescriptions   NARATRIPTAN (AMERGE) 2.5 MG TABLET    Take 1 tablet (2.5 mg total) by mouth as needed for migraine. Take one (1) tablet at onset of headache; if returns or does not resolve, may repeat after 4 hours; do not exceed five (5) mg in 24 hours.     Isla Pence, MD 11/16/15 [phone]

## 2015-11-16 NOTE — ED Notes (Signed)
Pt reports numbness to L hand and L side of face around 1:45 this afternoon lasting approximately 10 min

## 2015-11-16 NOTE — ED Triage Notes (Signed)
Headache for a week. Hx of migraines. She has had dizziness during the week.

## 2015-11-16 NOTE — ED Notes (Signed)
MD at bedside. 

## 2015-11-22 ENCOUNTER — Other Ambulatory Visit: Payer: Self-pay

## 2015-11-22 ENCOUNTER — Ambulatory Visit (INDEPENDENT_AMBULATORY_CARE_PROVIDER_SITE_OTHER): Payer: 59 | Admitting: Osteopathic Medicine

## 2015-11-22 ENCOUNTER — Encounter: Payer: Self-pay | Admitting: Osteopathic Medicine

## 2015-11-22 ENCOUNTER — Telehealth: Payer: Self-pay

## 2015-11-22 VITALS — BP 120/82 | HR 109 | Ht 62.0 in | Wt 172.0 lb

## 2015-11-22 DIAGNOSIS — R7989 Other specified abnormal findings of blood chemistry: Secondary | ICD-10-CM

## 2015-11-22 DIAGNOSIS — G43109 Migraine with aura, not intractable, without status migrainosus: Secondary | ICD-10-CM

## 2015-11-22 DIAGNOSIS — E785 Hyperlipidemia, unspecified: Secondary | ICD-10-CM

## 2015-11-22 DIAGNOSIS — E039 Hypothyroidism, unspecified: Secondary | ICD-10-CM

## 2015-11-22 DIAGNOSIS — I10 Essential (primary) hypertension: Secondary | ICD-10-CM

## 2015-11-22 MED ORDER — NARATRIPTAN HCL 2.5 MG PO TABS: 2.5000 mg | ORAL_TABLET | ORAL | 2 refills | Status: DC | PRN

## 2015-11-22 MED ORDER — NARATRIPTAN HCL 2.5 MG PO TABS: 2.5000 mg | ORAL_TABLET | ORAL | 1 refills | Status: DC | PRN

## 2015-11-22 NOTE — Patient Instructions (Addendum)
Continue the naratriptan as directed. If headaches get worse or change, let me know right away, otherwise let's plan follow-up with neurology. I will reach out to them to see if they want me to get any imaging ahead of time or if they would rather perform a history & physical exam prior to ordering any imaging. See below for reasons to go to ER/care ASAP.     Migraine Headache A migraine headache is an intense, throbbing pain on one or both sides of your head. A migraine can last for 30 minutes to several hours. CAUSES  The exact cause of a migraine headache is not always known. However, a migraine may be caused when nerves in the brain become irritated and release chemicals that cause inflammation. This causes pain. Certain things may also trigger migraines, such as:  Alcohol.  Smoking.  Stress.  Menstruation.  Aged cheeses.  Foods or drinks that contain nitrates, glutamate, aspartame, or tyramine.  Lack of sleep.  Chocolate.  Caffeine.  Hunger.  Physical exertion.  Fatigue.  Medicines used to treat chest pain (nitroglycerine), birth control pills, estrogen, and some blood pressure medicines. SIGNS AND SYMPTOMS  Pain on one or both sides of your head.  Pulsating or throbbing pain.  Severe pain that prevents daily activities.  Pain that is aggravated by any physical activity.  Nausea, vomiting, or both.  Dizziness.  Pain with exposure to bright lights, loud noises, or activity.  General sensitivity to bright lights, loud noises, or smells. Before you get a migraine, you may get warning signs that a migraine is coming (aura). An aura may include:  Seeing flashing lights.  Seeing bright spots, halos, or zigzag lines.  Having tunnel vision or blurred vision.  Having feelings of numbness or tingling.  Having trouble talking.  Having muscle weakness. DIAGNOSIS  A migraine headache is often diagnosed based on:  Symptoms.  Physical exam.  A CT scan or  MRI of your head. These imaging tests cannot diagnose migraines, but they can help rule out other causes of headaches. TREATMENT Medicines may be given for pain and nausea. Medicines can also be given to help prevent recurrent migraines.  HOME CARE INSTRUCTIONS  Only take over-the-counter or prescription medicines for pain or discomfort as directed by your health care provider. The use of long-term narcotics is not recommended.  Lie down in a dark, quiet room when you have a migraine.  Keep a journal to find out what may trigger your migraine headaches. For example, write down:  What you eat and drink.  How much sleep you get.  Any change to your diet or medicines.  Limit alcohol consumption.  Quit smoking if you smoke.  Get 7-9 hours of sleep, or as recommended by your health care provider.  Limit stress.  Keep lights dim if bright lights bother you and make your migraines worse. SEEK IMMEDIATE MEDICAL CARE IF:   Your migraine becomes severe.  You have a fever.  You have a stiff neck.  You have vision loss.  You have muscular weakness or loss of muscle control.  You start losing your balance or have trouble walking.  You feel faint or pass out.  You have severe symptoms that are different from your first symptoms. MAKE SURE YOU:   Understand these instructions.  Will watch your condition.  Will get help right away if you are not doing well or get worse.   This information is not intended to replace advice given to you by  your health care provider. Make sure you discuss any questions you have with your health care provider.   Document Released: 04/07/2005 Document Revised: 04/28/2014 Document Reviewed: 12/13/2012 Elsevier Interactive Patient Education [employer]

## 2015-11-22 NOTE — Progress Notes (Signed)
HPI: Cristina Zimmerman is a 30 y.o. Not Hispanic or Latino female  who presents to [facility] today, 11/22/15,  for chief complaint of:  Chief Complaint  Patient presents with  . Hospitalization Follow-up     migraine Referral for Neurologist    Worsening migraine . Context: Recently seen and treated in emergency room 11/16/2015, for migraine. Given injection of Reglan, Benadryl, Toradol. Discharge with prescription for naratriptan. CT head, normal. Here today to discuss possible neurology follow-up . Severity: worse than usual . Duration: migraine for about a week prior to ER visit, waxing and waning.  . Modifying factors: never had Rx for migraines, usually helped with OTC medications. Meds givenin ER were helpful but HA didn't totally resolve, she took the naritriptine, but been taking this about 2 per day since its Rx. Yesterday aura again and added Ibuprofen. Feels like a headache hangover at this point. No more numbness.  . Assoc signs/symptoms: (+) aura - halos, zigzags, vision blurred. Prior to going to ER last week, L hand felt numb and then L side of face/jaw went numb at work so she went to ER. 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 Also experienced face flushing, Memory problems. L sided numbness lasted <30 mins but felt weak.   Patient is accompanied by mother who assists with history-taking.   Past medical, surgical, social and family history reviewed: Past Medical History:  Diagnosis Date  . Allergy   . Anxiety   . Hematuria   . Hip dysplasia   . Hyperlipidemia   . Hypertension   . NASH (nonalcoholic steatohepatitis)   . Osteoporosis   . Scoliosis    minimal  . Thyroid disease   . Turner syndrome   . Vitamin D deficiency    Past Surgical History:  Procedure Laterality Date  . BILATERAL OOPHORECTOMY    . HERNIA REPAIR     x2  . HIP SURGERY    .  TONSILLECTOMY    . TYMPANOSTOMY TUBE PLACEMENT     Social History  Substance Use Topics  . Smoking status: Never Smoker  . Smokeless tobacco: Never Used  . Alcohol use Yes     Comment: socially   Family History  Problem Relation Age of Onset  . Hypertension Mother   . Arthritis Mother   . Fibromyalgia Mother   . Hyperlipidemia Mother   . Diabetes Father   . Diabetes Paternal Grandmother   . Stroke Paternal Grandmother   . Kidney disease Paternal Grandmother   . Heart attack Paternal Grandmother   . Hypertension Paternal Grandmother   . Hyperlipidemia Paternal Grandmother   . Diabetes Paternal Grandfather   . Alcohol abuse Maternal Grandfather   . Cancer Maternal Grandfather     BRAIN     Current medication list and allergy/intolerance information reviewed:   Current Outpatient Prescriptions  Medication Sig Dispense Refill  . albuterol (PROVENTIL HFA;VENTOLIN HFA) 108 (90 Base) MCG/ACT inhaler Inhale 2 puffs into the lungs every 6 (six) hours as needed for wheezing. 1 Inhaler 0  . amLODipine (NORVASC) 5 MG tablet Take 1 tablet (5 mg total) by mouth daily. 90 tablet 1  . escitalopram (LEXAPRO) 10 MG tablet Take 1 tablet (10 mg total) by mouth daily. 90 tablet 1  . estrogens, conjugated, (PREMARIN) 1.25 MG tablet Take 1 tablet (1.25 mg total) by mouth daily. 90 tablet 1  . fexofenadine (ALLEGRA)  60 MG tablet Take 60 mg by mouth daily.      . fluticasone (FLONASE) 50 MCG/ACT nasal spray Place two sprays in each nostril once daily 16 g 1  . Fluticasone-Salmeterol (ADVAIR DISKUS) 100-50 MCG/DOSE AEPB Inhale 1 puff into the lungs 2 (two) times daily. Inhale 1 puff into lungs bid 60 each 3  . levothyroxine (SYNTHROID, LEVOTHROID) 100 MCG tablet Take 1 tablet (100 mcg total) by mouth daily before breakfast. 90 tablet 1  . medroxyPROGESTERone (PROVERA) 10 MG tablet Take 1 tablet (10 mg total) by mouth daily. 10 days per month 30 tablet 1  . Multiple Vitamin (MULTIVITAMIN) tablet Take 1  tablet by mouth daily.      . naratriptan (AMERGE) 2.5 MG tablet Take 1 tablet (2.5 mg total) by mouth as needed for migraine. Take one (1) tablet at onset of headache; if returns or does not resolve, may repeat after 4 hours; do not exceed five (5) mg in 24 hours. 10 tablet 0   No current facility-administered medications for this visit.    Allergies  Allergen Reactions  . Prednisone     PO prednisone only- Jittery, anxious, elevated blood pressure.   Pt does "okay" with steroid injections      Review of Systems:  Constitutional:  No  fever, no chills, No recent illness,.   HEENT: (+) headache as per HPI, feeling better today overall, no vision change, no hearing change, No sore throat, No  sinus pressure  Cardiac: No  chest pain  Respiratory:  No  shortness of breath. No   Gastrointestinal: No  abdominal pain, No  nausea, No  vomiting  Skin: No  Rash  Neurologic: No  weakness, No  dizziness, No  slurred speech/focal weakness/facial droop, headache as per HPI  Psychiatric: No  concerns with depression, No  concerns with anxiety, No sleep problems, No mood problems  Exam:  BP 120/82   Pulse (!) 109   Ht 5\' 2"  (1.575 m)   Wt 172 lb (78 kg)   BMI 31.46 kg/m   Constitutional: VS see above. General Appearance: alert, well-developed, well-nourished, NAD  Eyes: Normal lids and conjunctive, non-icteric sclera  Ears, Nose, Mouth, Throat: MMM, Normal external inspection ears/nares/mouth/lips/gums. TM normal bilaterally. Pharynx/tonsils no erythema, no exudate. Nasal mucosa normal. EOMI, PERRLA  Neck: No masses, trachea midline. No thyroid enlargement. No tenderness/mass   Respiratory: Normal respiratory effort. no wheeze, no rhonchi, no rales  Cardiovascular: S1/S2 normal, no murmur, no rub/gallop auscultated. RRR.   Musculoskeletal: Gait normal. No clubbing/cyanosis of digits. Strength 5 out of 5 in all forks or me, motor intact and symmetric  Neurological: No cranial  nerve deficit on limited exam.. Cerebellar reflexes intact. Normal balance/coordination. No tremor.   Psychiatric: Normal judgment/insight. Normal mood and affect. Oriented x3.     ASSESSMENT/PLAN:   We'll place referral to neurology per patient request, in the meantime continue triptan therapy. ER precautions reviewed.   Given change in character and severity of migraine along with neurologic symptoms, some concern for whether additional imaging is required at this point. Will confer with neurology to see if they want MRI and/or MR a performed head of the patient visit or if he would prefer to do history and physical before ordering any imaging, this plan was relayed to the patient and she verbalized agreement and understanding of ER precautions.  Migraine with aura and without status migrainosus, not intractable - Plan: Ambulatory referral to Neurology     Visit summary with  medication list and pertinent instructions was printed for patient to review. All questions at time of visit were answered - patient instructed to contact office with any additional concerns. ER/RTC precautions were reviewed with the patient. Follow-up plan: Return if symptoms worsen or fail to improve, and 02/2016 for routine 42-month followup form last visit.  Note: Total time spent 25 minutes, greater than 50% of the visit was spent face-to-face counseling and coordinating care for the following: The encounter diagnosis was Migraine with aura and without status migrainosus, not intractable.Marland Kitchen

## 2015-11-22 NOTE — Telephone Encounter (Signed)
See progress note, I had some difficulty getting the prescription to prevent, I modified the instructions so that they will be brief enough to send directly to the pharmacy through E-prescriptions. Let me know if any other problems

## 2015-11-22 NOTE — Telephone Encounter (Signed)
Patient called stated that her insurance will only cover 12 of the Naratriptan 2.36m. Imitrex is the only medication that the insurance will cover for a 30 day supply. Anything else is on a quantity of 12. Rhonda Cunningham,CMA

## 2015-11-27 ENCOUNTER — Telehealth: Payer: Self-pay | Admitting: *Deleted

## 2015-11-27 ENCOUNTER — Telehealth: Payer: Self-pay | Admitting: Osteopathic Medicine

## 2015-11-27 NOTE — Telephone Encounter (Signed)
PA initiated through covermymeds for premeran

## 2015-11-28 ENCOUNTER — Encounter (HOSPITAL_BASED_OUTPATIENT_CLINIC_OR_DEPARTMENT_OTHER): Payer: Self-pay

## 2015-11-28 ENCOUNTER — Emergency Department (HOSPITAL_BASED_OUTPATIENT_CLINIC_OR_DEPARTMENT_OTHER)
Admission: EM | Admit: 2015-11-28 | Discharge: 2015-11-28 | Disposition: A | Discharge status: Home / Self Care | Payer: 59 | Attending: Emergency Medicine | Admitting: Emergency Medicine

## 2015-11-28 ENCOUNTER — Emergency Department (HOSPITAL_COMMUNITY): Payer: 59

## 2015-11-28 DIAGNOSIS — R51 Headache: Secondary | ICD-10-CM

## 2015-11-28 DIAGNOSIS — I10 Essential (primary) hypertension: Secondary | ICD-10-CM | POA: Insufficient documentation

## 2015-11-28 DIAGNOSIS — R519 Headache, unspecified: Secondary | ICD-10-CM

## 2015-11-28 DIAGNOSIS — R200 Anesthesia of skin: Secondary | ICD-10-CM | POA: Diagnosis present

## 2015-11-28 DIAGNOSIS — G43109 Migraine with aura, not intractable, without status migrainosus: Secondary | ICD-10-CM | POA: Diagnosis not present

## 2015-11-28 DIAGNOSIS — Z79899 Other long term (current) drug therapy: Secondary | ICD-10-CM | POA: Diagnosis not present

## 2015-11-28 HISTORY — DX: Migraine, unspecified, not intractable, without status migrainosus: G43.909

## 2015-11-28 LAB — CBC WITH DIFFERENTIAL/PLATELET
Basophils Absolute: 0 10*3/uL (ref 0.0–0.1)
Basophils Relative: 1 %
Eosinophils Absolute: 0.1 10*3/uL (ref 0.0–0.7)
Eosinophils Relative: 2 %
HEMATOCRIT: 42.8 % (ref 36.0–46.0)
HEMOGLOBIN: 14.7 g/dL (ref 12.0–15.0)
LYMPHS ABS: 2.1 10*3/uL (ref 0.7–4.0)
LYMPHS PCT: 38 %
MCH: 29.7 pg (ref 26.0–34.0)
MCHC: 34.3 g/dL (ref 30.0–36.0)
MCV: 86.5 fL (ref 78.0–100.0)
MONO ABS: 0.5 10*3/uL (ref 0.1–1.0)
MONOS PCT: 10 %
NEUTROS ABS: 2.8 10*3/uL (ref 1.7–7.7)
NEUTROS PCT: 51 %
Platelets: 223 10*3/uL (ref 150–400)
RBC: 4.95 MIL/uL (ref 3.87–5.11)
RDW: 13 % (ref 11.5–15.5)
WBC: 5.5 10*3/uL (ref 4.0–10.5)

## 2015-11-28 LAB — COMPREHENSIVE METABOLIC PANEL
ALBUMIN: 4.5 g/dL (ref 3.5–5.0)
ALK PHOS: 72 U/L (ref 38–126)
ALT: 97 U/L — ABNORMAL HIGH (ref 14–54)
ANION GAP: 8 (ref 5–15)
AST: 62 U/L — ABNORMAL HIGH (ref 15–41)
BUN: 11 mg/dL (ref 6–20)
CHLORIDE: 104 mmol/L (ref 101–111)
CO2: 29 mmol/L (ref 22–32)
Calcium: 9.5 mg/dL (ref 8.9–10.3)
Creatinine, Ser: 0.75 mg/dL (ref 0.44–1.00)
GFR calc Af Amer: >60 mL/min (ref >60)
GFR calc non Af Amer: >60 mL/min (ref >60)
GLUCOSE: 106 mg/dL — AB (ref 65–99)
POTASSIUM: 3.9 mmol/L (ref 3.5–5.1)
SODIUM: 141 mmol/L (ref 135–145)
Total Bilirubin: 0.7 mg/dL (ref 0.3–1.2)
Total Protein: 7.5 g/dL (ref 6.5–8.1)

## 2015-11-28 LAB — SEDIMENTATION RATE: Sed Rate: 3 mm/hr (ref 0–22)

## 2015-11-28 LAB — HCG, SERUM, QUALITATIVE: Preg, Serum: POSITIVE — AB

## 2015-11-28 NOTE — ED Provider Notes (Signed)
[facility] Provider Note   CSN: [id number] Arrival date & time: 11/28/15  1643  First Provider Contact:  First MD Initiated Contact with Patient 11/28/15 1705        History   Chief Complaint Chief Complaint  Patient presents with  . Numbness    HPI Cristina Zimmerman is a 30 y.o. female.  HPI Patient has been having migraine headaches since young age. Over the past 2 weeks however associated symptoms have changed significantly. Patient developed first left sided arm numbness and facial numbness which was new for her. She was seen in the emergency department and her CT head was negative. Symptoms did resolve with migraine therapy. She continued to have episodic severe headaches with waxing and waning left-sided numbness sensation. Today however she developed near complete visual loss which she reports was predominantly scintillations but to the point that she was unable to focus her vision in either eye. Subsequent to that she developed right sided arm and face numbness which had never occurred previously. She did take ibuprofen and the naratriptan as prescribed. She reports after about 45 minutes the right arm symptoms began to resolve. She reports she had some residual facial numbness which at this point has nearly as well. Past Medical History:  Diagnosis Date  . Allergy   . Anxiety   . Hematuria   . Hip dysplasia   . Hyperlipidemia   . Hypertension   . Migraine   . NASH (nonalcoholic steatohepatitis)   . Osteoporosis   . Scoliosis    minimal  . Thyroid disease   . Turner syndrome   . Vitamin D deficiency     Patient Active Problem List   Diagnosis Date Noted  . Migraine with aura and without status migrainosus, not intractable 11/22/2015  . Hypothyroidism 07/22/2012  . Childhood asthma 07/20/2012  . Bronchospasm 07/20/2012  . Allergy   . Anxiety   . Turner syndrome   . Hip dysplasia   . Vitamin D deficiency   . Hematuria   . Hyperlipidemia   .  Hypertension   . Osteoporosis   . NASH (nonalcoholic steatohepatitis)   . Scoliosis     Past Surgical History:  Procedure Laterality Date  . BILATERAL OOPHORECTOMY    . HERNIA REPAIR     x2  . HIP SURGERY    . TONSILLECTOMY    . TYMPANOSTOMY TUBE PLACEMENT      OB History    No data available       Home Medications    Prior to Admission medications   Medication Sig Start Date End Date Taking? Authorizing Provider  albuterol (PROVENTIL HFA;VENTOLIN HFA) 108 (90 Base) MCG/ACT inhaler Inhale 2 puffs into the lungs every 6 (six) hours as needed for wheezing. 05/24/15   Noland Fordyce, PA-C  amLODipine (NORVASC) 5 MG tablet Take 1 tablet (5 mg total) by mouth daily. 09/13/15   Emeterio Reeve, DO  escitalopram (LEXAPRO) 10 MG tablet Take 1 tablet (10 mg total) by mouth daily. 09/13/15   Emeterio Reeve, DO  estrogens, conjugated, (PREMARIN) 1.25 MG tablet Take 1 tablet (1.25 mg total) by mouth daily. 09/13/15   Emeterio Reeve, DO  fexofenadine (ALLEGRA) 60 MG tablet Take 60 mg by mouth daily.      Historical Provider, MD  fluticasone Asencion Islam) 50 MCG/ACT nasal spray Place two sprays in each nostril once daily 05/24/15   Noland Fordyce, PA-C  Fluticasone-Salmeterol (ADVAIR DISKUS) 100-50 MCG/DOSE AEPB Inhale 1 puff into the lungs 2 (two)  times daily. Inhale 1 puff into lungs bid 09/13/15   Emeterio Reeve, DO  levothyroxine (SYNTHROID, LEVOTHROID) 100 MCG tablet Take 1 tablet (100 mcg total) by mouth daily before breakfast. 09/13/15   Emeterio Reeve, DO  medroxyPROGESTERone (PROVERA) 10 MG tablet Take 1 tablet (10 mg total) by mouth daily. 10 days per month 09/13/15   Emeterio Reeve, DO  Multiple Vitamin (MULTIVITAMIN) tablet Take 1 tablet by mouth daily.      Historical Provider, MD  naratriptan (AMERGE) 2.5 MG tablet Take 1 tablet (2.5 mg total) by mouth as needed (migraine onset, can repeat dose in 4 hr if need, MAX 2 tablet/24 hr). 11/22/15   Emeterio Reeve, DO    Family  History Family History  Problem Relation Age of Onset  . Hypertension Mother   . Arthritis Mother   . Fibromyalgia Mother   . Hyperlipidemia Mother   . Diabetes Father   . Diabetes Paternal Grandmother   . Stroke Paternal Grandmother   . Kidney disease Paternal Grandmother   . Heart attack Paternal Grandmother   . Hypertension Paternal Grandmother   . Hyperlipidemia Paternal Grandmother   . Diabetes Paternal Grandfather   . Alcohol abuse Maternal Grandfather   . Cancer Maternal Grandfather     BRAIN    Social History Social History  Substance Use Topics  . Smoking status: Never Smoker  . Smokeless tobacco: Never Used  . Alcohol use Yes     Comment: socially     Allergies   Prednisone   Review of Systems Review of Systems 10 Systems reviewed and are negative for acute change except as noted in the HPI.   Physical Exam Updated Vital Signs BP (!) 139/102 (BP Location: Right Arm)   Pulse 103   Temp 98.4 F (36.9 C) (Oral)   Resp 18   Ht 5' 2"  (1.575 m)   Wt 170 lb (77.1 kg)   SpO2 98%   BMI 31.09 kg/m   Physical Exam  Constitutional: She is oriented to person, place, and time. She appears well-developed and well-nourished. No distress.  Patient is alert and nontoxic. Clinical appearance as well.  HENT:  Head: Normocephalic and atraumatic.  Right Ear: External ear normal.  Left Ear: External ear normal.  Mouth/Throat: Oropharynx is clear and moist.  Eyes: Conjunctivae and EOM are normal. Pupils are equal, round, and reactive to light.  Neck: Neck supple.  Cardiovascular: Normal rate and regular rhythm.   No murmur heard. Pulmonary/Chest: Effort normal and breath sounds normal. No respiratory distress.  Abdominal: Soft. There is no tenderness.  Musculoskeletal: She exhibits no edema, tenderness or deformity.  Neurological: She is alert and oriented to person, place, and time. No cranial nerve deficit. She exhibits normal muscle tone. Coordination normal.    Cognitive function is normal. At this time patient does not perceive sensory differential from left to right on upper or lower extremities. Normal finger-nose examination bilaterally. Normal extremity strength testing.  Skin: Skin is warm and dry.  Psychiatric: She has a normal mood and affect.  Nursing note and vitals reviewed.    ED Treatments / Results  Labs (all labs ordered are listed, but only abnormal results are displayed) Labs Reviewed  COMPREHENSIVE METABOLIC PANEL  CBC WITH DIFFERENTIAL/PLATELET  SEDIMENTATION RATE  HCG, SERUM, QUALITATIVE    EKG  EKG Interpretation None       Radiology No results found.  Procedures Procedures (including critical care time)  Medications Ordered in ED Medications - No data to  display   Initial Impression / Assessment and Plan / ED Course  I have reviewed the triage vital signs and the nursing notes.  Pertinent labs & imaging results that were available during my care of the patient were reviewed by me and considered in my medical decision making (see chart for details).  Clinical Course   Consult: (17:20). Case is reviewed with Dr. Alfonzo Beers for transfer to [facility] emergency department. Patient will be transferred for [facility] for new neurologic deficits with headache..  Final Clinical Impressions(s) / ED Diagnoses   Final diagnoses:  Right arm numbness  Nonintractable episodic headache, unspecified headache type   Patient has strong history of migraine headaches. She however does not have history of extremity or facial neurologic dysfunction until 2 weeks ago. The symptoms were occurring consistently on the left. Today symptoms have involved the right. Headaches have been more severe than her prior headaches and lasted longer. At this point, with change in headache pattern and new neurologic symptoms, I feel the patient needs MRI/MRA to rule out CVA or cerebral aneurysm or possibly MS. If there are no abnormal  findings on the studies, I do feel patient is safe to continue her treatment for migraine headaches. She has neurology follow-up arranged for the beginning of next week. Patient symptoms have resolved at this time. I do feel she is stable for transfer by private vehicle to [facility]   No medications on file     Charlesetta Shanks, MD 11/28/15 1734

## 2015-11-28 NOTE — ED Provider Notes (Signed)
[facility]-EMERGENCY DEPT Provider Note   CSN: [id number] Arrival date & time: 11/28/15  1643  First Provider Contact:  First MD Initiated Contact with Patient 11/28/15 1705        History   Chief Complaint Chief Complaint  Patient presents with  . Numbness    HPI Cristina Zimmerman is a 30 y.o. female.Patient has had intermittent headaches typical of migraine preceded by our of bright lights for the past 2 weeks. Today she developed numbness going up her left arm and the left side of her face. She's had similar episodes several times over the past 2 weeks. Coming by headache. She treated herself with naratriptan and with ibuprofen with relief. She is presently asymptomatic. She was seen at [facility] ED earlier today sent here for further evaluation and for MRI MRA  HPI  Past Medical History:  Diagnosis Date  . Allergy   . Anxiety   . Hematuria   . Hip dysplasia   . Hyperlipidemia   . Hypertension   . Migraine   . NASH (nonalcoholic steatohepatitis)   . Osteoporosis   . Scoliosis    minimal  . Thyroid disease   . Turner syndrome   . Vitamin D deficiency     Patient Active Problem List   Diagnosis Date Noted  . Migraine with aura and without status migrainosus, not intractable 11/22/2015  . Hypothyroidism 07/22/2012  . Childhood asthma 07/20/2012  . Bronchospasm 07/20/2012  . Allergy   . Anxiety   . Turner syndrome   . Hip dysplasia   . Vitamin D deficiency   . Hematuria   . Hyperlipidemia   . Hypertension   . Osteoporosis   . NASH (nonalcoholic steatohepatitis)   . Scoliosis     Past Surgical History:  Procedure Laterality Date  . BILATERAL OOPHORECTOMY    . HERNIA REPAIR     x2  . HIP SURGERY    . TONSILLECTOMY    . TYMPANOSTOMY TUBE PLACEMENT      OB History    No data available       Home Medications    Prior to Admission medications   Medication Sig Start Date End Date Taking? Authorizing Provider  albuterol (PROVENTIL HFA;VENTOLIN  HFA) 108 (90 Base) MCG/ACT inhaler Inhale 2 puffs into the lungs every 6 (six) hours as needed for wheezing. 05/24/15   Junius Finner, PA-C  amLODipine (NORVASC) 5 MG tablet Take 1 tablet (5 mg total) by mouth daily. 09/13/15   Sunnie Nielsen, DO  escitalopram (LEXAPRO) 10 MG tablet Take 1 tablet (10 mg total) by mouth daily. 09/13/15   Sunnie Nielsen, DO  estrogens, conjugated, (PREMARIN) 1.25 MG tablet Take 1 tablet (1.25 mg total) by mouth daily. 09/13/15   Sunnie Nielsen, DO  fexofenadine (ALLEGRA) 60 MG tablet Take 60 mg by mouth daily.      Historical Provider, MD  fluticasone Aleda Grana) 50 MCG/ACT nasal spray Place two sprays in each nostril once daily 05/24/15   Junius Finner, PA-C  Fluticasone-Salmeterol (ADVAIR DISKUS) 100-50 MCG/DOSE AEPB Inhale 1 puff into the lungs 2 (two) times daily. Inhale 1 puff into lungs bid 09/13/15   Sunnie Nielsen, DO  levothyroxine (SYNTHROID, LEVOTHROID) 100 MCG tablet Take 1 tablet (100 mcg total) by mouth daily before breakfast. 09/13/15   Sunnie Nielsen, DO  medroxyPROGESTERone (PROVERA) 10 MG tablet Take 1 tablet (10 mg total) by mouth daily. 10 days per month 09/13/15   Sunnie Nielsen, DO  Multiple Vitamin (MULTIVITAMIN) tablet Take 1  tablet by mouth daily.      Historical Provider, MD  naratriptan (AMERGE) 2.5 MG tablet Take 1 tablet (2.5 mg total) by mouth as needed (migraine onset, can repeat dose in 4 hr if need, MAX 2 tablet/24 hr). 11/22/15   Sunnie Nielsen, DO    Family History Family History  Problem Relation Age of Onset  . Hypertension Mother   . Arthritis Mother   . Fibromyalgia Mother   . Hyperlipidemia Mother   . Diabetes Father   . Diabetes Paternal Grandmother   . Stroke Paternal Grandmother   . Kidney disease Paternal Grandmother   . Heart attack Paternal Grandmother   . Hypertension Paternal Grandmother   . Hyperlipidemia Paternal Grandmother   . Diabetes Paternal Grandfather   . Alcohol abuse Maternal Grandfather   .  Cancer Maternal Grandfather     BRAIN    Social History Social History  Substance Use Topics  . Smoking status: Never Smoker  . Smokeless tobacco: Never Used  . Alcohol use Yes     Comment: socially     Allergies   Prednisone   Review of Systems Review of Systems  Constitutional: Negative.   Respiratory: Negative.   Cardiovascular: Negative.   Gastrointestinal: Negative.   Musculoskeletal: Negative.   Skin: Negative.   Neurological: Positive for numbness and headaches.  Psychiatric/Behavioral: Negative.   All other systems reviewed and are negative.    Physical Exam Updated Vital Signs BP 143/95   Pulse 106   Temp 98.4 F (36.9 C) (Oral)   Resp 26   Ht 5\' 2"  (1.575 m)   Wt 170 lb (77.1 kg)   SpO2 96%   BMI 31.09 kg/m   Physical Exam  Constitutional: She appears well-developed and well-nourished.  HENT:  Head: Normocephalic and atraumatic.  Eyes: Conjunctivae are normal. Pupils are equal, round, and reactive to light.  Neck: Neck supple. No tracheal deviation present. No thyromegaly present.  Cardiovascular: Normal rate and regular rhythm.   No murmur heard. Pulmonary/Chest: Effort normal and breath sounds normal.  Abdominal: Soft. Bowel sounds are normal. She exhibits no distension. There is no tenderness.  Musculoskeletal: Normal range of motion. She exhibits no edema or tenderness.  Neurological: She is alert. She has normal reflexes. Coordination normal.  DTRs symmetric bilaterally at knee jerk ankle jerk and biceps toes or going bilaterally gait normal Romberg normal pronator drift normal finger to nose normal motor strength 5 over 5 overall  Skin: Skin is warm and dry. No rash noted.  Psychiatric: She has a normal mood and affect.  Nursing note and vitals reviewed.  Results for orders placed or performed during the hospital encounter of 11/28/15  Comprehensive metabolic panel  Result Value Ref Range   Sodium 141 135 - 145 mmol/L   Potassium 3.9  3.5 - 5.1 mmol/L   Chloride 104 101 - 111 mmol/L   CO2 29 22 - 32 mmol/L   Glucose, Bld 106 (H) 65 - 99 mg/dL   BUN 11 6 - 20 mg/dL   Creatinine, Ser 1.61 0.44 - 1.00 mg/dL   Calcium 9.5 8.9 - 09.6 mg/dL   Total Protein 7.5 6.5 - 8.1 g/dL   Albumin 4.5 3.5 - 5.0 g/dL   AST 62 (H) 15 - 41 U/L   ALT 97 (H) 14 - 54 U/L   Alkaline Phosphatase 72 38 - 126 U/L   Total Bilirubin 0.7 0.3 - 1.2 mg/dL   GFR calc non Af Amer >60 >60 mL/min  GFR calc Af Amer >60 >60 mL/min   Anion gap 8 5 - 15  CBC with Differential  Result Value Ref Range   WBC 5.5 4.0 - 10.5 K/uL   RBC 4.95 3.87 - 5.11 MIL/uL   Hemoglobin 14.7 12.0 - 15.0 g/dL   HCT 16.1 09.6 - 04.5 %   MCV 86.5 78.0 - 100.0 fL   MCH 29.7 26.0 - 34.0 pg   MCHC 34.3 30.0 - 36.0 g/dL   RDW 40.9 81.1 - 91.4 %   Platelets 223 150 - 400 K/uL   Neutrophils Relative % 51 %   Neutro Abs 2.8 1.7 - 7.7 K/uL   Lymphocytes Relative 38 %   Lymphs Abs 2.1 0.7 - 4.0 K/uL   Monocytes Relative 10 %   Monocytes Absolute 0.5 0.1 - 1.0 K/uL   Eosinophils Relative 2 %   Eosinophils Absolute 0.1 0.0 - 0.7 K/uL   Basophils Relative 1 %   Basophils Absolute 0.0 0.0 - 0.1 K/uL  Sedimentation rate  Result Value Ref Range   Sed Rate 3 0 - 22 mm/hr  hCG, serum, qualitative  Result Value Ref Range   Preg, Serum WEAK POSITIVE (A) NEGATIVE   Ct Head Wo Contrast  Result Date: 11/16/2015 CLINICAL DATA:  Headache for 1 week.  Nausea. EXAM: CT HEAD WITHOUT CONTRAST TECHNIQUE: Contiguous axial images were obtained from the base of the skull through the vertex without intravenous contrast. COMPARISON:  May 01, 2010 FINDINGS: Brain: The ventricles are normal in size and configuration. There is no intracranial mass, hemorrhage, extra-axial fluid collection, or midline shift. Gray-white compartments appear normal. No acute infarct evident. Vascular: There is no appreciable hyperdense vessel. There is no evident vascular calcification. Skull: The bony calvarium  appears intact. Sinuses/Orbits: Visualized paranasal sinuses are clear. No intraorbital lesions are evident. Other: Mastoid air cells are clear. IMPRESSION: Study within normal limits. Electronically Signed   By: Bretta Bang III M.D.   On: 11/16/2015 15:49  Mr Brain Wo Contrast (neuro Protocol)  Result Date: 11/28/2015 CLINICAL DATA:  Chronic history migraine headaches with increased associated symptoms over the last 2 weeks. Recent onset left upper extremity numbness and facial numbness. New onset near complete visual loss. New onset right-sided arm and facial numbness today. EXAM: MRI HEAD WITHOUT CONTRAST MRA HEAD WITHOUT CONTRAST TECHNIQUE: Multiplanar, multiecho pulse sequences of the brain and surrounding structures were obtained without intravenous contrast. Angiographic images of the head were obtained using MRA technique without contrast. COMPARISON:  CT head without contrast 11/16/2015. FINDINGS: MRI HEAD FINDINGS No acute infarct, hemorrhage, or mass lesion is present. The ventricles are of normal size. Insert pass fluid No significant white matter disease is present. Internal auditory canals are within normal limits bilaterally. The brainstem and cerebellum are normal. Flow is present in the major intracranial arteries. The globes and orbits are intact. The paranasal sinuses and the mastoid air cells are clear. The skullbase is within normal limits. Midline sagittal images are unremarkable. MRA HEAD FINDINGS The internal carotid arteries are within normal limits from the high cervical segments through the ICA termini bilaterally. The A1 and M1 segments are normal. The anterior communicating artery is patent. ACA and MCA branch vessels are within normal limits. The right vertebral artery is hypoplastic and terminates at the PICA, a normal variant. The left vertebral artery feeds the basilar artery. The left PICA origin is visualized and normal. The left posterior cerebral artery is of fetal type.  A small left P1 segment  is present. The right posterior cerebral artery originates from the basilar tip. PCA branch vessels are within normal limits. IMPRESSION: 1. Normal MRI of the brain. 2. Normal variant MRA circle of Willis without significant proximal stenosis, aneurysm, or branch vessel occlusion. Electronically Signed   By: Marin Roberts M.D.   On: 11/28/2015 20:55   Mr Maxine Glenn Head (cerebral Arteries)  Result Date: 11/28/2015 CLINICAL DATA:  Chronic history migraine headaches with increased associated symptoms over the last 2 weeks. Recent onset left upper extremity numbness and facial numbness. New onset near complete visual loss. New onset right-sided arm and facial numbness today. EXAM: MRI HEAD WITHOUT CONTRAST MRA HEAD WITHOUT CONTRAST TECHNIQUE: Multiplanar, multiecho pulse sequences of the brain and surrounding structures were obtained without intravenous contrast. Angiographic images of the head were obtained using MRA technique without contrast. COMPARISON:  CT head without contrast 11/16/2015. FINDINGS: MRI HEAD FINDINGS No acute infarct, hemorrhage, or mass lesion is present. The ventricles are of normal size. Insert pass fluid No significant white matter disease is present. Internal auditory canals are within normal limits bilaterally. The brainstem and cerebellum are normal. Flow is present in the major intracranial arteries. The globes and orbits are intact. The paranasal sinuses and the mastoid air cells are clear. The skullbase is within normal limits. Midline sagittal images are unremarkable. MRA HEAD FINDINGS The internal carotid arteries are within normal limits from the high cervical segments through the ICA termini bilaterally. The A1 and M1 segments are normal. The anterior communicating artery is patent. ACA and MCA branch vessels are within normal limits. The right vertebral artery is hypoplastic and terminates at the PICA, a normal variant. The left vertebral artery feeds the  basilar artery. The left PICA origin is visualized and normal. The left posterior cerebral artery is of fetal type. A small left P1 segment is present. The right posterior cerebral artery originates from the basilar tip. PCA branch vessels are within normal limits. IMPRESSION: 1. Normal MRI of the brain. 2. Normal variant MRA circle of Willis without significant proximal stenosis, aneurysm, or branch vessel occlusion. Electronically Signed   By: Marin Roberts M.D.   On: 11/28/2015 20:55    ED Treatments / Results  Labs (all labs ordered are listed, but only abnormal results are displayed) Labs Reviewed  COMPREHENSIVE METABOLIC PANEL - Abnormal; Notable for the following:       Result Value   Glucose, Bld 106 (*)    AST 62 (*)    ALT 97 (*)    All other components within normal limits  HCG, SERUM, QUALITATIVE - Abnormal; Notable for the following:    Preg, Serum WEAK POSITIVE (*)    All other components within normal limits  CBC WITH DIFFERENTIAL/PLATELET  SEDIMENTATION RATE    EKG  EKG Interpretation None       Radiology Mr Brain Wo Contrast (neuro Protocol)  Result Date: 11/28/2015 CLINICAL DATA:  Chronic history migraine headaches with increased associated symptoms over the last 2 weeks. Recent onset left upper extremity numbness and facial numbness. New onset near complete visual loss. New onset right-sided arm and facial numbness today. EXAM: MRI HEAD WITHOUT CONTRAST MRA HEAD WITHOUT CONTRAST TECHNIQUE: Multiplanar, multiecho pulse sequences of the brain and surrounding structures were obtained without intravenous contrast. Angiographic images of the head were obtained using MRA technique without contrast. COMPARISON:  CT head without contrast 11/16/2015. FINDINGS: MRI HEAD FINDINGS No acute infarct, hemorrhage, or mass lesion is present. The ventricles are of normal size.  Insert pass fluid No significant white matter disease is present. Internal auditory canals are within  normal limits bilaterally. The brainstem and cerebellum are normal. Flow is present in the major intracranial arteries. The globes and orbits are intact. The paranasal sinuses and the mastoid air cells are clear. The skullbase is within normal limits. Midline sagittal images are unremarkable. MRA HEAD FINDINGS The internal carotid arteries are within normal limits from the high cervical segments through the ICA termini bilaterally. The A1 and M1 segments are normal. The anterior communicating artery is patent. ACA and MCA branch vessels are within normal limits. The right vertebral artery is hypoplastic and terminates at the PICA, a normal variant. The left vertebral artery feeds the basilar artery. The left PICA origin is visualized and normal. The left posterior cerebral artery is of fetal type. A small left P1 segment is present. The right posterior cerebral artery originates from the basilar tip. PCA branch vessels are within normal limits. IMPRESSION: 1. Normal MRI of the brain. 2. Normal variant MRA circle of Willis without significant proximal stenosis, aneurysm, or branch vessel occlusion. Electronically Signed   By: Marin Roberts M.D.   On: 11/28/2015 20:55   Mr Maxine Glenn Head (cerebral Arteries)  Result Date: 11/28/2015 CLINICAL DATA:  Chronic history migraine headaches with increased associated symptoms over the last 2 weeks. Recent onset left upper extremity numbness and facial numbness. New onset near complete visual loss. New onset right-sided arm and facial numbness today. EXAM: MRI HEAD WITHOUT CONTRAST MRA HEAD WITHOUT CONTRAST TECHNIQUE: Multiplanar, multiecho pulse sequences of the brain and surrounding structures were obtained without intravenous contrast. Angiographic images of the head were obtained using MRA technique without contrast. COMPARISON:  CT head without contrast 11/16/2015. FINDINGS: MRI HEAD FINDINGS No acute infarct, hemorrhage, or mass lesion is present. The ventricles are of  normal size. Insert pass fluid No significant white matter disease is present. Internal auditory canals are within normal limits bilaterally. The brainstem and cerebellum are normal. Flow is present in the major intracranial arteries. The globes and orbits are intact. The paranasal sinuses and the mastoid air cells are clear. The skullbase is within normal limits. Midline sagittal images are unremarkable. MRA HEAD FINDINGS The internal carotid arteries are within normal limits from the high cervical segments through the ICA termini bilaterally. The A1 and M1 segments are normal. The anterior communicating artery is patent. ACA and MCA branch vessels are within normal limits. The right vertebral artery is hypoplastic and terminates at the PICA, a normal variant. The left vertebral artery feeds the basilar artery. The left PICA origin is visualized and normal. The left posterior cerebral artery is of fetal type. A small left P1 segment is present. The right posterior cerebral artery originates from the basilar tip. PCA branch vessels are within normal limits. IMPRESSION: 1. Normal MRI of the brain. 2. Normal variant MRA circle of Willis without significant proximal stenosis, aneurysm, or branch vessel occlusion. Electronically Signed   By: Marin Roberts M.D.   On: 11/28/2015 20:55    Procedures Procedures (including critical care time)  Medications Ordered in ED Medications - No data to display   Initial Impression / Assessment and Plan / ED Course  I have reviewed the triage vital signs and the nursing notes.  Pertinent labs & imaging results that were available during my care of the patient were reviewed by me and considered in my medical decision making (see chart for details).  Clinical Course   No evidence of  stroke. Symptoms, MRI and MRA consistent with migraine headache. An patient to keep her scheduled appointment with neurologist next week. Blood pressure recheck 3 weeks  Final  Clinical Impressions(s) / ED Diagnoses   Final diagnoses:  Right arm numbness  Nonintractable episodic headache, unspecified headache type   Diagnoses #1 migraine headache with aura #2 elevated blood pressure New Prescriptions New Prescriptions   No medications on file     Doug SouSam Kensy Blizard, MD 11/28/15 2131

## 2015-11-28 NOTE — Discharge Instructions (Signed)
Keep your scheduled plan with your neurologist next week. Get your blood pressure recheck within the next 3 weeks. Today's was mildly elevated at 143/95

## 2015-11-28 NOTE — ED Triage Notes (Signed)
C/o bilat numbness, visual changes today-denies at present-hx of same with migraine-denies pain-took med PTA-NAD-steady gait

## 2015-11-28 NOTE — ED Notes (Signed)
Patient transported to MRI 

## 2015-11-29 NOTE — Telephone Encounter (Signed)
Called patient she stated that she has used all of her Naratriptan 2.5 mg she stated that insurance will only cover 12 pills a month so she has to wait a month before she can get another refill. She is requesting a Rx for Imitrex which her insurance will cover a 30 day supply. Please advise. Rhonda Cunningham,CMA

## 2015-11-29 NOTE — Telephone Encounter (Signed)
Called patient to advise of information below. Unable to leave a message due to patient vm being full. Sabrie Moritz,CMA

## 2015-11-29 NOTE — Telephone Encounter (Signed)
Okay, I can send in Imitrex. Also let her know that I just heard back about her appointment with Dr. Lucia GaskinsAhern, but I see that she has already been in the emergency room and they performed an MRI and MRA which were normal. They beat me to it!   If she is having severe symptoms not controlled by her home medications, she can always come here and we can try injectable medications as needed, of course if she is having any concerning neurologic symptoms then she should seek care wherever she is able to if after hours.

## 2015-11-30 NOTE — Telephone Encounter (Signed)
Spoke to patient gave her information as noted below. Rhonda Cunningham,CMA  

## 2015-12-03 ENCOUNTER — Encounter: Payer: Self-pay | Admitting: Neurology

## 2015-12-03 ENCOUNTER — Ambulatory Visit (INDEPENDENT_AMBULATORY_CARE_PROVIDER_SITE_OTHER): Payer: 59 | Admitting: Neurology

## 2015-12-03 VITALS — BP 119/82 | HR 113 | Ht 62.0 in | Wt 174.0 lb

## 2015-12-03 DIAGNOSIS — G43101 Migraine with aura, not intractable, with status migrainosus: Secondary | ICD-10-CM

## 2015-12-03 DIAGNOSIS — G43701 Chronic migraine without aura, not intractable, with status migrainosus: Secondary | ICD-10-CM

## 2015-12-03 MED ORDER — ONDANSETRON 4 MG PO TBDP: ORAL_TABLET | ORAL | 12 refills | Status: DC

## 2015-12-03 MED ORDER — TOPIRAMATE 25 MG PO TABS: 100.0000 mg | ORAL_TABLET | Freq: Every day | ORAL | 12 refills | Status: DC

## 2015-12-03 MED ORDER — TIZANIDINE HCL 4 MG PO TABS: ORAL_TABLET | ORAL | 1 refills | Status: DC

## 2015-12-03 NOTE — Patient Instructions (Addendum)
Remember to drink plenty of fluid, eat healthy meals and do not skip any meals. Try to eat protein with a every meal and eat a healthy snack such as fruit or nuts in between meals. Try to keep a regular sleep-wake schedule and try to exercise daily, particularly in the form of walking, 20-30 minutes a day, if you can.   As far as your medications are concerned, I would like to suggest: Start Topiramate at night 1 pill. Every week increase by one pill until taking 4 pills at night. At onset of migraine take imitrex and zofran. May repeat in 2 hours once Tizanidine up to 3x a day for headache. Stop daily OTC medications  Our phone number is [phone]. We also have an after hours call service for urgent matters and there is a physician on-call for urgent questions. For any emergencies you know to call 911 or go to the nearest emergency room  Topiramate tablets What is this medicine? TOPIRAMATE (toe PYRE a mate) is used to treat seizures in adults or children with epilepsy. It is also used for the prevention of migraine headaches. This medicine may be used for other purposes; ask your health care provider or pharmacist if you have questions. What should I tell my health care provider before I take this medicine? They need to know if you have any of these conditions: -bleeding disorders -cirrhosis of the liver or liver disease -diarrhea -glaucoma -kidney stones or kidney disease -low blood counts, like low white cell, platelet, or red cell counts -lung disease like asthma, obstructive pulmonary disease, emphysema -metabolic acidosis -on a ketogenic diet -schedule for surgery or a procedure -suicidal thoughts, plans, or attempt; a previous suicide attempt by you or a family member -an unusual or allergic reaction to topiramate, other medicines, foods, dyes, or preservatives -pregnant or trying to get pregnant -breast-feeding How should I use this medicine? Take this medicine by mouth with  a glass of water. Follow the directions on the prescription label. Do not crush or chew. You may take this medicine with meals. Take your medicine at regular intervals. Do not take it more often than directed. Talk to your pediatrician regarding the use of this medicine in children. Special care may be needed. While this drug may be prescribed for children as young as 11 years of age for selected conditions, precautions do apply. Overdosage: If you think you have taken too much of this medicine contact a poison control center or emergency room at once. NOTE: This medicine is only for you. Do not share this medicine with others. What if I miss a dose? If you miss a dose, take it as soon as you can. If your next dose is to be taken in less than 6 hours, then do not take the missed dose. Take the next dose at your regular time. Do not take double or extra doses. What may interact with this medicine? Do not take this medicine with any of the following medications: -probenecid This medicine may also interact with the following medications: -acetazolamide -alcohol -amitriptyline -aspirin and aspirin-like medicines -birth control pills -certain medicines for depression -certain medicines for seizures -certain medicines that treat or prevent blood clots like warfarin, enoxaparin, dalteparin, apixaban, dabigatran, and rivaroxaban -digoxin -hydrochlorothiazide -lithium -medicines for pain, sleep, or muscle relaxation -metformin -methazolamide -NSAIDS, medicines for pain and inflammation, like ibuprofen or naproxen -pioglitazone -risperidone This list may not describe all possible interactions. Give your health care provider a list of all the medicines,  herbs, non-prescription drugs, or dietary supplements you use. Also tell them if you smoke, drink alcohol, or use illegal drugs. Some items may interact with your medicine. What should I watch for while using this medicine? Visit your doctor or  health care professional for regular checks on your progress. Do not stop taking this medicine suddenly. This increases the risk of seizures if you are using this medicine to control epilepsy. Wear a medical identification bracelet or chain to say you have epilepsy or seizures, and carry a card that lists all your medicines. This medicine can decrease sweating and increase your body temperature. Watch for signs of deceased sweating or fever, especially in children. Avoid extreme heat, hot baths, and saunas. Be careful about exercising, especially in hot weather. Contact your health care provider right away if you notice a fever or decrease in sweating. You should drink plenty of fluids while taking this medicine. If you have had kidney stones in the past, this will help to reduce your chances of forming kidney stones. If you have stomach pain, with nausea or vomiting and yellowing of your eyes or skin, call your doctor immediately. You may get drowsy, dizzy, or have blurred vision. Do not drive, use machinery, or do anything that needs mental alertness until you know how this medicine affects you. To reduce dizziness, do not sit or stand up quickly, especially if you are an older patient. Alcohol can increase drowsiness and dizziness. Avoid alcoholic drinks. If you notice blurred vision, eye pain, or other eye problems, seek medical attention at once for an eye exam. The use of this medicine may increase the chance of suicidal thoughts or actions. Pay special attention to how you are responding while on this medicine. Any worsening of mood, or thoughts of suicide or dying should be reported to your health care professional right away. This medicine may increase the chance of developing metabolic acidosis. If left untreated, this can cause kidney stones, bone disease, or slowed growth in children. Symptoms include breathing fast, fatigue, loss of appetite, irregular heartbeat, or loss of consciousness. Call  your doctor immediately if you experience any of these side effects. Also, tell your doctor about any surgery you plan on having while taking this medicine since this may increase your risk for metabolic acidosis. Birth control pills may not work properly while you are taking this medicine. Talk to your doctor about using an extra method of birth control. Women who become pregnant while using this medicine may enroll in the [place] [employer] Antiepileptic Drug Pregnancy Registry by calling ([phone]. This registry collects information about the safety of antiepileptic drug use during pregnancy. What side effects may I notice from receiving this medicine? Side effects that you should report to your doctor or health care professional as soon as possible: -allergic reactions like skin rash, itching or hives, swelling of the face, lips, or tongue -decreased sweating and/or rise in body temperature -depression -difficulty breathing, fast or irregular breathing patterns -difficulty speaking -difficulty walking or controlling muscle movements -hearing impairment -redness, blistering, peeling or loosening of the skin, including inside the mouth -tingling, pain or numbness in the hands or feet -unusual bleeding or bruising -unusually weak or tired -worsening of mood, thoughts or actions of suicide or dying Side effects that usually do not require medical attention (report to your doctor or health care professional if they continue or are bothersome): -altered taste -back pain, joint or muscle aches and pains -diarrhea, or constipation -headache -loss of appetite -nausea -  stomach upset, indigestion -tremors This list may not describe all possible side effects. Call your doctor for medical advice about side effects. You may report side effects to FDA at 1-800-FDA-1088. Where should I keep my medicine? Keep out of the reach of children. Store at room temperature between 15 and 30 degrees C (59 and  86 degrees F) in a tightly closed container. Protect from moisture. Throw away any unused medicine after the expiration date. NOTE: This sheet is a summary. It may not cover all possible information. If you have questions about this medicine, talk to your doctor, pharmacist, or health care provider.    2016, [employer]. (2013-04-11 23:17:57)   Ondansetron oral dissolving tablet What is this medicine? ONDANSETRON (on DAN se tron) is used to treat nausea and vomiting caused by chemotherapy. It is also used to prevent or treat nausea and vomiting after surgery. This medicine may be used for other purposes; ask your health care provider or pharmacist if you have questions. What should I tell my health care provider before I take this medicine? They need to know if you have any of these conditions: -heart disease -history of irregular heartbeat -liver disease -low levels of magnesium or potassium in the blood -an unusual or allergic reaction to ondansetron, granisetron, other medicines, foods, dyes, or preservatives -pregnant or trying to get pregnant -breast-feeding How should I use this medicine? These tablets are made to dissolve in the mouth. Do not try to push the tablet through the foil backing. With dry hands, peel away the foil backing and gently remove the tablet. Place the tablet in the mouth and allow it to dissolve, then swallow. While you may take these tablets with water, it is not necessary to do so. Talk to your pediatrician regarding the use of this medicine in children. Special care may be needed. Overdosage: If you think you have taken too much of this medicine contact a poison control center or emergency room at once. NOTE: This medicine is only for you. Do not share this medicine with others. What if I miss a dose? If you miss a dose, take it as soon as you can. If it is almost time for your next dose, take only that dose. Do not take double or extra doses. What  may interact with this medicine? Do not take this medicine with any of the following medications: -apomorphine -certain medicines for fungal infections like fluconazole, itraconazole, ketoconazole, posaconazole, voriconazole -cisapride -dofetilide -dronedarone -pimozide -thioridazine -ziprasidone This medicine may also interact with the following medications: -carbamazepine -certain medicines for depression, anxiety, or psychotic disturbances -fentanyl -linezolid -MAOIs like Carbex, Eldepryl, Marplan, Nardil, and Parnate -methylene blue (injected into a vein) -other medicines that prolong the QT interval (cause an abnormal heart rhythm) -phenytoin -rifampicin -tramadol This list may not describe all possible interactions. Give your health care provider a list of all the medicines, herbs, non-prescription drugs, or dietary supplements you use. Also tell them if you smoke, drink alcohol, or use illegal drugs. Some items may interact with your medicine. What should I watch for while using this medicine? Check with your doctor or health care professional as soon as you can if you have any sign of an allergic reaction. What side effects may I notice from receiving this medicine? Side effects that you should report to your doctor or health care professional as soon as possible: -allergic reactions like skin rash, itching or hives, swelling of the face, lips, or tongue -breathing problems -confusion -dizziness -fast  or irregular heartbeat -feeling faint or lightheaded, falls -fever and chills -loss of balance or coordination -seizures -sweating -swelling of the hands and feet -tightness in the chest -tremors -unusually weak or tired Side effects that usually do not require medical attention (report to your doctor or health care professional if they continue or are bothersome): -constipation or diarrhea -headache This list may not describe all possible side effects. Call your  doctor for medical advice about side effects. You may report side effects to FDA at 1-800-FDA-1088. Where should I keep my medicine? Keep out of the reach of children. Store between 2 and 30 degrees C (36 and 86 degrees F). Throw away any unused medicine after the expiration date. NOTE: This sheet is a summary. It may not cover all possible information. If you have questions about this medicine, talk to your doctor, pharmacist, or health care provider.    2016, [employer]. (2013-01-12 16:21:52)   Tizanidine tablets or capsules What is this medicine? TIZANIDINE (tye ZAN i deen) helps to relieve muscle spasms. It may be used to help in the treatment of multiple sclerosis and spinal cord injury. This medicine may be used for other purposes; ask your health care provider or pharmacist if you have questions. What should I tell my health care provider before I take this medicine? They need to know if you have any of these conditions: -kidney disease -liver disease -low blood pressure -mental disorder -an unusual or allergic reaction to tizanidine, other medicines, lactose (tablets only), foods, dyes, or preservatives -pregnant or trying to get pregnant -breast-feeding How should I use this medicine? Take this medicine by mouth with a full glass of water. Take this medicine on an empty stomach, at least 30 minutes before or 2 hours after food. Do not take with food unless you talk with your doctor. Follow the directions on the prescription label. Take your medicine at regular intervals. Do not take your medicine more often than directed. Do not stop taking except on your doctor's advice. Suddenly stopping the medicine can be very dangerous. Talk to your pediatrician regarding the use of this medicine in children. Patients over 58 years old may have a stronger reaction and need a smaller dose. Overdosage: If you think you have taken too much of this medicine contact a poison control  center or emergency room at once. NOTE: This medicine is only for you. Do not share this medicine with others. What if I miss a dose? If you miss a dose, take it as soon as you can. If it is almost time for your next dose, take only that dose. Do not take double or extra doses. What may interact with this medicine? Do not take this medicine with any of the following medications: -ciprofloxacin -clonidine -fluvoxamine -guanabenz -guanfacine -methyldopa This medicine may also interact with the following medications: -acyclovir -alcohol -antihistamines -baclofen -barbiturates like phenobarbital -benzodiazepines -cimetidine -famotidine -female hormones, like estrogens or progestins and birth control pills -medicines for high blood pressure -medicines for irregular heartbeat -medicines for pain like codeine, morphine, and hydrocodone -medicines for sleep -rofecoxib -some antibiotics like levofloxacin, ofloxacin -ticlopidine -zileuton This list may not describe all possible interactions. Give your health care provider a list of all the medicines, herbs, non-prescription drugs, or dietary supplements you use. Also tell them if you smoke, drink alcohol, or use illegal drugs. Some items may interact with your medicine. What should I watch for while using this medicine? You may get drowsy or dizzy. Do not drive,  use machinery, or do anything that needs mental alertness until you know how this medicine affects you. Do not stand or sit up quickly, especially if you are an older patient. This reduces the risk of dizzy or fainting spells. Alcohol may interfere with the effect of this medicine. Avoid alcoholic drinks. Your mouth may get dry. Chewing sugarless gum or sucking hard candy, and drinking plenty of water may help. Contact your doctor if the problem does not go away or is severe. What side effects may I notice from receiving this medicine? Side effects that you should report to your  doctor or health care professional as soon as possible: -allergic reactions like skin rash, itching or hives, swelling of the face, lips, or tongue -blurred vision -fainting spells -hallucinations -nausea or vomiting -nervousness -redness, blistering, peeling or loosening of the skin, including inside the mouth -slow or irregular heartbeat, palpitations, or chest pain -yellowing of the skin or eyes Side effects that usually do not require medical attention (report to your doctor or health care professional if they continue or are bothersome): -dizziness -drowsiness -dry mouth -tiredness or weakness This list may not describe all possible side effects. Call your doctor for medical advice about side effects. You may report side effects to FDA at 1-800-FDA-1088. Where should I keep my medicine? Keep out of the reach of children. Store at room temperature between 15 and 30 degrees C (59 and 86 degrees F). Throw away any unused medicine after the expiration date. NOTE: This sheet is a summary. It may not cover all possible information. If you have questions about this medicine, talk to your doctor, pharmacist, or health care provider.    2016, [employer]. (2007-12-23 12:38:02)

## 2015-12-03 NOTE — Progress Notes (Signed)
[facility]    Provider:  Dr Jaynee Eagles Referring Provider: Emeterio Reeve, DO Primary Care Physician:  Emeterio Reeve, DO  CC:  Migraines, numbness, facial droop.  HPI:  Rhea Kaelin is a 30 y.o. female here as a referral from Dr. Sheppard Coil for migraines. Past medical history of hypertension, high cholesterol, migraine, depression, anxiety, hypothyroidism, hyperlipidemia, nonalcoholic fatty liver, Turner's syndrome, asthma, rickets.Started at the age of 2. Mother is here and provides information as well. Mother has migraines. She has been to the emergency room multiple times. No inciting events, they just started at the age of 59, no head trauma. She has had a migraine last up to 2 weeks and went to the ED recently. Slowly worsening, progressive increase in frequency and severity. Only sleep and dark rooms help, recently naratriptan is helping her insurance will pay for it and she now has Imitrex. She has to wear dark glasses at the ofice. Recently she had left arm numbness in association with migraine, numbness, couldn't feel her arm and left facial numbness. She had facial droop with the headache (showed me a picture exhibiting left facial lower weakness). She has been to the ED and it has happened several times in the last several months. She often does not have an aura but also has an aura, loses vision, sparkles, shimmering.  Her right side has also been numb. Headaches are on the right side, pounding, throbbing, and behind the eye. She has 20 headache days out of the month and at least 1/2 are migrainous for the last 6 months and usually last all day if not longer. She has sensitivity to smells, nausea and vomiting. She also experienced confusion and memory changes during these episodes. No other focal neurologic deficits or complaints. Mother has migraines.  Reviewed notes, labs and imaging from outside physicians, which showed:   Patient has a history of migraine.  Medications tried include: Reglan, Benadryl, Toradol.  BUN 11, creatinine 0.75 these labs drawn August 2017.   Personally reviewed MRI/MRA of the brain and agree with the following: IMPRESSION: 1. Normal MRI of the brain. 2. Normal variant MRA circle of Willis without significant proximal stenosis, aneurysm, or branch vessel occlusion.  CBC normal, CMP with elevated AST and ALT otherwise normal both drawn 11/28/2015.  Review of Systems: Patient complains of symptoms per HPI as well as the following symptoms: Fatigue, blurred vision, eye pain, flushing, ringing in ears, spinning sensation, allergies, confusion, headache, numbness, weakness, slurred speech, dizziness, sleepiness, restless legs, depression, anxiety, not enough sleep, decreased energy. Pertinent negatives per HPI. All others negative.   Social History   Social History  . Marital status: Single    Spouse name: N/A  . Number of children: N/A  . Years of education: N/A   Occupational History  . Not on file.   Social History Main Topics  . Smoking status: Never Smoker  . Smokeless tobacco: Never Used  . Alcohol use Yes     Comment: socially  . Drug use: No  . Sexual activity: Not on file   Other Topics Concern  . Not on file   Social History Narrative   Lives   Caffeine use:     Family History  Problem Relation Age of Onset  . Hypertension Mother   . Arthritis Mother   . Fibromyalgia Mother   . Hyperlipidemia Mother   . Diabetes Father   . Diabetes Paternal Grandmother   . Stroke Paternal Grandmother   . Kidney disease Paternal Grandmother   .  Heart attack Paternal Grandmother   . Hypertension Paternal Grandmother   . Hyperlipidemia Paternal Grandmother   . Diabetes Paternal Grandfather   . Alcohol abuse Maternal Grandfather   . Cancer Maternal Grandfather     BRAIN    Past Medical History:  Diagnosis Date  . Allergy   . Anxiety   . Hematuria   . Hip dysplasia   . Hyperlipidemia   .  Hypertension   . Migraine   . NASH (nonalcoholic steatohepatitis)   . Osteoporosis   . Scoliosis    minimal  . Thyroid disease   . Turner syndrome   . Vitamin D deficiency     Past Surgical History:  Procedure Laterality Date  . BILATERAL OOPHORECTOMY    . HERNIA REPAIR     x2  . HIP SURGERY    . TONSILLECTOMY    . TYMPANOSTOMY TUBE PLACEMENT      Current Outpatient Prescriptions  Medication Sig Dispense Refill  . albuterol (PROVENTIL HFA;VENTOLIN HFA) 108 (90 Base) MCG/ACT inhaler Inhale 2 puffs into the lungs every 6 (six) hours as needed for wheezing. 1 Inhaler 0  . amLODipine (NORVASC) 5 MG tablet Take 1 tablet (5 mg total) by mouth daily. 90 tablet 1  . escitalopram (LEXAPRO) 10 MG tablet Take 1 tablet (10 mg total) by mouth daily. 90 tablet 1  . estrogens, conjugated, (PREMARIN) 1.25 MG tablet Take 1 tablet (1.25 mg total) by mouth daily. 90 tablet 1  . fexofenadine (ALLEGRA) 60 MG tablet Take 60 mg by mouth daily.      . fluticasone (FLONASE) 50 MCG/ACT nasal spray Place two sprays in each nostril once daily 16 g 1  . Fluticasone-Salmeterol (ADVAIR DISKUS) 100-50 MCG/DOSE AEPB Inhale 1 puff into the lungs 2 (two) times daily. Inhale 1 puff into lungs bid 60 each 3  . levothyroxine (SYNTHROID, LEVOTHROID) 100 MCG tablet Take 1 tablet (100 mcg total) by mouth daily before breakfast. 90 tablet 1  . medroxyPROGESTERone (PROVERA) 10 MG tablet Take 1 tablet (10 mg total) by mouth daily. 10 days per month 30 tablet 1  . Multiple Vitamin (MULTIVITAMIN) tablet Take 1 tablet by mouth daily.      . naratriptan (AMERGE) 2.5 MG tablet Take 1 tablet (2.5 mg total) by mouth as needed (migraine onset, can repeat dose in 4 hr if need, MAX 2 tablet/24 hr). 12 tablet 2  . SUMAtriptan Succinate (IMITREX PO) Take 1 tablet by mouth as needed.    . ondansetron (ZOFRAN ODT) 4 MG disintegrating tablet Take 1-2 tablets by mouth every 8 (eight) hours as needed for nausea or vomiting. 30 tablet 12  .  tiZANidine (ZANAFLEX) 4 MG tablet Take 1 tablet (4 mg total) by mouth 3 (three) times daily as needed for headache. 60 tablet 1  . topiramate (TOPAMAX) 25 MG tablet Take 4 tablets (100 mg total) by mouth at bedtime. Start with one pill at night and increase by one pill each week until taking 4. 120 tablet 12   No current facility-administered medications for this visit.     Allergies as of 12/03/2015 - Review Complete 12/03/2015  Allergen Reaction Noted  . Prednisone  07/22/2012    Vitals: BP 119/82 (BP Location: Right Arm, Patient Position: Sitting, Cuff Size: Large)   Pulse (!) 113   Ht 5' 2"  (1.575 m)   Wt 174 lb (78.9 kg)   BMI 31.83 kg/m  Last Weight:  Wt Readings from Last 1 Encounters:  12/03/15 174 lb (78.9  kg)   Last Height:   Ht Readings from Last 1 Encounters:  12/03/15 5' 2"  (1.575 m)    Physical exam: Exam: Gen: NAD, conversant, well nourised, obese, well groomed                     CV: RRR, no MRG. No Carotid Bruits. No peripheral edema, warm, nontender Eyes: Conjunctivae clear without exudates or hemorrhage  Neuro: Detailed Neurologic Exam  Speech:    Speech is normal; fluent and spontaneous with normal comprehension.  Cognition:    The patient is oriented to person, place, and time;     recent and remote memory intact;     language fluent;     normal attention, concentration,     fund of knowledge Cranial Nerves:    The pupils are equal, round, and reactive to light. The fundi are normal and spontaneous venous pulsations are present. Visual fields are full to finger confrontation. Extraocular movements are intact. Trigeminal sensation is intact and the muscles of mastication are normal. The face is symmetric. The palate elevates in the midline. Hearing intact. Voice is normal. Shoulder shrug is normal. The tongue has normal motion without fasciculations.   Coordination:    Normal finger to nose and heel to shin. Normal rapid alternating movements.    Gait:    Heel-toe and tandem gait are normal.   Motor Observation:    No asymmetry, no atrophy, and no involuntary movements noted. Tone:    Normal muscle tone.    Posture:    Posture is normal. normal erect    Strength:    Strength is V/V in the upper and lower limbs.      Sensation: intact to LT     Reflex Exam:  DTR's:    Deep tendon reflexes in the upper and lower extremities are normal bilaterally.   Toes:    The toes are downgoing bilaterally.   Clonus:    Clonus is absent.      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  As far as your medications are concerned, I would like to suggest: Start Topiramate at night 1 pill. Every week increase by one pill until taking 4 pills at night. At onset of migraine take imitrex and zofran. May repeat in 2 hours once Tizanidine up to 3x a day for headache. Stop daily OTC medications. We'll provide a steroid infusion today with Compazine and Toradol due to her ongoing headache.  Discussed side effect of medications as per patient instructions. Discussed teratogenicity of medications.  To prevent or relieve headaches, try the following: Cool Compress. Lie down and place a cool compress on your head.  Avoid headache triggers. If certain foods or odors seem to have triggered your migraines in the past, avoid them. A headache diary might help you identify triggers.  Include physical activity in your daily routine. Try a daily walk or other moderate aerobic exercise.  Manage stress. Find healthy ways to cope with the stressors, such as delegating tasks on your to-do list.  Practice relaxation techniques. Try deep breathing, yoga, massage and visualization.  Eat regularly. Eating regularly scheduled meals and maintaining a healthy diet might help prevent headaches. Also, drink plenty of fluids.  Follow a regular sleep schedule. Sleep deprivation  might contribute to headaches Consider biofeedback. With this mind-body technique, you learn to control certain bodily functions - such as muscle tension, heart rate and blood  pressure - to prevent headaches or reduce headache pain.    Proceed to emergency room if you experience new or worsening symptoms or symptoms do not resolve, if you have new neurologic symptoms or if headache is severe, or for any concerning symptom.  Cc: Dr. Vinetta Bergamo, MD  [facility] [address] [place], [place] [address]  Phone [phone] Fax [phone]

## 2015-12-07 NOTE — Telephone Encounter (Signed)
Form faxed  To initiate PA for premarin 1.25 mg tablet

## 2015-12-10 NOTE — Telephone Encounter (Signed)
Premarin has been approved through insurance. Pt's mailbox is full.

## 2016-01-21 ENCOUNTER — Telehealth: Payer: Self-pay | Admitting: *Deleted

## 2016-01-21 NOTE — Telephone Encounter (Signed)
Called and spoke to patients mother Marcie Bal. (on DPR) Unable to reach pt on mobile and work number.   Advised I need to verify how patient is taking topiramate, Should be taking 127m at night. She is going to have her call back to verify, Advised we received refill request for 90 day supply.

## 2016-01-23 ENCOUNTER — Other Ambulatory Visit: Payer: Self-pay | Admitting: Neurology

## 2016-01-23 MED ORDER — TOPIRAMATE 100 MG PO TABS: 100.0000 mg | ORAL_TABLET | Freq: Every day | ORAL | 12 refills | Status: DC

## 2016-01-23 NOTE — Telephone Encounter (Signed)
Called pt again since no return call. She verified she is taking 100mg  at night. She states medication is helping.   She is going to file [facility] [employer] [facility] work for migraines. Advised she will have to sign release form for us to be able to fax back. There is a 50 dollar charge and Dr Lucia Gaskinsahern has 14 days to complete. She verbalized understanding.   Spoke to Dr Lucia Gaskinsahern. She put in new rx for topiramate 100mg  at night.  Please send to:  [id number] - [place], [place] - [address] [phone] (Phone) [phone] (Fax)

## 2016-01-24 NOTE — Telephone Encounter (Signed)
Done. thanks

## 2016-02-26 ENCOUNTER — Ambulatory Visit: Payer: 59 | Admitting: Osteopathic Medicine

## 2016-03-05 ENCOUNTER — Ambulatory Visit: Payer: 59 | Admitting: Neurology

## 2016-03-06 ENCOUNTER — Encounter: Payer: Self-pay | Admitting: Neurology

## 2016-03-20 ENCOUNTER — Other Ambulatory Visit: Payer: Self-pay | Admitting: Osteopathic Medicine

## 2016-03-20 DIAGNOSIS — I10 Essential (primary) hypertension: Secondary | ICD-10-CM

## 2016-03-20 DIAGNOSIS — F4323 Adjustment disorder with mixed anxiety and depressed mood: Secondary | ICD-10-CM

## 2016-03-21 ENCOUNTER — Other Ambulatory Visit: Payer: Self-pay | Admitting: Osteopathic Medicine

## 2016-04-07 ENCOUNTER — Other Ambulatory Visit: Payer: Self-pay | Admitting: *Deleted

## 2016-04-07 MED ORDER — TOPIRAMATE 100 MG PO TABS: 100.0000 mg | ORAL_TABLET | Freq: Every day | ORAL | 0 refills | Status: DC

## 2016-04-11 ENCOUNTER — Other Ambulatory Visit: Payer: Self-pay

## 2016-04-11 DIAGNOSIS — F4323 Adjustment disorder with mixed anxiety and depressed mood: Secondary | ICD-10-CM

## 2016-04-11 DIAGNOSIS — I10 Essential (primary) hypertension: Secondary | ICD-10-CM

## 2016-04-11 MED ORDER — AMLODIPINE BESYLATE 5 MG PO TABS: 5.0000 mg | ORAL_TABLET | Freq: Every day | ORAL | 0 refills | Status: DC

## 2016-04-11 MED ORDER — ESCITALOPRAM OXALATE 10 MG PO TABS: 10.0000 mg | ORAL_TABLET | Freq: Every day | ORAL | 0 refills | Status: DC

## 2016-04-11 NOTE — Telephone Encounter (Signed)
Refill request for Amlodipine 5 mg and Escitalopram 41m. # 15 was given patient is due for follow up appointment and she was advised on her last visit. Rhonda Cunningham,CMA

## 2016-04-28 ENCOUNTER — Other Ambulatory Visit: Payer: Self-pay | Admitting: Neurology

## 2016-04-28 DIAGNOSIS — G43101 Migraine with aura, not intractable, with status migrainosus: Secondary | ICD-10-CM

## 2016-04-29 ENCOUNTER — Emergency Department (INDEPENDENT_AMBULATORY_CARE_PROVIDER_SITE_OTHER)
Admission: EM | Admit: 2016-04-29 | Discharge: 2016-04-29 | Disposition: A | Discharge status: Home / Self Care | Payer: 59 | Source: Home / Self Care | Attending: Family Medicine | Admitting: Family Medicine

## 2016-04-29 ENCOUNTER — Encounter: Payer: Self-pay | Admitting: *Deleted

## 2016-04-29 DIAGNOSIS — R112 Nausea with vomiting, unspecified: Secondary | ICD-10-CM

## 2016-04-29 DIAGNOSIS — R1013 Epigastric pain: Secondary | ICD-10-CM

## 2016-04-29 DIAGNOSIS — R195 Other fecal abnormalities: Secondary | ICD-10-CM

## 2016-04-29 MED ORDER — ONDANSETRON 4 MG PO TBDP
4.0000 mg | ORAL_TABLET | Freq: Once | ORAL | Status: AC
  Administered 2016-04-29: 4 mg via ORAL

## 2016-04-29 NOTE — ED Triage Notes (Signed)
Pt c/o nausea, vomiting, chills,  Temp 100-101, loose stool, and body aches x 1 day. No vomiting since last night. Took IBF last night.

## 2016-04-29 NOTE — ED Provider Notes (Signed)
CSN: [id number]     Arrival date & time 04/29/16  1230 History   First MD Initiated Contact with Patient 04/29/16 1252     Chief Complaint  Patient presents with  . Emesis   (Consider location/radiation/quality/duration/timing/severity/associated sxs/prior Treatment) HPI  Cristina Zimmerman is a 31 y.o. female presenting to UC with c/o nausea, vomiting x2, chills, body aches, and fever of 100-101*F with loose stools since yesterday.  No vomiting since last night.  She did take zofran and ibuprofen last night with moderate relief.  She reports hx of migraines and chronic mild nausea but was concerned about the fever and body aches. She did not get the flu vaccine this year. No known sick contacts. No recent travel.  Denies cough, congestion or sore throat.    Past Medical History:  Diagnosis Date  . Allergy   . Anxiety   . Hematuria   . Hip dysplasia   . Hyperlipidemia   . Hypertension   . Migraine   . NASH (nonalcoholic steatohepatitis)   . Osteoporosis   . Scoliosis    minimal  . Thyroid disease   . Turner syndrome   . Vitamin D deficiency    Past Surgical History:  Procedure Laterality Date  . BILATERAL OOPHORECTOMY    . HERNIA REPAIR     x2  . HIP SURGERY    . TONSILLECTOMY    . TYMPANOSTOMY TUBE PLACEMENT     Family History  Problem Relation Age of Onset  . Hypertension Mother   . Arthritis Mother   . Fibromyalgia Mother   . Hyperlipidemia Mother   . Diabetes Father   . Diabetes Paternal Grandmother   . Stroke Paternal Grandmother   . Kidney disease Paternal Grandmother   . Heart attack Paternal Grandmother   . Hypertension Paternal Grandmother   . Hyperlipidemia Paternal Grandmother   . Diabetes Paternal Grandfather   . Alcohol abuse Maternal Grandfather   . Cancer Maternal Grandfather     BRAIN   Social History  Substance Use Topics  . Smoking status: Never Smoker  . Smokeless tobacco: Never Used  . Alcohol use Yes     Comment: socially   OB History     No data available     Review of Systems  Constitutional: Positive for chills, fatigue and fever.  HENT: Negative for congestion, ear pain, sore throat, trouble swallowing and voice change.   Respiratory: Negative for cough and shortness of breath.   Cardiovascular: Negative for chest pain and palpitations.  Gastrointestinal: Positive for diarrhea ( loose stool), nausea and vomiting. Negative for abdominal pain and blood in stool.  Genitourinary: Negative for dysuria, frequency and urgency.  Musculoskeletal: Positive for arthralgias and myalgias. Negative for back pain.       Body aches  Skin: Negative for rash.    Allergies  Prednisone  Home Medications   Prior to Admission medications   Medication Sig Start Date End Date Taking? Authorizing Provider  albuterol (PROVENTIL HFA;VENTOLIN HFA) 108 (90 Base) MCG/ACT inhaler Inhale 2 puffs into the lungs every 6 (six) hours as needed for wheezing. 05/24/15   Junius FinnerErin O'Malley, PA-C  amLODipine (NORVASC) 5 MG tablet Take 1 tablet (5 mg total) by mouth daily. NEED FOLLOW UP APPOINTMENT FOR MORE REFILLS 04/11/16   Sunnie NielsenNatalie Alexander, DO  escitalopram (LEXAPRO) 10 MG tablet Take 1 tablet (10 mg total) by mouth daily. NEED FOLLOW UP APPOINTMENT FOR MORE REFILLS 04/11/16   Sunnie NielsenNatalie Alexander, DO  estrogens, conjugated, (PREMARIN) 1.25 MG  tablet Take 1 tablet (1.25 mg total) by mouth daily. 09/13/15   Sunnie Nielsen, DO  fexofenadine (ALLEGRA) 60 MG tablet Take 60 mg by mouth daily.      Historical Provider, MD  fluticasone Aleda Grana) 50 MCG/ACT nasal spray Place two sprays in each nostril once daily 05/24/15   Junius Finner, PA-C  Fluticasone-Salmeterol (ADVAIR DISKUS) 100-50 MCG/DOSE AEPB Inhale 1 puff into the lungs 2 (two) times daily. Inhale 1 puff into lungs bid 09/13/15   Sunnie Nielsen, DO  levothyroxine (SYNTHROID, LEVOTHROID) 100 MCG tablet Take 1 tablet (100 mcg total) by mouth daily before breakfast. 09/13/15   Sunnie Nielsen, DO   medroxyPROGESTERone (PROVERA) 10 MG tablet Take 1 tablet (10 mg total) by mouth daily. 10 days per month 09/13/15   Sunnie Nielsen, DO  Multiple Vitamin (MULTIVITAMIN) tablet Take 1 tablet by mouth daily.      Historical Provider, MD  naratriptan (AMERGE) 2.5 MG tablet TAKE 1 TABLET AS NEEDED MIGRAINE MAY REPEAT AFTER 4 HOURS MAX 2 PER 24 HOURS 03/21/16   Sunnie Nielsen, DO  ondansetron (ZOFRAN ODT) 4 MG disintegrating tablet Take 1-2 tablets by mouth every 8 (eight) hours as needed for nausea or vomiting. 12/03/15   Anson Fret, MD  SUMAtriptan Succinate (IMITREX PO) Take 1 tablet by mouth as needed.    Historical Provider, MD  tiZANidine (ZANAFLEX) 4 MG tablet Take 1 tablet (4 mg total) by mouth 3 (three) times daily as needed for headache. 12/03/15   Anson Fret, MD  topiramate (TOPAMAX) 100 MG tablet Take 1 tablet (100 mg total) by mouth at bedtime. 04/07/16   Anson Fret, MD   Meds Ordered and Administered this Visit   Medications  ondansetron (ZOFRAN-ODT) disintegrating tablet 4 mg (4 mg Oral Given 04/29/16 1309)    BP 107/75 (BP Location: Left Arm)   Pulse 93   Temp 98.6 F (37 C) (Oral)   Resp 16   Ht 5\' 2"  (1.575 m)   Wt 175 lb (79.4 kg)   SpO2 96%   BMI 32.01 kg/m  No data found.   Physical Exam  Constitutional: She is oriented to person, place, and time. She appears well-developed and well-nourished. No distress.  HENT:  Head: Normocephalic and atraumatic.  Right Ear: Tympanic membrane normal.  Left Ear: Tympanic membrane normal.  Nose: Nose normal.  Mouth/Throat: Uvula is midline, oropharynx is clear and moist and mucous membranes are normal.  Eyes: EOM are normal.  Neck: Normal range of motion. Neck supple.  Cardiovascular: Normal rate and regular rhythm.   Pulmonary/Chest: Effort normal and breath sounds normal. No stridor. No respiratory distress. She has no wheezes. She has no rales.  Abdominal: Soft. She exhibits no distension and no mass. There  is tenderness ( epigastric, mild). There is no rebound and no guarding.  Musculoskeletal: Normal range of motion.  Lymphadenopathy:    She has no cervical adenopathy.  Neurological: She is alert and oriented to person, place, and time.  Skin: Skin is warm and dry. She is not diaphoretic.  Psychiatric: She has a normal mood and affect. Her behavior is normal.  Nursing note and vitals reviewed.   Urgent Care Course   Clinical Course     Procedures (including critical care time)  Labs Review Labs Reviewed - No data to display  Imaging Review No results found.    MDM   1. Non-intractable vomiting with nausea, unspecified vomiting type   2. Loose stools   3. Epigastric pain  Pt c/o n/v/d and mild epigastric pain. No URI symptoms. Pt appears well hydrated. NAD. Exam not concerning for surgical abdomen.  Symptoms likely viral in nature. Encouraged good hydration and rest. Pt notes she has plenty of zofran at home. Work note provided for tomorrow. F/u with PCP In 3-4 days if not improving.     Junius Finner, PA-C 04/29/16 1436

## 2016-04-30 ENCOUNTER — Ambulatory Visit: Payer: 59 | Admitting: Osteopathic Medicine

## 2016-05-19 ENCOUNTER — Telehealth: Payer: Self-pay | Admitting: Neurology

## 2016-05-19 NOTE — Telephone Encounter (Signed)
Patient called stating she feels her  Depression has gotten a lot worse and feels its coming from Topamax medication.  Please call

## 2016-05-19 NOTE — Telephone Encounter (Signed)
Called and spoke to patient. She has had migraines since she was [age] years old. Last year they got a lot worse. She had a lot more complicated migraines towards end of last year. That is when she ended up seeing Dr Lucia GaskinsAhern. Topiramate was her first preventative medication. She knew there was a possibility of side effects. She was trying to see if she could tolerate side effects better as time went on. She missed a follow-up with Dr Lucia GaskinsAhern and then stated she got busy with the holidays. She was having a hard time and is very depressed. She has been having to stay with her parents. She normally lives on her own. They did not feel like she was ok with being on her own. She thought depression was improving, but it has not improved. She has been having a really hard time per patient. Having a hard time sleeping, getting up in the morning.   Has not started any other new medications. Same time she started topiramate, she was started on tizanidine by AA,MD. She ran out of this medication.   She does have history of depression, but not to this extent. She takes lexapro daily which has helped in the past and kept her stable. Since starting topiramate, her depression has become severe. She has had some major life changes that has been stressful, but patient did not specify.  Made f/u appt with AA,MD tomorrow at 230pm, check in 200pm. PT agreeable to this.

## 2016-05-20 ENCOUNTER — Ambulatory Visit (INDEPENDENT_AMBULATORY_CARE_PROVIDER_SITE_OTHER): Payer: 59 | Admitting: Neurology

## 2016-05-20 ENCOUNTER — Encounter: Payer: Self-pay | Admitting: Neurology

## 2016-05-20 VITALS — BP 138/88 | HR 89 | Ht 62.0 in | Wt 175.6 lb

## 2016-05-20 DIAGNOSIS — M542 Cervicalgia: Secondary | ICD-10-CM

## 2016-05-20 DIAGNOSIS — G43009 Migraine without aura, not intractable, without status migrainosus: Secondary | ICD-10-CM

## 2016-05-20 MED ORDER — NORTRIPTYLINE HCL 10 MG PO CAPS: 20.0000 mg | ORAL_CAPSULE | Freq: Every day | ORAL | 6 refills | Status: DC

## 2016-05-20 MED ORDER — PROMETHAZINE HCL 12.5 MG PO TABS: 12.5000 mg | ORAL_TABLET | Freq: Four times a day (QID) | ORAL | 6 refills | Status: DC | PRN

## 2016-05-20 NOTE — Patient Instructions (Signed)
Remember to drink plenty of fluid, eat healthy meals and do not skip any meals. Try to eat protein with a every meal and eat a healthy snack such as fruit or nuts in between meals. Try to keep a regular sleep-wake schedule and try to exercise daily, particularly in the form of walking, 20-30 minutes a day, if you can.   As far as your medications are concerned, I would like to suggest: Start Nortriptyline 42m at night and in 1 weeks if no side effects increase to 2 pills at night. Email me in a few weeks and let me know how things are going. Stop Topiramate.  I would like to see you back in 3 months, sooner if we need to. Please call [facility] any interim questions, concerns, problems, updates or refill requests.   Our phone number is [phone] We also have an after hours call service for urgent matters and there is a physician on-call for urgent questions. For any emergencies you know to call 911 or go to the nearest emergency room  Nortriptyline capsules What is this medicine? NORTRIPTYLINE (nor TRIP ti leen) is used to treat depression and migraines, also used for insomnia This medicine may be used for other purposes; ask your health care provider or pharmacist if you have questions. COMMON BRAND NAME(S): Aventyl, Pamelor What should I tell my health care provider before I take this medicine? They need to know if you have any of these conditions: -an alcohol problem -bipolar disorder or schizophrenia -difficulty passing urine, prostate trouble -glaucoma -heart disease or recent heart attack -liver disease -over active thyroid -seizures -thoughts or plans of suicide or a previous suicide attempt or family history of suicide attempt -an unusual or allergic reaction to nortriptyline, other medicines, foods, dyes, or preservatives -pregnant or trying to get pregnant -breast-feeding How should I use this medicine? Take this medicine by mouth with a glass of water. Follow the directions  on the prescription label. Take your doses at regular intervals. Do not take it more often than directed. Do not stop taking this medicine suddenly except upon the advice of your doctor. Stopping this medicine too quickly may cause serious side effects or your condition may worsen. A special MedGuide will be given to you by the pharmacist with each prescription and refill. Be sure to read this information carefully each time. Talk to your pediatrician regarding the use of this medicine in children. Special care may be needed. Overdosage: If you think you have taken too much of this medicine contact a poison control center or emergency room at once. NOTE: This medicine is only for you. Do not share this medicine with others. What if I miss a dose? If you miss a dose, take it as soon as you can. If it is almost time for your next dose, take only that dose. Do not take double or extra doses. What may interact with this medicine? Do not take this medicine with any of the following medications: -arsenic trioxide -certain medicines medicines for irregular heart beat -cisapride -halofantrine -linezolid -MAOIs like Carbex, Eldepryl, Marplan, Nardil, and Parnate -methylene blue (injected into a vein) -other medicines for mental depression -phenothiazines like perphenazine, thioridazine and chlorpromazine -pimozide -probucol -procarbazine -sparfloxacin -St. John's Wort -ziprasidone This medicine may also interact with any of the following medications: -atropine and related drugs like hyoscyamine, scopolamine, tolterodine and others -barbiturate medicines for inducing sleep or treating seizures, such as phenobarbital -cimetidine -medicines for diabetes -medicines for seizures like carbamazepine or  phenytoin -reserpine -thyroid medicine This list may not describe all possible interactions. Give your health care provider a list of all the medicines, herbs, non-prescription drugs, or dietary  supplements you use. Also tell them if you smoke, drink alcohol, or use illegal drugs. Some items may interact with your medicine. What should I watch for while using this medicine? Tell your doctor if your symptoms do not get better or if they get worse. Visit your doctor or health care professional for regular checks on your progress. Because it may take several weeks to see the full effects of this medicine, it is important to continue your treatment as prescribed by your doctor. Patients and their families should watch out for new or worsening thoughts of suicide or depression. Also watch out for sudden changes in feelings such as feeling anxious, agitated, panicky, irritable, hostile, aggressive, impulsive, severely restless, overly excited and hyperactive, or not being able to sleep. If this happens, especially at the beginning of treatment or after a change in dose, call your health care professional. Dennis Bast may get drowsy or dizzy. Do not drive, use machinery, or do anything that needs mental alertness until you know how this medicine affects you. Do not stand or sit up quickly, especially if you are an older patient. This reduces the risk of dizzy or fainting spells. Alcohol may interfere with the effect of this medicine. Avoid alcoholic drinks. Do not treat yourself for coughs, colds, or allergies without asking your doctor or health care professional for advice. Some ingredients can increase possible side effects. Your mouth may get dry. Chewing sugarless gum or sucking hard candy, and drinking plenty of water may help. Contact your doctor if the problem does not go away or is severe. This medicine may cause dry eyes and blurred vision. If you wear contact lenses you may feel some discomfort. Lubricating drops may help. See your eye doctor if the problem does not go away or is severe. This medicine can cause constipation. Try to have a bowel movement at least every 2 to 3 days. If you do not have a  bowel movement for 3 days, call your doctor or health care professional. This medicine can make you more sensitive to the sun. Keep out of the sun. If you cannot avoid being in the sun, wear protective clothing and use sunscreen. Do not use sun lamps or tanning beds/booths. What side effects may I notice from receiving this medicine? Side effects that you should report to your doctor or health care professional as soon as possible: -allergic reactions like skin rash, itching or hives, swelling of the face, lips, or tongue -anxious -breathing problems -changes in vision -confusion -elevated mood, decreased need for sleep, racing thoughts, impulsive behavior -eye pain -fast, irregular heartbeat -feeling faint or lightheaded, falls -feeling agitated, angry, or irritable -fever with increased sweating -hallucination, loss of contact with reality -seizures -stiff muscles -suicidal thoughts or other mood changes -tingling, pain, or numbness in the feet or hands -trouble passing urine or change in the amount of urine -trouble sleeping -unusually weak or tired -vomiting -yellowing of the eyes or skin Side effects that usually do not require medical attention (report to your doctor or health care professional if they continue or are bothersome): -change in sex drive or performance -change in appetite or weight -constipation -dizziness -dry mouth -nausea -tired -tremors -upset stomach This list may not describe all possible side effects. Call your doctor for medical advice about side effects. You may report side  effects to FDA at 1-800-FDA-1088. Where should I keep my medicine? Keep out of the reach of children. Store at room temperature between 15 and 30 degrees C (59 and 86 degrees F). Keep container tightly closed. Throw away any unused medicine after the expiration date. NOTE: This sheet is a summary. It may not cover all possible information. If you have questions about this  medicine, talk to your doctor, pharmacist, or health care provider.  2017 Elsevier/Gold Standard (2015-09-07 12:53:08)

## 2016-05-20 NOTE — Progress Notes (Signed)
[facility]    Provider:  Dr Jaynee Eagles Referring Provider: Emeterio Reeve, DO Primary Care Physician:  Emeterio Reeve, DO   CC:  Migraines, numbness, facial droop.  Interval history: Topiramate helped but has worsened depression. Other medications that can be used that will not impact her depression would be depakote, SNRIs (cymbalta, effexor), TCAs, Gabapentin. She states considerable improvement on the topamax. This is why she hasn't wanted to start it. Patient's mother is here provides much information as is the patient has been extremely depressed since starting Topamax. Patient also reports hair loss. Denies any thoughts of hurting herself. Topamax can cause worsening depression. Today we discussed migraine management including all the different classes of medications that we can use. Epilepsy medications, Depakote is a medication that could be used in this particular case, patient has Turner syndrome and infertility still discussed teratogenicity Depakote. Anti-epilepsy medications may cause bone loss and patient has osteoporosis and she worries about this. I blood pressure medications such as propranolol can cause worsening depression. Could try Cymbalta and Effexor but she is already on Lexapro which I don't want to change and putting her on both risks serotonin syndrome. Tricyclic antidepressant is something that we discussed today, it also increases serotonin but less likely to cause serotonin syndrome. EKG performed in the office showed QTC of 409 which is normal. Discussed serotonin syndrome. Will add nortriptyline which will also help with her insomnia. Had a long discussion about migraine medication, migraine pathophysiology. She also has a lot of neck pain, neck tightness, this can worsen her headaches are cause her headaches.  HPI:  Cristina Zimmerman is a 31 y.o. female here as a referral from Dr. Sheppard Coil for migraines. Past medical history of hypertension, high  cholesterol, migraine, depression, anxiety, hypothyroidism, hyperlipidemia, nonalcoholic fatty liver, Turner's syndrome, asthma, rickets.Started at the age of 32. Mother is here and provides information as well. Mother has migraines. She has been to the emergency room multiple times. No inciting events, they just started at the age of 70, no head trauma. She has had a migraine last up to 2 weeks and went to the ED recently. Slowly worsening, progressive increase in frequency and severity. Only sleep and dark rooms help, recently naratriptan is helping her insurance will pay for it and she now has Imitrex. She has to wear dark glasses at the ofice. Recently she had left arm numbness in association with migraine, numbness, couldn't feel her arm and left facial numbness. She had facial droop with the headache (showed me a picture exhibiting left facial lower weakness). She has been to the ED and it has happened several times in the last several months. She often does not have an aura but also has an aura, loses vision, sparkles, shimmering.  Her right side has also been numb. Headaches are on the right side, pounding, throbbing, and behind the eye. She has 20 headache days out of the month and at least 1/2 are migrainous for the last 6 months and usually last all day if not longer. She has sensitivity to smells, nausea and vomiting. She also experienced confusion and memory changes during these episodes. No other focal neurologic deficits or complaints. Mother has migraines.  Reviewed notes, labs and imaging from outside physicians, which showed:   Patient has a history of migraine. Medications tried include: Reglan, Benadryl, Toradol.  BUN 11, creatinine 0.75 these labs drawn August 2017.   Personally reviewed MRI/MRA of the brain and agree with the following: IMPRESSION: 1. Normal MRI  of the brain. 2. Normal variant MRA circle of Willis without significant proximal stenosis, aneurysm, or branch vessel  occlusion.  CBC normal, CMP with elevated AST and ALT otherwise normal both drawn 11/28/2015.  Review of Systems: Patient complains of symptoms per HPI as well as the following symptoms: Fatigue, blurred vision, eye pain, flushing, ringing in ears, spinning sensation, allergies, confusion, headache, numbness, weakness, slurred speech, dizziness, sleepiness, restless legs, depression, anxiety, not enough sleep, decreased energy. Pertinent negatives per HPI. All others negative.    Social History   Social History  . Marital status: Single    Spouse name: N/A  . Number of children: N/A  . Years of education: N/A   Occupational History  . Not on file.   Social History Main Topics  . Smoking status: Never Smoker  . Smokeless tobacco: Never Used  . Alcohol use Yes     Comment: socially  . Drug use: No  . Sexual activity: Not on file   Other Topics Concern  . Not on file   Social History Narrative   Lives   Caffeine use:     Family History  Problem Relation Age of Onset  . Hypertension Mother   . Arthritis Mother   . Fibromyalgia Mother   . Hyperlipidemia Mother   . Diabetes Father   . Diabetes Paternal Grandmother   . Stroke Paternal Grandmother   . Kidney disease Paternal Grandmother   . Heart attack Paternal Grandmother   . Hypertension Paternal Grandmother   . Hyperlipidemia Paternal Grandmother   . Diabetes Paternal Grandfather   . Alcohol abuse Maternal Grandfather   . Cancer Maternal Grandfather     BRAIN    Past Medical History:  Diagnosis Date  . Allergy   . Anxiety   . Hematuria   . Hip dysplasia   . Hyperlipidemia   . Hypertension   . Migraine   . NASH (nonalcoholic steatohepatitis)   . Osteoporosis   . Scoliosis    minimal  . Thyroid disease   . Turner syndrome   . Vitamin D deficiency     Past Surgical History:  Procedure Laterality Date  . BILATERAL OOPHORECTOMY    . HERNIA REPAIR     x2  . HIP SURGERY    . TONSILLECTOMY    .  TYMPANOSTOMY TUBE PLACEMENT      Current Outpatient Prescriptions  Medication Sig Dispense Refill  . albuterol (PROVENTIL HFA;VENTOLIN HFA) 108 (90 Base) MCG/ACT inhaler Inhale 2 puffs into the lungs every 6 (six) hours as needed for wheezing. 1 Inhaler 0  . amLODipine (NORVASC) 5 MG tablet Take 1 tablet (5 mg total) by mouth daily. NEED FOLLOW UP APPOINTMENT FOR MORE REFILLS 15 tablet 0  . escitalopram (LEXAPRO) 10 MG tablet Take 1 tablet (10 mg total) by mouth daily. NEED FOLLOW UP APPOINTMENT FOR MORE REFILLS 15 tablet 0  . estrogens, conjugated, (PREMARIN) 1.25 MG tablet Take 1 tablet (1.25 mg total) by mouth daily. 90 tablet 1  . fexofenadine (ALLEGRA) 60 MG tablet Take 60 mg by mouth daily.      . fluticasone (FLONASE) 50 MCG/ACT nasal spray Place two sprays in each nostril once daily 16 g 1  . Fluticasone-Salmeterol (ADVAIR DISKUS) 100-50 MCG/DOSE AEPB Inhale 1 puff into the lungs 2 (two) times daily. Inhale 1 puff into lungs bid 60 each 3  . levothyroxine (SYNTHROID, LEVOTHROID) 100 MCG tablet Take 1 tablet (100 mcg total) by mouth daily before breakfast. 90 tablet 1  .  medroxyPROGESTERone (PROVERA) 10 MG tablet Take 1 tablet (10 mg total) by mouth daily. 10 days per month 30 tablet 1  . Multiple Vitamin (MULTIVITAMIN) tablet Take 1 tablet by mouth daily.      . naratriptan (AMERGE) 2.5 MG tablet TAKE 1 TABLET AS NEEDED MIGRAINE MAY REPEAT AFTER 4 HOURS MAX 2 PER 24 HOURS 20 tablet 1  . ondansetron (ZOFRAN ODT) 4 MG disintegrating tablet Take 1-2 tablets by mouth every 8 (eight) hours as needed for nausea or vomiting. 30 tablet 12  . SUMAtriptan Succinate (IMITREX PO) Take 1 tablet by mouth as needed.    Marland Kitchen tiZANidine (ZANAFLEX) 4 MG tablet TAKE 1 TABLET (4 MG TOTAL) BY MOUTH 3 (THREE) TIMES DAILY AS NEEDED FOR HEADACHE. 60 tablet 1  . topiramate (TOPAMAX) 100 MG tablet Take 1 tablet (100 mg total) by mouth at bedtime. 90 tablet 0   No current facility-administered medications for this  visit.     Allergies as of 05/20/2016 - Review Complete 04/29/2016  Allergen Reaction Noted  . Prednisone  07/22/2012    Vitals: BP 138/88 (BP Location: Right Arm, Patient Position: Sitting, Cuff Size: Large)   Pulse 89   Ht 5' 2"  (1.575 m)   Wt 175 lb 9.6 oz (79.7 kg)   BMI 32.12 kg/m  Last Weight:  Wt Readings from Last 1 Encounters:  05/20/16 175 lb 9.6 oz (79.7 kg)   Last Height:   Ht Readings from Last 1 Encounters:  05/20/16 5' 2"  (1.575 m)    Physical exam: Exam: Gen: NAD, conversant, well nourised, obese, well groomed                     CV: RRR, no MRG. No Carotid Bruits. No peripheral edema, warm, nontender Eyes: Conjunctivae clear without exudates or hemorrhage  Neuro: Detailed Neurologic Exam  Speech:    Speech is normal; fluent and spontaneous with normal comprehension.  Cognition:    The patient is oriented to person, place, and time;     recent and remote memory intact;     language fluent;     normal attention, concentration,     fund of knowledge Cranial Nerves:    The pupils are equal, round, and reactive to light. The fundi are normal and spontaneous venous pulsations are present. Visual fields are full to finger confrontation. Extraocular movements are intact. Trigeminal sensation is intact and the muscles of mastication are normal. The face is symmetric. The palate elevates in the midline. Hearing intact. Voice is normal. Shoulder shrug is normal. The tongue has normal motion without fasciculations.   Coordination:    Normal finger to nose and heel to shin. Normal rapid alternating movements.   Gait:    Heel-toe and tandem gait are normal.   Motor Observation:    No asymmetry, no atrophy, and no involuntary movements noted. Tone:    Normal muscle tone.    Posture:    Posture is normal. normal erect    Strength:    Strength is V/V in the upper and lower limbs.      Sensation: intact to LT     Reflex Exam:  DTR's:    Deep  tendon reflexes in the upper and lower extremities are normal bilaterally.   Toes:    The toes are downgoing bilaterally.   Clonus:    Clonus is absent.      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  As far as your medications are concerned, I would like to suggest: Start Nortriptyline 30m at night and in 1 weeks if no side effects increase to 2 pills at night. Email me in a few weeks and let me know how things are going. Stop Topiramate.  I would like to see you back in 3 months, sooner if we need to. Please call [facility] any interim questions, concerns, problems, updates or refill requests.   Migraine and musculoskeletal neck pain with cervicalgia, stress and depression: [facility] (PT, massage, stress feedback and acupuncture)  Discussed side effect of medications as per patient instructions. Discussed teratogenicity of medications.  To prevent or relieve headaches, try the following:  Cool Compress. Lie down and place a cool compress on your head.   Avoid headache triggers. If certain foods or odors seem to have triggered your migraines in the past, avoid them. A headache diary might help you identify triggers.   Include physical activity in your daily routine. Try a daily walk or other moderate aerobic exercise.   Manage stress. Find healthy ways to cope with the stressors, such as delegating tasks on your to-do list.   Practice relaxation techniques. Try deep breathing, yoga, massage and visualization.   Eat regularly. Eating regularly scheduled meals and maintaining a healthy diet might help prevent headaches. Also, drink plenty of fluids.   Follow a regular sleep schedule. Sleep deprivation might contribute to headaches  Consider biofeedback. With this mind-body technique, you learn to control certain bodily functions - such as muscle tension, heart rate and blood  pressure - to prevent headaches or reduce headache pain.    Proceed to emergency room if you experience new or worsening symptoms or symptoms do not resolve, if you have new neurologic symptoms or if headache is severe, or for any concerning symptom.  Cc: Dr. AVinetta Bergamo MD  [facility] [address] [address] Phone [phone] [phone]    Assessment/Plan:    ASarina Ill MD  [facility] [address] [place] [id number] Phone [phone] [phone] A total of 45 minutes was spent face-to-face with this patient. Over half this time was spent on counseling patient on the migraine, musculoskeletal neck pain diagnosis and different diagnostic and therapeutic options available.

## 2016-05-21 ENCOUNTER — Telehealth: Payer: Self-pay | Admitting: Neurology

## 2016-05-21 NOTE — Telephone Encounter (Signed)
Noted, thank you

## 2016-05-21 NOTE — Telephone Encounter (Signed)
Referral sent to [facility] fax [phone]. Thanks Hinton Dyer.

## 2016-06-05 ENCOUNTER — Encounter: Payer: Self-pay | Admitting: Neurology

## 2016-06-13 ENCOUNTER — Telehealth: Payer: Self-pay

## 2016-06-13 DIAGNOSIS — I10 Essential (primary) hypertension: Secondary | ICD-10-CM

## 2016-06-13 MED ORDER — AMLODIPINE BESYLATE 5 MG PO TABS: 5.0000 mg | ORAL_TABLET | Freq: Every day | ORAL | 0 refills | Status: DC

## 2016-06-13 NOTE — Telephone Encounter (Signed)
Patient request refill for Amlodipine. Patient advised that appointment is needed for further refills. Rhonda Cunningham,CMA

## 2016-06-30 ENCOUNTER — Ambulatory Visit: Payer: 59 | Admitting: Neurology

## 2016-07-07 NOTE — Telephone Encounter (Signed)
Received fax from [facility]. Patient originally scheduled 1st visit on Feb 19th but cancelled due to illness. They have left a message to r/s but have not heard back from patient. She has also cancelled her follow-up appt that was scheduled w/ Dr. Jaynee Eagles last week.

## 2016-07-13 ENCOUNTER — Other Ambulatory Visit: Payer: Self-pay | Admitting: Osteopathic Medicine

## 2016-07-13 DIAGNOSIS — I10 Essential (primary) hypertension: Secondary | ICD-10-CM

## 2016-07-13 DIAGNOSIS — F4323 Adjustment disorder with mixed anxiety and depressed mood: Secondary | ICD-10-CM

## 2016-08-18 ENCOUNTER — Emergency Department (HOSPITAL_BASED_OUTPATIENT_CLINIC_OR_DEPARTMENT_OTHER): Payer: 59

## 2016-08-18 ENCOUNTER — Encounter (HOSPITAL_BASED_OUTPATIENT_CLINIC_OR_DEPARTMENT_OTHER): Payer: Self-pay | Admitting: *Deleted

## 2016-08-18 ENCOUNTER — Emergency Department (HOSPITAL_BASED_OUTPATIENT_CLINIC_OR_DEPARTMENT_OTHER)
Admission: EM | Admit: 2016-08-18 | Discharge: 2016-08-18 | Disposition: A | Discharge status: Home / Self Care | Payer: 59 | Attending: Emergency Medicine | Admitting: Emergency Medicine

## 2016-08-18 DIAGNOSIS — S32020A Wedge compression fracture of second lumbar vertebra, initial encounter for closed fracture: Secondary | ICD-10-CM | POA: Diagnosis not present

## 2016-08-18 DIAGNOSIS — E039 Hypothyroidism, unspecified: Secondary | ICD-10-CM | POA: Diagnosis not present

## 2016-08-18 DIAGNOSIS — W1839XA Other fall on same level, initial encounter: Secondary | ICD-10-CM | POA: Diagnosis not present

## 2016-08-18 DIAGNOSIS — Y929 Unspecified place or not applicable: Secondary | ICD-10-CM | POA: Insufficient documentation

## 2016-08-18 DIAGNOSIS — I10 Essential (primary) hypertension: Secondary | ICD-10-CM | POA: Insufficient documentation

## 2016-08-18 DIAGNOSIS — S3992XA Unspecified injury of lower back, initial encounter: Secondary | ICD-10-CM | POA: Diagnosis present

## 2016-08-18 DIAGNOSIS — Y999 Unspecified external cause status: Secondary | ICD-10-CM | POA: Diagnosis not present

## 2016-08-18 DIAGNOSIS — Z79899 Other long term (current) drug therapy: Secondary | ICD-10-CM | POA: Insufficient documentation

## 2016-08-18 DIAGNOSIS — Y9389 Activity, other specified: Secondary | ICD-10-CM | POA: Insufficient documentation

## 2016-08-18 MED ORDER — MORPHINE SULFATE (PF) 4 MG/ML IV SOLN: 4.0000 mg | Freq: Once | INTRAVENOUS | Status: DC

## 2016-08-18 MED ORDER — OXYCODONE-ACETAMINOPHEN 5-325 MG PO TABS: 1.0000 | ORAL_TABLET | Freq: Four times a day (QID) | ORAL | 0 refills | Status: DC | PRN

## 2016-08-18 MED ORDER — OXYCODONE-ACETAMINOPHEN 5-325 MG PO TABS
1.0000 | ORAL_TABLET | Freq: Once | ORAL | Status: AC
  Administered 2016-08-18: 1 via ORAL
  Filled 2016-08-18: qty 1

## 2016-08-18 MED ORDER — KETOROLAC TROMETHAMINE 60 MG/2ML IM SOLN
60.0000 mg | Freq: Once | INTRAMUSCULAR | Status: AC
  Administered 2016-08-18: 60 mg via INTRAMUSCULAR
  Filled 2016-08-18: qty 2

## 2016-08-18 NOTE — ED Notes (Signed)
[employer] at bedside to fit and place brace on pt

## 2016-08-18 NOTE — ED Notes (Signed)
Pt sts she lifted 40 lb dog this a.m.

## 2016-08-18 NOTE — ED Notes (Signed)
Patient transported to X-ray 

## 2016-08-18 NOTE — ED Notes (Signed)
Pt unable to give UA

## 2016-08-18 NOTE — ED Notes (Signed)
[employer] and notified of back brace.  Stated that it could be and hour to an hour and a half until they could be here

## 2016-08-18 NOTE — ED Triage Notes (Signed)
Pt reports she was lifting a dog and felt a "pop" in her back. States her legs went weak. C/o pain in low back and has been able to ambulate with support

## 2016-08-18 NOTE — ED Provider Notes (Signed)
[facility] Provider Note   CSN: [id number] Arrival date & time: 08/18/16  1022     History   Chief Complaint Chief Complaint  Patient presents with  . Back Pain    HPI Cristina Zimmerman is a 31 y.o. female PMHx turner syndrome, scoliosis, HTN, anxiety presents today with complaints of low back pain since 9am today. She states this happened after lifting a 40 lb dog and twist and heard a "pop". She states she then fell because she felt her legs gave out from so much pain. She reports her pain as achy, dull, burning, constant, unchanged, 7-8/10 pain. She reports movement and twisting makes her symptoms worse. She reports associated radiation of pain down her right hip and right leg. Patient does admit to subjective numbness to her right hand and right leg. She denies fever, chills, nausea, vomiting, diarrhea, bowel incontinence, urinary incontinence, dysuria, history of cancer, and hx of IVDU.   The history is provided by the patient. No language interpreter was used.  Back Pain   Associated symptoms include numbness. Pertinent negatives include no fever.    Past Medical History:  Diagnosis Date  . Allergy   . Anxiety   . Hematuria   . Hip dysplasia   . Hyperlipidemia   . Hypertension   . Migraine   . NASH (nonalcoholic steatohepatitis)   . Osteoporosis   . Scoliosis    minimal  . Thyroid disease   . Turner syndrome   . Vitamin D deficiency     Patient Active Problem List   Diagnosis Date Noted  . Chronic migraine without aura with status migrainosus, not intractable 12/03/2015  . Migraine with aura and with status migrainosus 12/03/2015  . Migraine with aura and without status migrainosus, not intractable 11/22/2015  . Hypothyroidism 07/22/2012  . Childhood asthma 07/20/2012  . Bronchospasm 07/20/2012  . Allergy   . Anxiety   . Turner syndrome   . Hip dysplasia   . Vitamin D deficiency   . Hematuria   . Hyperlipidemia   . Hypertension   .  Osteoporosis   . NASH (nonalcoholic steatohepatitis)   . Scoliosis     Past Surgical History:  Procedure Laterality Date  . BILATERAL OOPHORECTOMY    . HERNIA REPAIR     x2  . HIP SURGERY    . TONSILLECTOMY    . TYMPANOSTOMY TUBE PLACEMENT      OB History    No data available       Home Medications    Prior to Admission medications   Medication Sig Start Date End Date Taking? Authorizing Provider  albuterol (PROVENTIL HFA;VENTOLIN HFA) 108 (90 Base) MCG/ACT inhaler Inhale 2 puffs into the lungs every 6 (six) hours as needed for wheezing. 05/24/15  Yes Noland Fordyce, PA-C  amLODipine (NORVASC) 5 MG tablet Take 1 tablet (5 mg total) by mouth daily. NEED FOLLOW UP APPOINTMENT FOR MORE REFILLS 06/13/16  Yes Emeterio Reeve, DO  amLODipine (NORVASC) 5 MG tablet Take 1 tablet (5 mg total) by mouth daily. NO MORE REFILLS WITHOUT APPOINTMENT 07/14/16  Yes Emeterio Reeve, DO  escitalopram (LEXAPRO) 10 MG tablet Take 1 tablet (10 mg total) by mouth daily. NO MORE REFILLS WITHOUT APPOINTMENT 07/14/16  Yes Emeterio Reeve, DO  fexofenadine (ALLEGRA) 60 MG tablet Take 60 mg by mouth daily.     Yes Historical Provider, MD  fluticasone Asencion Islam) 50 MCG/ACT nasal spray Place two sprays in each nostril once daily 05/24/15  Yes Noland Fordyce,  PA-C  Fluticasone-Salmeterol (ADVAIR DISKUS) 100-50 MCG/DOSE AEPB Inhale 1 puff into the lungs 2 (two) times daily. Inhale 1 puff into lungs bid 09/13/15  Yes Emeterio Reeve, DO  Multiple Vitamin (MULTIVITAMIN) tablet Take 1 tablet by mouth daily.     Yes Historical Provider, MD  naratriptan (AMERGE) 2.5 MG tablet TAKE 1 TABLET AS NEEDED MIGRAINE MAY REPEAT AFTER 4 HOURS MAX 2 PER 24 HOURS 03/21/16  Yes Emeterio Reeve, DO  nortriptyline (PAMELOR) 10 MG capsule Take 2 capsules (20 mg total) by mouth at bedtime. 05/20/16  Yes Melvenia Beam, MD  ondansetron (ZOFRAN ODT) 4 MG disintegrating tablet Take 1-2 tablets by mouth every 8 (eight) hours as needed for  nausea or vomiting. 12/03/15  Yes Melvenia Beam, MD  promethazine (PHENERGAN) 12.5 MG tablet Take 1 tablet (12.5 mg total) by mouth every 6 (six) hours as needed for nausea or vomiting. 05/20/16  Yes Melvenia Beam, MD  estrogens, conjugated, (PREMARIN) 1.25 MG tablet Take 1 tablet (1.25 mg total) by mouth daily. 09/13/15   Emeterio Reeve, DO  levothyroxine (SYNTHROID, LEVOTHROID) 100 MCG tablet Take 1 tablet (100 mcg total) by mouth daily before breakfast. 09/13/15   Emeterio Reeve, DO  medroxyPROGESTERone (PROVERA) 10 MG tablet Take 1 tablet (10 mg total) by mouth daily. 10 days per month 09/13/15   Emeterio Reeve, DO  oxyCODONE-acetaminophen (PERCOCET/ROXICET) 5-325 MG tablet Take 1-2 tablets by mouth every 6 (six) hours as needed for severe pain. 08/18/16   Aalijah Mims Manuel [place], [place]  SUMAtriptan Succinate (IMITREX PO) Take 1 tablet by mouth as needed.    Historical Provider, MD  tiZANidine (ZANAFLEX) 4 MG tablet TAKE 1 TABLET (4 MG TOTAL) BY MOUTH 3 (THREE) TIMES DAILY AS NEEDED FOR HEADACHE. 04/30/16   Melvenia Beam, MD    Family History Family History  Problem Relation Age of Onset  . Hypertension Mother   . Arthritis Mother   . Fibromyalgia Mother   . Hyperlipidemia Mother   . Diabetes Father   . Diabetes Paternal Grandmother   . Stroke Paternal Grandmother   . Kidney disease Paternal Grandmother   . Heart attack Paternal Grandmother   . Hypertension Paternal Grandmother   . Hyperlipidemia Paternal Grandmother   . Diabetes Paternal Grandfather   . Alcohol abuse Maternal Grandfather   . Cancer Maternal Grandfather     BRAIN    Social History Social History  Substance Use Topics  . Smoking status: Never Smoker  . Smokeless tobacco: Never Used  . Alcohol use Yes     Comment: socially     Allergies   Prednisone   Review of Systems Review of Systems  Constitutional: Negative for chills and fever.  Musculoskeletal: Positive for back pain.  Neurological:  Positive for numbness.     Physical Exam Updated Vital Signs BP (!) 133/100 (BP Location: Right Arm)   Pulse 88   Temp 98.4 F (36.9 C) (Oral)   Resp 18   Ht 5' 2"  (1.575 m)   Wt 74.8 kg   SpO2 98%   BMI 30.18 kg/m   Physical Exam  Constitutional: She is oriented to person, place, and time. She appears well-developed and well-nourished.  Well appearing  HENT:  Head: Normocephalic and atraumatic.  Nose: Nose normal.  Mouth/Throat: Oropharynx is clear and moist.  Eyes: Conjunctivae and EOM are normal. Pupils are equal, round, and reactive to light.  Neck: Normal range of motion.  Normal ROM, no neck tenderness. No nuchal rigidity  Cardiovascular:  Normal rate, normal heart sounds and intact distal pulses.   Pulmonary/Chest: Effort normal and breath sounds normal. No respiratory distress.  Normal work of breathing. No respiratory distress noted.   Abdominal: Soft. There is no tenderness. There is no rebound and no guarding.  Soft and nontender. No rebound or guarding. No pulsatile mass noted.   Musculoskeletal: Normal range of motion. She exhibits tenderness. She exhibits no deformity.  There is tenderness to midline lumbar spine and right lower back. No midline cervical, thoracic spine tenderness. Good ROM of spine. No deformity. No obvious wound, redness, or swelling noted.   Neurological: She is alert and oriented to person, place, and time.  Cranial Nerves:  III,IV, VI: ptosis not present, extra-ocular movements intact bilaterally, direct and consensual pupillary light reflexes intact bilaterally V: facial sensation, jaw opening, and bite strength equal bilaterally VII: eyebrow raise, eyelid close, smile, frown, pucker equal bilaterally VIII: hearing grossly normal bilaterally  IX,X: palate elevation and swallowing intact XI: bilateral shoulder shrug and lateral head rotation equal and strong XII: midline tongue extension  Negative pronator drift, negative Romberg,  negative RAM's, negative heel-to-shin, negative finger to nose.    Subjective new right leg numbness. Sensory otherwise intact.  Muscle strength 5/5 Patient able to ambulate without difficulty.   SLR Right -  positive SLR Left -  negative  Skin: Skin is warm.  Psychiatric: She has a normal mood and affect. Her behavior is normal.  Nursing note and vitals reviewed.    ED Treatments / Results  Labs (all labs ordered are listed, but only abnormal results are displayed) Labs Reviewed - No data to display  EKG  EKG Interpretation None       Radiology Dg Lumbar Spine Complete  Result Date: 08/18/2016 CLINICAL DATA:  Low back pain.  No reported injury. EXAM: LUMBAR SPINE - COMPLETE 4+ VIEW COMPARISON:  09/01/2011 lumbar spine radiographs. FINDINGS: This report assumes 5 non rib-bearing lumbar vertebrae. There is a mild anterior superior L2 vertebral compression fracture of indeterminate chronicity, new since 09/01/2011. There are stable limbus vertebrae at the anterior inferior T11 and anterior superior L4 vertebral bodies. Remaining vertebral body heights are preserved with no additional fractures. Straightening of the lumbar spine. Mild degenerative disc disease at L1-2 and L5-S1 with interval progression at these levels. No spondylolisthesis. No significant facet arthropathy. No aggressive appearing focal osseous lesions. IMPRESSION: 1. Mild L2 vertebral compression fracture of indeterminate chronicity, new since 09/01/2011. 2. No spondylolisthesis. 3. Straightening of the lumbar spine, usually due to positioning and/or muscle spasm . 4. Mild multilevel degenerative disc disease in the lumbar spine with interval progression. Electronically Signed   By: Ilona Sorrel M.D.   On: 08/18/2016 14:10   Ct Lumbar Spine Wo Contrast  Result Date: 08/18/2016 CLINICAL DATA:  Low back pain due to lifting up dog today. Initial encounter. EXAM: CT LUMBAR SPINE WITHOUT CONTRAST TECHNIQUE: Multidetector CT  imaging of the lumbar spine was performed without intravenous contrast administration. Multiplanar CT image reconstructions were also generated. COMPARISON:  08/18/2016 FINDINGS: Segmentation: 5 lumbar type vertebrae. Alignment: Normal. Vertebrae: L2 superior endplate and anterior cortex fractures with mild depression, ~15%. No retropulsion or posterior cortex involvement. No subluxation. Patient has known history of osteoporosis. L4 limbus vertebra, incidental. Paraspinal and other soft tissues: No acute finding. Disc levels: L5-S1 disc degeneration with moderate narrowing and vacuum phenomenon. There is annulus bulging and mild endplate spurring. No suspected impingement. IMPRESSION: 1. Acute L2 superior endplate fracture with 67-61% height loss.  2. L5-S1 disc degeneration. Electronically Signed   By: Monte Fantasia M.D.   On: 08/18/2016 16:00    Procedures Procedures (including critical care time)  Medications Ordered in ED Medications  oxyCODONE-acetaminophen (PERCOCET/ROXICET) 5-325 MG per tablet 1 tablet (not administered)  ketorolac (TORADOL) injection 60 mg (60 mg Intramuscular Given 08/18/16 1611)     Initial Impression / Assessment and Plan / ED Course  I have reviewed the triage vital signs and the nursing notes.  Pertinent labs & imaging results that were available during my care of the patient were reviewed by me and considered in my medical decision making (see chart for details).    Patient here with apparent acute L2 superior endplate fracture with 50-27% height loss via CT. X-ray also showed mild L2 vertebral compression fractures of indeterminate chronicity. Patient is in no apparent distress, afebrile, hemodynamically stable. Patient has subjective complaints of slight numbness to her right hand and down her right leg. She is otherwise neurologically intact, coordination intact, patient able to stand and ambulate on exam. I spoke with Dr. Ellene Route who recommended to have a CT of  her lumbar spine. He saw  CT and recommended a TLSO brace and to see her outpatient in 2-3 weeks where she would get a repeat xray at his office.  Pain managed in ED.  Awaiting for TLSO brace.  At shift change care was transferred to Lorre Munroe, PA-C who will follow pt and disposition once she has the TLSO brace fitted and placed by representative.   Final Clinical Impressions(s) / ED Diagnoses   Final diagnoses:  Closed compression fracture of second lumbar vertebra, initial encounter (HCC)    New Prescriptions New Prescriptions   OXYCODONE-ACETAMINOPHEN (PERCOCET/ROXICET) 5-325 MG TABLET    Take 1-2 tablets by mouth every 6 (six) hours as needed for severe pain.     [place], [place] 08/18/16 [place], DO 08/18/16 1744

## 2016-08-18 NOTE — ED Notes (Signed)
Patient transported to CT 

## 2016-08-18 NOTE — Discharge Instructions (Signed)
Please follow up with Dr. Danielle Dess in 2 weeks regarding today's visit. Please make sure to keep TLSO brace on at all times. Please also follow up with your primary care provider regarding today's visit and to have a new DEXA scan done. Take Tylenol as needed for pain. Please only take Percocet as needed for severe pain.  CT Result:  IMPRESSION:  1. Acute L2 superior endplate fracture with 10-20% height loss.  2. L5-S1 disc degeneration.    Contact a health care provider if: Your pain does not improve over time. You have a persistent cough. You cannot return to your normal activities as planned or expected. Get help right away if: You have severe pain or your pain suddenly gets worse. You are unable to move. You have numbness, tingling, weakness, or paralysis in any part of your body. You cannot control your bladder or bowel. You have difficulty breathing. You have a fever. You have pain in your chest or abdomen. You vomit.

## 2016-08-19 HISTORY — PX: KYPHOPLASTY: SHX5884

## 2016-08-21 ENCOUNTER — Encounter: Payer: Self-pay | Admitting: Neurology

## 2016-08-22 ENCOUNTER — Encounter: Payer: Self-pay | Admitting: Family Medicine

## 2016-08-22 ENCOUNTER — Ambulatory Visit (INDEPENDENT_AMBULATORY_CARE_PROVIDER_SITE_OTHER): Payer: 59 | Admitting: Family Medicine

## 2016-08-22 DIAGNOSIS — M4850XA Collapsed vertebra, not elsewhere classified, site unspecified, initial encounter for fracture: Secondary | ICD-10-CM

## 2016-08-22 DIAGNOSIS — IMO0001 Reserved for inherently not codable concepts without codable children: Secondary | ICD-10-CM

## 2016-08-22 MED ORDER — HYDROCODONE-ACETAMINOPHEN 5-325 MG PO TABS: 1.0000 | ORAL_TABLET | Freq: Four times a day (QID) | ORAL | 0 refills | Status: DC | PRN

## 2016-08-22 MED ORDER — CALCITONIN (SALMON) 200 UNIT/ACT NA SOLN: 1.0000 | Freq: Every day | NASAL | 11 refills | Status: DC

## 2016-08-22 NOTE — Progress Notes (Signed)
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 She has been intermittently on and off hormone replacement therapy. She was doing reasonably well until a few days ago when she was turned forcefully by a dog leash. She felt a pop in her back and had immediate pain. She was seen in the emergency room on April 30 where she was found to have an L2 vertebral compression fracture. She was treated with a back brace and oxycodone. She has a follow-up appointment with a neurosurgeon in a few weeks. She denies any radiating pain weakness or numbness. Overall her pain is still quite severe at times. The brace definitely helps. She's trying to avoid taking oxycodone possible because it makes her nauseated.  Past medical history, Surgical history, Family history not pertinant except as noted below, Social history, Allergies, and medications have been entered into the medical record, reviewed, and no changes needed.   Review of Systems: No headache, visual changes, nausea, vomiting, diarrhea, constipation, dizziness, abdominal pain, skin rash, fevers, chills, night sweats, weight loss, swollen lymph nodes, body aches, joint swelling, muscle aches, chest pain, shortness of breath, mood changes, visual or auditory hallucinations.   Objective:    Vitals:   08/22/16 1159  BP: (!) 133/94  Pulse: (!) 107   General: Well Developed, well nourished, and in no acute distress.  Neuro/Psych: Alert and oriented x3, extra-ocular muscles intact, able to move all 4 extremities, sensation grossly intact. Skin: Warm and dry, no rashes noted.  Respiratory: Not using accessory muscles, speaking in full sentences, trachea midline.  Cardiovascular: Pulses palpable, no extremity edema. Abdomen: Does not appear distended. MSK: L-spine  tender normal gait.  Study Result   CLINICAL DATA:  Low back pain due to lifting up dog today. Initial encounter.  EXAM: CT LUMBAR SPINE WITHOUT CONTRAST  TECHNIQUE: Multidetector CT imaging of the lumbar spine was performed without intravenous contrast administration. Multiplanar CT image reconstructions were also generated.  COMPARISON:  08/18/2016  FINDINGS: Segmentation: 5 lumbar type vertebrae.  Alignment: Normal.  Vertebrae: L2 superior endplate and anterior cortex fractures with mild depression, ~15%. No retropulsion or posterior cortex involvement. No subluxation. Patient has known history of osteoporosis.  L4 limbus vertebra, incidental.  Paraspinal and other soft tissues: No acute finding.  Disc levels: L5-S1 disc degeneration with moderate narrowing and vacuum phenomenon. There is annulus bulging and mild endplate spurring. No suspected impingement.  IMPRESSION: 1. Acute L2 superior endplate fracture with 10-20% height loss. 2. L5-S1 disc degeneration.   Electronically Signed   By: Marnee SpringJonathon  Watts M.D.   On: 08/18/2016 16:00     No results found for this or any previous visit (from the past 24 hour(s)). No results found.  Impression and Recommendations:    Assessment and Plan: 31 y.o. female with Vertebral compression fracture. No neuro impingement. Plan to continue back brace. Switch to intermittent occasional Norco for severe pain control. Additionally we'll see use nasal calcitonin. Although patient is a 31 year old woman she is acting more like a postmenopausal woman due to her osteoporosis due to her Turner's syndrome. I think this fracture is essentially the same as if she were 31 years old and had an insufficiency compression fracture.  Plan to recheck in a week or so to recheck pain control as well as address her job situation. I  do think is probably a good idea to keep her appointment with neurosurgery. Ultimately if not better we  may consider vertebral plasty.  Patient was researched in the [place] controlled substance reporting system.   Discussed warning signs or symptoms. Please see discharge instructions. Patient expresses understanding.

## 2016-08-22 NOTE — Patient Instructions (Signed)
Thank you for coming in today. Start calcitonin nasal spray.  Continue the brace Follow up with the neurosurgeon.  Return to me in 1-2 weeks.    Vertebral Fracture A vertebral fracture is a break in one of the bones that make up the spine (vertebrae). The vertebrae are stacked on top of each other to form the spinal column. They support the body and protect the spinal cord. The vertebral column has an upper part (cervical spine), a middle part (thoracic spine), and a lower part (lumbar spine). Most vertebral fractures occur in the thoracic spine or lumbar spine. There are three main types of vertebral fractures:  Flexion fracture. This happens when vertebrae collapse. Vertebrae can collapse:  In the front (compression fracture). This type of fracture is common in people who have a condition that causes their bones to be weak and brittle (osteoporosis). The fracture can make a person lose height.  In the front and back (axial burst fracture).  Extension fracture. This happens when an external force pulls apart the vertebrae.  Rotation fracture. This happens when the spine bends extremely in one direction. This type can cause a piece of a vertebra to break off (transverse process fracture) or move out of its normal position (fracture dislocation). This type of fracture has a high risk for spinal cord injury. Vertebral fractures can range from mild to very severe. The most severe types are those that cause the broken bones to move out of place (unstable) and those that injure or press on the spinal cord. What are the causes? This condition is usually caused by a forceful injury. This type of injury commonly results from:  Car accidents.  Falling or jumping from a great height.  Collisions in contact sports.  Violent acts, such as an assault or a gunshot wound. What increases the risk? This injury is more likely to happen to people who:  Have osteoporosis.  Participate in contact  sports.  Are in situations that could result in falls or other violent injuries. What are the signs or symptoms? Symptoms of this injury depend on the location and the type of fracture. The most common symptom is back pain that gets worse with movement. You may also have trouble standing or walking. If a fracture has damaged your spinal cord or is pressing on it, you may also have:  Numbness.  Tingling.  Weakness.  Loss of movement.  Loss of bowel or bladder control. How is this diagnosed? This injury may be diagnosed based on symptoms, medical history, and a physical exam. You may also have imaging tests to confirm the diagnosis. These may include:  Spine X-ray.  CT scan.  MRI. How is this treated? Treatment for this injury depends on the type of fracture. If your fracture is stable and does not affect your spinal cord, it may heal with nonsurgical treatment, such as:  Taking pain medicine.  Wearing a cast or a brace.  Doing physical therapy exercises. If your vertebral fracture is unstable or it affects your spinal cord, you may need surgical treatment, such as:  Laminectomy. This procedure involves removing the part of a vertebra that is pushing on the spinal cord (spinal decompression surgery). Bone fragments may also be removed.  Spinal fusion. This procedure is used to stabilize an unstable fracture. Vertebrae may be joined together with a piece of bone from another part of your body (graft) and held in place with rods, plates, or screws.  Vertebroplasty. In this procedure, bone cement  is used to rebuild collapsed vertebrae. Follow these instructions at home: General instructions   Take medicines only as directed by your health care provider.  Do not drive or operate heavy machinery while taking pain medicine.  If directed, apply ice to the injured area:  Put ice in a plastic bag.  Place a towel between your skin and the bag.  Leave the ice on for 30 minutes  every two hours at first. Then apply the ice as needed.  Wear your neck brace or back brace as directed by your health care provider.  Do not drink alcohol. Alcohol can interfere with your treatment.  Keep all follow-up visits as directed by your health care provider. This is important. It can help to prevent permanent injury, disability, and long-lasting (chronic) pain. Activity   Stay in bed (on bed rest) only as directed by your health care provider. Being on bed rest for too long can make your condition worse.  Return to your normal activities as directed by your health care provider. Ask what activities are safe for you.  Do exercises to improve motion and strength in your back (physical therapy), as recommended by your health care provider.  Exercise regularly as directed by your health care provider. Contact a health care provider if:  You have a fever.  You develop a cough that makes your pain worse.  Your pain medicine is not helping.  Your pain does not get better over time.  You cannot return to your normal activities as planned or expected. Get help right away if:  Your pain is very bad and it suddenly gets worse.  You are unable to move any body part (paralysis) that is below the level of your injury.  You have numbness, tingling, or weakness in any body part that is below the level of your injury.  You cannot control your bladder or bowels. This information is not intended to replace advice given to you by your health care provider. Make sure you discuss any questions you have with your health care provider. Document Released: 05/15/2004 Document Revised: 09/19/2015 Document Reviewed: 04/12/2014 Elsevier Interactive Patient Education  2017 [employer].

## 2016-08-25 ENCOUNTER — Telehealth: Payer: Self-pay

## 2016-08-25 DIAGNOSIS — F4323 Adjustment disorder with mixed anxiety and depressed mood: Secondary | ICD-10-CM

## 2016-08-25 DIAGNOSIS — I10 Essential (primary) hypertension: Secondary | ICD-10-CM

## 2016-08-25 NOTE — Telephone Encounter (Signed)
[facility] prior to appointment on Friday.  She is also asking for a refill on Lexapro and Norvasc.  She is asking if you can refill these or if she needs to make an appointment with Dr. Lyn HollingsheadAlexander.  Please advise.

## 2016-08-26 MED ORDER — AMLODIPINE BESYLATE 5 MG PO TABS: 5.0000 mg | ORAL_TABLET | Freq: Every day | ORAL | 0 refills | Status: DC

## 2016-08-26 MED ORDER — ESCITALOPRAM OXALATE 10 MG PO TABS: 10.0000 mg | ORAL_TABLET | Freq: Every day | ORAL | 0 refills | Status: DC

## 2016-08-26 NOTE — Telephone Encounter (Signed)
Bring the paperwork.  I will provide a 1 month refill of medicine .... But Cristina Zimmerman should follow up with Dr Sheppard Coil soon to go over these issues soon.

## 2016-08-26 NOTE — Telephone Encounter (Signed)
Attempted to inform pt, VM was full.  Unable to leave message.

## 2016-08-29 ENCOUNTER — Ambulatory Visit (INDEPENDENT_AMBULATORY_CARE_PROVIDER_SITE_OTHER): Payer: 59 | Admitting: Family Medicine

## 2016-08-29 ENCOUNTER — Encounter: Payer: Self-pay | Admitting: Family Medicine

## 2016-08-29 VITALS — BP 138/94 | HR 122 | Wt 172.0 lb

## 2016-08-29 DIAGNOSIS — IMO0001 Reserved for inherently not codable concepts without codable children: Secondary | ICD-10-CM

## 2016-08-29 DIAGNOSIS — M4850XA Collapsed vertebra, not elsewhere classified, site unspecified, initial encounter for fracture: Secondary | ICD-10-CM

## 2016-08-29 DIAGNOSIS — E039 Hypothyroidism, unspecified: Secondary | ICD-10-CM

## 2016-08-29 DIAGNOSIS — Q969 Turner's syndrome, unspecified: Secondary | ICD-10-CM

## 2016-08-29 MED ORDER — ESTROGENS CONJUGATED 1.25 MG PO TABS: 1.2500 mg | ORAL_TABLET | Freq: Every day | ORAL | 1 refills | Status: DC

## 2016-08-29 MED ORDER — MEDROXYPROGESTERONE ACETATE 10 MG PO TABS: 10.0000 mg | ORAL_TABLET | Freq: Every day | ORAL | 1 refills | Status: DC

## 2016-08-29 MED ORDER — LEVOTHYROXINE SODIUM 100 MCG PO TABS: 100.0000 ug | ORAL_TABLET | Freq: Every day | ORAL | 0 refills | Status: DC

## 2016-08-29 NOTE — Progress Notes (Signed)
Cristina Zimmerman is a 31 y.o. female who presents to [facility] today for follow-up back pain. Patient was seen May 4 for vertebral compression fracture. 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  She was seen in emergency room where the compression fracture was confirmed on CT scan. She was treated initially with back brace NSAIDs and opiates. On May 4 she was prescribed calcitonin nasal spray. She has since then her pain is slightly improved. She notes that she still unable to work because she cannot sit for any prolonged period of time without pain. She denies any radiating pain weakness or numbness. She notes she has a follow-up appointment as originally scheduled by the emergency room with neurosurgery (Dr Ellene Route) on May 16.   Past Medical History:  Diagnosis Date  . Allergy   . Anxiety   . Hematuria   . Hip dysplasia   . Hyperlipidemia   . Hypertension   . Migraine   . NASH (nonalcoholic steatohepatitis)   . Osteoporosis   . Scoliosis    minimal  . Thyroid disease   . Turner syndrome   . Vitamin D deficiency    Past Surgical History:  Procedure Laterality Date  . BILATERAL OOPHORECTOMY    . HERNIA REPAIR     x2  . HIP SURGERY    . TONSILLECTOMY    . TYMPANOSTOMY TUBE PLACEMENT     Social History  Substance Use Topics  . Smoking status: Never Smoker  . Smokeless tobacco: Never Used  . Alcohol use Yes     Comment: socially     ROS:  As above   Medications: Current Outpatient Prescriptions  Medication Sig Dispense Refill  . albuterol (PROVENTIL HFA;VENTOLIN HFA) 108 (90 Base) MCG/ACT inhaler Inhale 2 puffs into the lungs every 6 (six) hours as needed for wheezing. 1 Inhaler 0  . amLODipine (NORVASC) 5 MG tablet Take 1 tablet (5 mg total) by mouth daily. NEED FOLLOW UP APPOINTMENT FOR MORE REFILLS 30 tablet 0  . calcitonin, salmon, (MIACALCIN) 200 UNIT/ACT  nasal spray Place 1 spray into alternate nostrils daily. 3.7 mL 11  . escitalopram (LEXAPRO) 10 MG tablet Take 1 tablet (10 mg total) by mouth daily. NO MORE REFILLS WITHOUT APPOINTMENT 30 tablet 0  . estrogens, conjugated, (PREMARIN) 1.25 MG tablet Take 1 tablet (1.25 mg total) by mouth daily. 90 tablet 1  . fexofenadine (ALLEGRA) 60 MG tablet Take 60 mg by mouth daily.      . fluticasone (FLONASE) 50 MCG/ACT nasal spray Place two sprays in each nostril once daily 16 g 1  . Fluticasone-Salmeterol (ADVAIR DISKUS) 100-50 MCG/DOSE AEPB Inhale 1 puff into the lungs 2 (two) times daily. Inhale 1 puff into lungs bid 60 each 3  . HYDROcodone-acetaminophen (NORCO/VICODIN) 5-325 MG tablet Take 1 tablet by mouth every 6 (six) hours as needed. 20 tablet 0  . levothyroxine (SYNTHROID, LEVOTHROID) 100 MCG tablet Take 1 tablet (100 mcg total) by mouth daily before breakfast. 90 tablet 0  . medroxyPROGESTERone (PROVERA) 10 MG tablet Take 1 tablet (10 mg total) by mouth daily. 10 days per month 30 tablet 1  . Multiple Vitamin (MULTIVITAMIN) tablet Take 1 tablet by mouth daily.      . naratriptan (AMERGE) 2.5 MG tablet TAKE 1 TABLET AS NEEDED MIGRAINE MAY REPEAT AFTER 4 HOURS MAX 2 PER 24 HOURS 20 tablet 1  . nortriptyline (PAMELOR) 10 MG capsule Take  2 capsules (20 mg total) by mouth at bedtime. 60 capsule 6  . ondansetron (ZOFRAN ODT) 4 MG disintegrating tablet Take 1-2 tablets by mouth every 8 (eight) hours as needed for nausea or vomiting. 30 tablet 12  . promethazine (PHENERGAN) 12.5 MG tablet Take 1 tablet (12.5 mg total) by mouth every 6 (six) hours as needed for nausea or vomiting. 30 tablet 6   No current facility-administered medications for this visit.    Allergies  Allergen Reactions  . Prednisone     PO prednisone only- Jittery, anxious, elevated blood pressure.   Pt does "okay" with steroid injections     Exam:  BP (!) 138/94   Pulse (!) 122   Wt 172 lb (78 kg)   BMI 31.46 kg/m  General:  Well Developed, well nourished, and in no acute distress.  Neuro/Psych: Alert and oriented x3, extra-ocular muscles intact, able to move all 4 extremities, sensation grossly intact. Skin: Warm and dry, no rashes noted.  Respiratory: Not using accessory muscles, speaking in full sentences, trachea midline.  Cardiovascular: Pulses palpable, no extremity edema. Abdomen: Does not appear distended. MSK: Spine scoliosis present. Tender to palpation along the midline of the lumbar spine. Lower extremities strength is intact.  Study Result   CLINICAL DATA:  Low back pain due to lifting up dog today. Initial encounter.  EXAM: CT LUMBAR SPINE WITHOUT CONTRAST  TECHNIQUE: Multidetector CT imaging of the lumbar spine was performed without intravenous contrast administration. Multiplanar CT image reconstructions were also generated.  COMPARISON:  08/18/2016  FINDINGS: Segmentation: 5 lumbar type vertebrae.  Alignment: Normal.  Vertebrae: L2 superior endplate and anterior cortex fractures with mild depression, ~15%. No retropulsion or posterior cortex involvement. No subluxation. Patient has known history of osteoporosis.  L4 limbus vertebra, incidental.  Paraspinal and other soft tissues: No acute finding.  Disc levels: L5-S1 disc degeneration with moderate narrowing and vacuum phenomenon. There is annulus bulging and mild endplate spurring. No suspected impingement.  IMPRESSION: 1. Acute L2 superior endplate fracture with 17-40% height loss. 2. L5-S1 disc degeneration.   Electronically Signed   By: Monte Fantasia M.D.   On: 08/18/2016 16:00     No results found for this or any previous visit (from the past 48 hour(s)). No results found.    Assessment and Plan: 31 y.o. female with  Lumbar vertebral compression fracture likely due to osteoporosis insufficiency. Patient is still pretty symptomatic but slightly improved with bracing NSAIDs and calcitonin. I  think she'll probably get better without the need for vertebroplasty.  I think is probably a good idea to follow-up with neurosurgery although on happy to take over care if she does not need any neurosurgical interventions. Appreciate recommendations regarding vertebroplasty/kyphoplasty.  I think her compression fracture is more simmilar to an insufficiency fracture in an older woman than an acute traumatic compression fracture/burst fracture. She notes she felt a pop before she fell.  As for her amenorrhea related to ovarian failure due to Turner syndrome. Will restart hormone replacement therapy with Premarin and Provera.  Will work on osteoporosis by starting vitamin D supplementation along with calcium. We'll recheck bone density test following resolution of acute compression fracture.  Refill Basic chronic medications and follow-up with PCP in a few weeks.  Recheck with me in 2 weeks. FMLA and short-term disability forms filled out today. I wrote out of work until July 30 although I think she'll be back sooner than that.   No orders of the defined types were  placed in this encounter.   Discussed warning signs or symptoms. Please see discharge instructions. Patient expresses understanding.  CC: Dr Ellene Route

## 2016-08-29 NOTE — Patient Instructions (Signed)
Thank you for coming in today. Follow up with surgery about your back.  Recheck in 2 weeks.  We may consider plasty.  Restart chronic medicines.  Make an appointment with Dr Lyn HollingsheadAlexander for 6 weeks.  Let me know if you have any barrier to getting premarin or provera.

## 2016-09-11 ENCOUNTER — Telehealth: Payer: Self-pay

## 2016-09-11 NOTE — Telephone Encounter (Signed)
Pt called wanting to make you aware that she is scheduled for kyphoplasty on 09/12/16. Transferred to scheduling to reschedule FU appt.

## 2016-09-12 ENCOUNTER — Ambulatory Visit: Payer: 59 | Admitting: Family Medicine

## 2016-09-12 NOTE — Telephone Encounter (Signed)
Noted  

## 2016-09-17 LAB — LIPID PANEL
Cholesterol: 321 — AB (ref 0–200)
HDL: 39 (ref 35–70)
LDL Cholesterol: 226
Triglycerides: 279 — AB (ref 40–160)

## 2016-10-10 ENCOUNTER — Ambulatory Visit: Payer: 59 | Admitting: Osteopathic Medicine

## 2016-10-18 ENCOUNTER — Other Ambulatory Visit: Payer: Self-pay | Admitting: Family Medicine

## 2016-10-18 DIAGNOSIS — I10 Essential (primary) hypertension: Secondary | ICD-10-CM

## 2016-10-18 DIAGNOSIS — F4323 Adjustment disorder with mixed anxiety and depressed mood: Secondary | ICD-10-CM

## 2016-11-26 ENCOUNTER — Other Ambulatory Visit: Payer: Self-pay | Admitting: *Deleted

## 2016-11-26 MED ORDER — NARATRIPTAN HCL 2.5 MG PO TABS: ORAL_TABLET | ORAL | 1 refills | Status: DC

## 2016-12-27 ENCOUNTER — Other Ambulatory Visit: Payer: Self-pay | Admitting: Family Medicine

## 2016-12-30 ENCOUNTER — Encounter: Payer: Self-pay | Admitting: Neurology

## 2016-12-30 ENCOUNTER — Ambulatory Visit (INDEPENDENT_AMBULATORY_CARE_PROVIDER_SITE_OTHER): Payer: 59 | Admitting: Neurology

## 2016-12-30 ENCOUNTER — Telehealth: Payer: Self-pay | Admitting: Neurology

## 2016-12-30 VITALS — BP 136/93 | HR 107 | Wt 167.0 lb

## 2016-12-30 DIAGNOSIS — F4323 Adjustment disorder with mixed anxiety and depressed mood: Secondary | ICD-10-CM | POA: Diagnosis not present

## 2016-12-30 DIAGNOSIS — I10 Essential (primary) hypertension: Secondary | ICD-10-CM | POA: Diagnosis not present

## 2016-12-30 DIAGNOSIS — G43711 Chronic migraine without aura, intractable, with status migrainosus: Secondary | ICD-10-CM

## 2016-12-30 DIAGNOSIS — Q969 Turner's syndrome, unspecified: Secondary | ICD-10-CM

## 2016-12-30 DIAGNOSIS — G43101 Migraine with aura, not intractable, with status migrainosus: Secondary | ICD-10-CM

## 2016-12-30 MED ORDER — CALCITONIN (SALMON) 200 UNIT/ACT NA SOLN: 1.0000 | Freq: Every day | NASAL | 11 refills | Status: DC

## 2016-12-30 MED ORDER — LEVOTHYROXINE SODIUM 100 MCG PO TABS: 100.0000 ug | ORAL_TABLET | Freq: Every day | ORAL | 11 refills | Status: DC

## 2016-12-30 MED ORDER — ALBUTEROL SULFATE HFA 108 (90 BASE) MCG/ACT IN AERS: 2.0000 | INHALATION_SPRAY | Freq: Four times a day (QID) | RESPIRATORY_TRACT | 12 refills | Status: AC | PRN

## 2016-12-30 MED ORDER — ESCITALOPRAM OXALATE 10 MG PO TABS: 10.0000 mg | ORAL_TABLET | Freq: Every day | ORAL | 11 refills | Status: DC

## 2016-12-30 MED ORDER — MEDROXYPROGESTERONE ACETATE 10 MG PO TABS: 10.0000 mg | ORAL_TABLET | Freq: Every day | ORAL | 3 refills | Status: DC

## 2016-12-30 MED ORDER — PROMETHAZINE HCL 12.5 MG PO TABS: 12.5000 mg | ORAL_TABLET | Freq: Four times a day (QID) | ORAL | 6 refills | Status: DC | PRN

## 2016-12-30 MED ORDER — NORTRIPTYLINE HCL 50 MG PO CAPS: 50.0000 mg | ORAL_CAPSULE | Freq: Every day | ORAL | 11 refills | Status: DC

## 2016-12-30 MED ORDER — ESTROGENS CONJUGATED 1.25 MG PO TABS: 1.2500 mg | ORAL_TABLET | Freq: Every day | ORAL | 3 refills | Status: DC

## 2016-12-30 MED ORDER — AMLODIPINE BESYLATE 5 MG PO TABS: 5.0000 mg | ORAL_TABLET | Freq: Every day | ORAL | 12 refills | Status: DC

## 2016-12-30 MED ORDER — ONDANSETRON 4 MG PO TBDP: ORAL_TABLET | ORAL | 12 refills | Status: DC

## 2016-12-30 MED ORDER — NARATRIPTAN HCL 2.5 MG PO TABS: ORAL_TABLET | ORAL | 11 refills | Status: DC

## 2016-12-30 MED ORDER — TIZANIDINE HCL 2 MG PO CAPS: 2.0000 mg | ORAL_CAPSULE | Freq: Three times a day (TID) | ORAL | 11 refills | Status: DC | PRN

## 2016-12-30 MED ORDER — FLUTICASONE-SALMETEROL 100-50 MCG/DOSE IN AEPB: 1.0000 | INHALATION_SPRAY | Freq: Two times a day (BID) | RESPIRATORY_TRACT | 3 refills | Status: DC

## 2016-12-30 NOTE — Telephone Encounter (Signed)
Pt needs botox apt

## 2016-12-30 NOTE — Progress Notes (Signed)
[facility]    Provider:  Dr Lucia GaskinsAhern Referring Provider: Sunnie Zimmerman, Natalie, DO Primary Care Physician:  Sunnie Zimmerman, Natalie, DO  CC: Migraines, numbness, facial droop.  Interval history 12/30/2016: Patient is here for follow-up of chronic migraines. Topiramate helped worsen depression. Other medications that would not impact her depression would be Depakote, SNRIs like Cymbalta and Effexor, TCAs or gabapentin. We decided to try nortriptyline. She reported depression and hair loss with the Topamax. Propranolol can cause worsening depression. She is already on Lexapro so hesitate to try Cymbalta or Effexor. Referred her to physical therapy as well as last appointment. Migraines are improved. She takes 9 naratriptan in a month. She has 16 headache days a month and 12 are migrainous. Ongoing for at least a year. They can last up to 2 weeks.  Headaches are on the right side, pounding, throbbing, and behind the eye with nausea, light and sound sensitivity an smell triggers.No medication overuse. Migraines without aura. She is sleeping well. She is managing stress.   Reglan, Benadryl, Toradol., Topiramate, nortriptyline, norvasc, Zanaflex, phenergan  Interval history: Topiramate helped but has worsened depression. Other medications that can be used that will not impact her depression would be depakote, SNRIs (cymbalta, effexor), TCAs, Gabapentin. She states considerable improvement on the topamax. This is why she hasn't wanted to start it. Patient's mother is here provides much information as is the patient has been extremely depressed since starting Topamax. Patient also reports hair loss. Denies any thoughts of hurting herself. Topamax can cause worsening depression. Today we discussed migraine management including all the different classes of medications that we can use. Epilepsy medications, Depakote is a medication that could be used in this particular case, patient has Turner  syndrome and infertility still discussed teratogenicity Depakote. Anti-epilepsy medications may cause bone loss and patient has osteoporosis and she worries about this. I blood pressure medications such as propranolol can cause worsening depression. Could try Cymbalta and Effexor but she is already on Lexapro which I don't want to change and putting her on both risks serotonin syndrome. Tricyclic antidepressant is something that we discussed today, it also increases serotonin but less likely to cause serotonin syndrome. EKG performed in the office showed QTC of 409 which is normal. Discussed serotonin syndrome. Will add nortriptyline which will also help with her insomnia. Had a long discussion about migraine medication, migraine pathophysiology. She also has a lot of neck pain, neck tightness, this can worsen her headaches are cause her headaches.  GNF:AOZHYHPI:Cristina Stringeris a 31 y.o.femalehere as a referral from Dr. Reubin MilanAlexanderfor migraines. Past medical history of hypertension, high cholesterol, migraine, depression, anxiety, hypothyroidism, hyperlipidemia, nonalcoholic fatty liver, Turner's syndrome, asthma, rickets.Started at the age of 89. Mother is here and provides information as well. Mother has migraines. She has been to the emergency room multiple times. No inciting events, they just started at the age of 5, no head trauma. She has had a migraine last up to 2 weeks and went to the ED recently. Slowly worsening, progressive increase in frequency and severity. Only sleep and dark rooms help, recently naratriptan is helping her insurance will pay for it and she now has Imitrex. She has to wear dark glasses at the ofice. Recently she had left arm numbness in association with migraine, numbness, couldn't feel her arm and left facial numbness. She had facial droop with the headache (showed me a picture exhibiting left facial lower weakness). She has been to the ED and it has happened several times in the  last  several months. She often does not have an aura but also has an aura, loses vision, sparkles, shimmering. Her right side has also been numb. Headaches are on the right side, pounding, throbbing, and behind the eye. She has 20 headache days out of the month and at least 1/2 are migrainous for the last 6 monthsand usually last all day if not longer. She has sensitivity to smells, nausea and vomiting. She also experiencedconfusion and memory changes during these episodes. No other focal neurologic deficits or complaints. Mother has migraines.  Reviewed notes, labs and imaging from outside physicians, which showed:   Patient has a history of migraine. Medications tried include: Reglan, Benadryl, Toradol.  BUN 11, creatinine 0.75 these labs drawn August 2017.   Personally reviewed MRI/MRAof the brain and agree with the following: IMPRESSION: 1. Normal MRI of the brain. 2. Normal variant MRA circle of Willis without significant proximal stenosis, aneurysm, or branch vessel occlusion.  CBC normal, CMP with elevated AST and ALT otherwise normal both drawn 11/28/2015.  Review of Systems: Patient complains of symptoms per HPI as well as the following symptoms: Fatigue, blurred vision, eye pain, flushing, ringing in ears, spinning sensation, allergies, confusion, headache, numbness, weakness, slurred speech, dizziness, sleepiness, restless legs, depression, anxiety, not enough sleep, decreased energy. Pertinent negatives per HPI. All others negative.   Social History   Social History  . Marital status: Single    Spouse name: N/A  . Number of children: N/A  . Years of education: N/A   Occupational History  . Not on file.   Social History Main Topics  . Smoking status: Never Smoker  . Smokeless tobacco: Never Used  . Alcohol use Yes     Comment: socially  . Drug use: No  . Sexual activity: Not on file   Other Topics Concern  . Not on file   Social History Narrative   Lives    Caffeine use:     Family History  Problem Relation Age of Onset  . Hypertension Mother   . Arthritis Mother   . Fibromyalgia Mother   . Hyperlipidemia Mother   . Diabetes Father   . Diabetes Paternal Grandmother   . Stroke Paternal Grandmother   . Kidney disease Paternal Grandmother   . Heart attack Paternal Grandmother   . Hypertension Paternal Grandmother   . Hyperlipidemia Paternal Grandmother   . Diabetes Paternal Grandfather   . Alcohol abuse Maternal Grandfather   . Cancer Maternal Grandfather        BRAIN    Past Medical History:  Diagnosis Date  . Allergy   . Anxiety   . Hematuria   . Hip dysplasia   . Hyperlipidemia   . Hypertension   . Migraine   . NASH (nonalcoholic steatohepatitis)   . Osteoporosis   . Scoliosis    minimal  . Thyroid disease   . Turner syndrome   . Vitamin D deficiency     Past Surgical History:  Procedure Laterality Date  . BILATERAL OOPHORECTOMY    . HERNIA REPAIR     x2  . HIP SURGERY    . KYPHOPLASTY  08/2016   L2  . TONSILLECTOMY    . TYMPANOSTOMY TUBE PLACEMENT      Current Outpatient Prescriptions  Medication Sig Dispense Refill  . albuterol (PROVENTIL HFA;VENTOLIN HFA) 108 (90 Base) MCG/ACT inhaler Inhale 2 puffs into the lungs every 6 (six) hours as needed for wheezing. 1 Inhaler 0  . amLODipine (NORVASC) 5  MG tablet Take 1 tablet (5 mg total) by mouth daily. NEED FOLLOW UP APPOINTMENT FOR MORE REFILLS 30 tablet 0  . calcitonin, salmon, (MIACALCIN) 200 UNIT/ACT nasal spray Place 1 spray into alternate nostrils daily. 3.7 mL 11  . escitalopram (LEXAPRO) 10 MG tablet Take 1 tablet (10 mg total) by mouth daily. NO MORE REFILLS WITHOUT APPOINTMENT 30 tablet 0  . estrogens, conjugated, (PREMARIN) 1.25 MG tablet Take 1 tablet (1.25 mg total) by mouth daily. 90 tablet 1  . fexofenadine (ALLEGRA) 60 MG tablet Take 60 mg by mouth daily.      . fluticasone (FLONASE) 50 MCG/ACT nasal spray Place two sprays in each nostril once  daily 16 g 1  . Fluticasone-Salmeterol (ADVAIR DISKUS) 100-50 MCG/DOSE AEPB Inhale 1 puff into the lungs 2 (two) times daily. Inhale 1 puff into lungs bid 60 each 3  . HYDROcodone-acetaminophen (NORCO/VICODIN) 5-325 MG tablet Take 1 tablet by mouth every 6 (six) hours as needed. 20 tablet 0  . levothyroxine (SYNTHROID, LEVOTHROID) 100 MCG tablet Take 1 tablet (100 mcg total) by mouth daily before breakfast. APPT REQUIRED FOR REFILLS. 30 tablet 0  . medroxyPROGESTERone (PROVERA) 10 MG tablet Take 1 tablet (10 mg total) by mouth daily. 10 days per month 30 tablet 1  . Multiple Vitamin (MULTIVITAMIN) tablet Take 1 tablet by mouth daily.      . naratriptan (AMERGE) 2.5 MG tablet TAKE 1 TABLET AS NEEDED MIGRAINE MAY REPEAT AFTER 4 HOURS MAX 2 PER 24 HOURS needs f/u appt before future refills 20 tablet 1  . nortriptyline (PAMELOR) 10 MG capsule Take 2 capsules (20 mg total) by mouth at bedtime. 60 capsule 6  . ondansetron (ZOFRAN ODT) 4 MG disintegrating tablet Take 1-2 tablets by mouth every 8 (eight) hours as needed for nausea or vomiting. 30 tablet 12  . promethazine (PHENERGAN) 12.5 MG tablet Take 1 tablet (12.5 mg total) by mouth every 6 (six) hours as needed for nausea or vomiting. 30 tablet 6  . tizanidine (ZANAFLEX) 2 MG capsule Take 2 mg by mouth 3 (three) times daily as needed for muscle spasms.     No current facility-administered medications for this visit.     Allergies as of 12/30/2016 - Review Complete 12/30/2016  Allergen Reaction Noted  . Prednisone  07/22/2012    Vitals: There were no vitals taken for this visit. Last Weight:  Wt Readings from Last 1 Encounters:  08/29/16 172 lb (78 kg)   Last Height:   Ht Readings from Last 1 Encounters:  08/18/16  (1.575 m)   Physical exam: Exam: Gen: NAD, conversant, well nourised, obese, well groomed                     CV: RRR, no MRG. No Carotid Bruits. No peripheral edema, warm, nontender Eyes: Conjunctivae clear without  exudates or hemorrhage  Neuro: Detailed Neurologic Exam  Speech:    Speech is normal; fluent and spontaneous with normal comprehension.  Cognition:    The patient is oriented to person, place, and time;     recent and remote memory intact;     language fluent;     normal attention, concentration,     fund of knowledge Cranial Nerves:    The pupils are equal, round, and reactive to light. The fundi are normal and spontaneous venous pulsations are present. Visual fields are full to finger confrontation. Extraocular movements are intact. Trigeminal sensation is intact and the muscles of mastication are normal.  The face is symmetric. The palate elevates in the midline. Hearing intact. Voice is normal. Shoulder shrug is normal. The tongue has normal motion without fasciculations.   Coordination:    Normal finger to nose and heel to shin. Normal rapid alternating movements.   Gait:    Heel-toe and tandem gait are normal.   Motor Observation:    No asymmetry, no atrophy, and no involuntary movements noted. Tone:    Normal muscle tone.    Posture:    Posture is normal. normal erect    Strength:    Strength is V/V in the upper and lower limbs.      Sensation: intact to LT        Assessment/Plan:31 year old female with chronic migraines without aura with status migrainosus not intractable. Discussed next steps including increasing nortriptyline, starting another preventative, Botox for migraine or the new CGRP medications, discussed pros and cons in detail.   As far as your medications are concerned, I would like to suggest: increase Nortriptyline  at night  Will initiate Botox approval   Migraine and musculoskeletal neck pain with cervicalgia, stress and depression: [facility] (PT, massage, stress feedback and acupuncture): she hurt her back and could not go.   Discussed side effect of medications as per patient instructions. Discussed teratogenicity of  medications.  Naomie Dean, MD  [facility] [address] [place], [place] [address]  Phone [phone] Fax [phone]  A total of 25 minutes was spent face-to-face with this patient. Over half this time was spent on counseling patient on the chronic migraine diagnosis and different diagnostic and therapeutic options available.

## 2017-01-04 NOTE — Telephone Encounter (Signed)
error 

## 2017-01-07 ENCOUNTER — Telehealth: Payer: Self-pay | Admitting: Neurology

## 2017-01-07 NOTE — Telephone Encounter (Signed)
I called to scheduled patient but she did not answer and her VM was full.

## 2017-01-12 ENCOUNTER — Telehealth: Payer: Self-pay | Admitting: Neurology

## 2017-01-12 NOTE — Telephone Encounter (Signed)
I called the patient to schedule botox injections, her VM was full I could not leave a message.

## 2017-01-26 ENCOUNTER — Other Ambulatory Visit: Payer: Self-pay | Admitting: Neurology

## 2017-01-27 ENCOUNTER — Other Ambulatory Visit: Payer: Self-pay | Admitting: Osteopathic Medicine

## 2017-02-18 ENCOUNTER — Telehealth: Payer: Self-pay | Admitting: Neurology

## 2017-02-18 NOTE — Telephone Encounter (Signed)
I called patient to schedule her for an injection. She did not answer so I left a VM asking her to call me back.

## 2017-02-21 ENCOUNTER — Other Ambulatory Visit: Payer: Self-pay | Admitting: Osteopathic Medicine

## 2017-02-25 ENCOUNTER — Telehealth: Payer: Self-pay | Admitting: *Deleted

## 2017-02-25 NOTE — Telephone Encounter (Signed)
Called and LVM (ok per DPR) informing patient that per Dr. Lucia GaskinsAhern, we will not refill Premarin or other additional meds unrelated to why she sees neurologist (headache). I informed her that either OBGYN, endocrinologist, or PCP should refill. I gave her our office number in case she had any questions and told her that we had notified the pharmacy.

## 2017-06-01 ENCOUNTER — Other Ambulatory Visit: Payer: Self-pay | Admitting: *Deleted

## 2017-06-01 MED ORDER — NORTRIPTYLINE HCL 50 MG PO CAPS: 50.0000 mg | ORAL_CAPSULE | Freq: Every day | ORAL | 2 refills | Status: DC

## 2017-07-01 ENCOUNTER — Other Ambulatory Visit: Payer: Self-pay | Admitting: *Deleted

## 2017-07-01 DIAGNOSIS — F4323 Adjustment disorder with mixed anxiety and depressed mood: Secondary | ICD-10-CM

## 2017-07-01 DIAGNOSIS — I10 Essential (primary) hypertension: Secondary | ICD-10-CM

## 2017-07-01 MED ORDER — AMLODIPINE BESYLATE 5 MG PO TABS: 5.0000 mg | ORAL_TABLET | Freq: Every day | ORAL | 1 refills | Status: DC

## 2017-07-01 MED ORDER — ESCITALOPRAM OXALATE 10 MG PO TABS: 10.0000 mg | ORAL_TABLET | Freq: Every day | ORAL | 1 refills | Status: DC

## 2017-07-01 NOTE — Telephone Encounter (Signed)
Escitalopram 10 mg prescription changed to 90 day supply for remainder of previously written year supply. This was requested by the pt's pharmacy.

## 2017-07-01 NOTE — Telephone Encounter (Addendum)
Amlodipine 5 mg changed to 90 day supply for remainder previously written year long prescription. The 90 day supply requested by pharmacy.

## 2017-09-25 ENCOUNTER — Encounter: Payer: Self-pay | Admitting: Family Medicine

## 2017-09-25 ENCOUNTER — Ambulatory Visit (INDEPENDENT_AMBULATORY_CARE_PROVIDER_SITE_OTHER): Payer: 59 | Admitting: Family Medicine

## 2017-09-25 VITALS — BP 122/74 | HR 94 | Wt 169.0 lb

## 2017-09-25 DIAGNOSIS — K7581 Nonalcoholic steatohepatitis (NASH): Secondary | ICD-10-CM

## 2017-09-25 DIAGNOSIS — E039 Hypothyroidism, unspecified: Secondary | ICD-10-CM

## 2017-09-25 DIAGNOSIS — Q969 Turner's syndrome, unspecified: Secondary | ICD-10-CM

## 2017-09-25 DIAGNOSIS — M8080XS Other osteoporosis with current pathological fracture, unspecified site, sequela: Secondary | ICD-10-CM | POA: Diagnosis not present

## 2017-09-25 DIAGNOSIS — R1110 Vomiting, unspecified: Secondary | ICD-10-CM | POA: Diagnosis not present

## 2017-09-25 DIAGNOSIS — R1013 Epigastric pain: Secondary | ICD-10-CM

## 2017-09-25 DIAGNOSIS — E7849 Other hyperlipidemia: Secondary | ICD-10-CM | POA: Diagnosis not present

## 2017-09-25 MED ORDER — ATORVASTATIN CALCIUM 80 MG PO TABS: 80.0000 mg | ORAL_TABLET | Freq: Every day | ORAL | 0 refills | Status: DC

## 2017-09-25 MED ORDER — PROGESTERONE MICRONIZED 200 MG PO CAPS: ORAL_CAPSULE | ORAL | 1 refills | Status: DC

## 2017-09-25 MED ORDER — OMEPRAZOLE 40 MG PO CPDR: 40.0000 mg | DELAYED_RELEASE_CAPSULE | Freq: Two times a day (BID) | ORAL | 3 refills | Status: DC

## 2017-09-25 MED ORDER — SUCRALFATE 1 G PO TABS
1.0000 g | ORAL_TABLET | Freq: Four times a day (QID) | ORAL | 0 refills | Status: DC
Start: 2017-09-25 — End: 2018-03-10

## 2017-09-25 MED ORDER — LEVOTHYROXINE SODIUM 100 MCG PO TABS: 100.0000 ug | ORAL_TABLET | Freq: Every day | ORAL | 0 refills | Status: DC

## 2017-09-25 MED ORDER — ESTRADIOL 0.1 MG/24HR TD PTWK: 0.1000 mg | MEDICATED_PATCH | TRANSDERMAL | 12 refills | Status: DC

## 2017-09-25 NOTE — Progress Notes (Signed)
Cristina Zimmerman is a 32 y.o. female who presents to [facility] Cristina Zimmerman: Primary Care Sports Medicine today for epigastric abdominal pain and vomiting.  Symptoms have been occurring intermittently over the past week or so.  Patient notes her symptoms are associated with acid reflux and regurgitation.  She notes though occur at bedtime and around meals.  She notes occasional diarrhea.  She denies any blood in stool or vomit.  She notes mild epigastric abdominal pain occurring with the vomiting and diarrhea.  She notes cramping bloating and gas as well.  She is tried some over-the-counter Tums which have not helped much at all.  She notes a pertinent medical history for nonalcoholic steatohepatitis.  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  I previously seen her for a vertebral compression fracture and during that visit diagnosed osteoporosis.  At that time she was not on hormone replacement therapy.  She was prescribed estrogen and progesterone but notes that she is had trouble getting it due to her insurance company.  She is done some research and notes that the patch seems to be the preferred agent and would like to try that if possible.  She does continue to have a uterus.  Additionally she notes a pertinent medical history for severe hyperlipidemia.  She had labs performed as part of a wellness screening labs last year and had LDL of 226.  She is been told that her lipids are elevated in the past.  She is not currently taking any medications.  Additionally she notes a history of hypothyroidism.  She has been out of her medications for some time and notes that she is fatigued.  ROS as above:  Exam:  BP 122/74   Pulse 94   Wt 169 lb (76.7 kg)   BMI 30.91 kg/m  Gen: Well NAD nontoxic appearing HEENT: EOMI,  MMM Lungs: Normal work of breathing. CTABL Heart: RRR no MRG Abd: NABS, Soft. Nondistended,  mildly tender epigastric area.  Mildly tender right upper quadrant.  Mildly positive Murphy sign.  No rebound or guarding. Exts: Brisk capillary refill, warm and well perfused.   Lab and Radiology Results  Lipid panel reviewed from Sep 17, 2016 POCT Lipid Panel (cholesterol)Resulted: 09/17/2016 4:22 PM [facility] Component Name Value Ref Range  POC TOTAL CHOLESTEROL  321 (A) 200 mg/dL  POC HDL 39 (A) 40 - 60 mg/dL  POC LDL CHOLESTEROL 098 (A) 100 mg/dL  POC TC/HDL-C RATIO 8.2 (A) 4.5   POC TRIGLYCERIDES 279 (A) 150 mg/dL  INTERNAL CONTROLS VALID Yes--Test working appropriately    Expiration Date 02/18/2017    Lot Number [id number]    Specimen Collected on  Other 09/17/2016 4:22 PM      Assessment and Plan: 32 y.o. female with  Epigastric abdominal pain nausea vomiting and diarrhea; this is associated with acid reflux.  Differential includes gastritis pancreatitis cholelithiasis.  Differential also includes IBS.  Plan for empiric treatment with omeprazole and Carafate for presumed gastritis.  Check H. pylori breath test now CBC CMP and lipase.  Turner syndrome with history of osteoporosis with vertebral compression fracture: Patient is in significant need for hormone replacement therapy for her osteoporosis and Turner syndrome.  Plan to start estrogen patch and cycling progesterone.  This is per guidelines.  Follow-up with PCP in 1 month.  We may have to do a prior authorization but I advised patient to stick with that as I think we can get these  medications approved in her condition.  Familial hyperlipidemia: Patient has severely elevated LDL at 226 at last check.  She is at high risk for MACE events.  Plan to start high-dose atorvastatin.  Recheck lipids in about 6 weeks.  Hypothyroidism: Restart levothyroxine and recheck TSH in about 6 weeks.  Transition care back to PCP follow-up with PCP in about a month.   Orders Placed This Encounter  Procedures  . CBC with  Differential/Platelet  . COMPLETE METABOLIC PANEL WITH GFR  . Lipase  . H. pylori breath test   Meds ordered this encounter  Medications  . estradiol (CLIMARA - DOSED IN MG/24 HR) 0.1 mg/24hr patch    Sig: Place 1 patch (0.1 mg total) onto the skin once a week.    Dispense:  4 patch    Refill:  12    Turners syndrome  . progesterone (PROMETRIUM) 200 MG capsule    Sig: Take daiily for 12 days per month with estrogen patch for turner syndrome    Dispense:  36 capsule    Refill:  1  . omeprazole (PRILOSEC) 40 MG capsule    Sig: Take 1 capsule (40 mg total) by mouth 2 (two) times daily.    Dispense:  60 capsule    Refill:  3  . sucralfate (CARAFATE) 1 g tablet    Sig: Take 1 tablet (1 g total) by mouth 4 (four) times daily.    Dispense:  120 tablet    Refill:  0  . levothyroxine (SYNTHROID, LEVOTHROID) 100 MCG tablet    Sig: Take 1 tablet (100 mcg total) by mouth daily before breakfast.    Dispense:  90 tablet    Refill:  0  . atorvastatin (LIPITOR) 80 MG tablet    Sig: Take 1 tablet (80 mg total) by mouth daily.    Dispense:  90 tablet    Refill:  0     Historical information moved to improve visibility of documentation.  Past Medical History:  Diagnosis Date  . Allergy   . Anxiety   . Hematuria   . Hip dysplasia   . Hyperlipidemia   . Hypertension   . Migraine   . NASH (nonalcoholic steatohepatitis)   . Osteoporosis   . Scoliosis    minimal  . Thyroid disease   . Turner syndrome   . Vitamin D deficiency    Past Surgical History:  Procedure Laterality Date  . BILATERAL OOPHORECTOMY    . HERNIA REPAIR     x2  . HIP SURGERY    . KYPHOPLASTY  08/2016   L2  . TONSILLECTOMY    . TYMPANOSTOMY TUBE PLACEMENT     Social History   Tobacco Use  . Smoking status: Never Smoker  . Smokeless tobacco: Never Used  Substance Use Topics  . Alcohol use: Yes    Comment: socially   family history includes Alcohol abuse in her maternal grandfather; Arthritis in her  mother; Cancer in her maternal grandfather; Diabetes in her father, paternal grandfather, and paternal grandmother; Fibromyalgia in her mother; Heart attack in her paternal grandmother; Hyperlipidemia in her mother and paternal grandmother; Hypertension in her mother and paternal grandmother; Kidney disease in her paternal grandmother; Stroke in her paternal grandmother.  Medications: Current Outpatient Medications  Medication Sig Dispense Refill  . albuterol (PROVENTIL HFA;VENTOLIN HFA) 108 (90 Base) MCG/ACT inhaler Inhale 2 puffs into the lungs every 6 (six) hours as needed for wheezing. 1 Inhaler 12  . amLODipine (NORVASC) 5 MG  tablet Take 1 tablet (5 mg total) by mouth daily. 90 tablet 1  . escitalopram (LEXAPRO) 10 MG tablet Take 1 tablet (10 mg total) by mouth daily. 90 tablet 1  . fexofenadine (ALLEGRA) 60 MG tablet Take 60 mg by mouth daily.      . Fluticasone-Salmeterol (ADVAIR DISKUS) 100-50 MCG/DOSE AEPB Inhale 1 puff into the lungs 2 (two) times daily. Inhale 1 puff into lungs bid 60 each 3  . levothyroxine (SYNTHROID, LEVOTHROID) 100 MCG tablet Take 1 tablet (100 mcg total) by mouth daily before breakfast. 90 tablet 0  . Multiple Vitamin (MULTIVITAMIN) tablet Take 1 tablet by mouth daily.      . naratriptan (AMERGE) 2.5 MG tablet TAKE 1 TABLET AS NEEDED MIGRAINE MAY REPEAT AFTER 2 HOURS MAX 2 PER 24 HOURS 12 tablet 11  . nortriptyline (PAMELOR) 50 MG capsule Take 1 capsule (50 mg total) by mouth at bedtime. 90 capsule 2  . ondansetron (ZOFRAN ODT) 4 MG disintegrating tablet Take 1-2 tablets by mouth every 8 (eight) hours as needed for nausea or vomiting. 30 tablet 12  . promethazine (PHENERGAN) 12.5 MG tablet Take 1 tablet (12.5 mg total) by mouth every 6 (six) hours as needed for nausea or vomiting. 30 tablet 6  . tizanidine (ZANAFLEX) 2 MG capsule Take 1 capsule (2 mg total) by mouth 3 (three) times daily as needed for muscle spasms. 60 capsule 11  . atorvastatin (LIPITOR) 80 MG  tablet Take 1 tablet (80 mg total) by mouth daily. 90 tablet 0  . estradiol (CLIMARA - DOSED IN MG/24 HR) 0.1 mg/24hr patch Place 1 patch (0.1 mg total) onto the skin once a week. 4 patch 12  . omeprazole (PRILOSEC) 40 MG capsule Take 1 capsule (40 mg total) by mouth 2 (two) times daily. 60 capsule 3  . progesterone (PROMETRIUM) 200 MG capsule Take daiily for 12 days per month with estrogen patch for turner syndrome 36 capsule 1  . sucralfate (CARAFATE) 1 g tablet Take 1 tablet (1 g total) by mouth 4 (four) times daily. 120 tablet 0   No current facility-administered medications for this visit.    Allergies  Allergen Reactions  . Prednisone     PO prednisone only- Jittery, anxious, elevated blood pressure.   Pt does "okay" with steroid injections    Health Maintenance Health Maintenance  Topic Date Due  . HIV Screening  03/29/1999  . TETANUS/TDAP  03/29/2003  . PAP SMEAR  07/24/2013  . INFLUENZA VACCINE  11/19/2017    Discussed warning signs or symptoms. Please see discharge instructions. Patient expresses understanding.

## 2017-09-25 NOTE — Patient Instructions (Addendum)
Thank you for coming in today. Please schedule a follow up appointment with Dr Lyn HollingsheadAlexander in a few weeks.  If you cannot see her please see me.  Get labs now.  Start omprazole twice daily for 1 month.  Use carafate 4x daily for 1 week.  Restart thyroid medicine Start Lipitor in a week or so when feeling better.  Start estrogen patch and progresterone pills for Turner and bone density.   I expect that we may have to prove that you need the estrogen patch.  Keep working with us with this. Dont give up.    Gastritis, Adult Gastritis is inflammation of the stomach. There are two kinds of gastritis:  Acute gastritis. This kind develops suddenly.  Chronic gastritis. This kind lasts for a long time.  Gastritis happens when the lining of the stomach becomes weak or gets damaged. Without treatment, gastritis can lead to stomach bleeding and ulcers. What are the causes? This condition may be caused by:  An infection.  Drinking too much alcohol.  Certain medicines.  Having too much acid in the stomach.  A disease of the intestines or stomach.  Stress.  What are the signs or symptoms? Symptoms of this condition include:  Pain or a burning in the upper abdomen.  Nausea.  Vomiting.  An uncomfortable feeling of fullness after eating.  In some cases, there are no symptoms. How is this diagnosed? This condition may be diagnosed with:  A description of your symptoms.  A physical exam.  Tests. These can include: ? Blood tests. ? Stool tests. ? A test in which a thin, flexible instrument with a light and camera on the end is passed down the esophagus and into the stomach (upper endoscopy). ? A test in which a sample of tissue is taken for testing (biopsy).  How is this treated? This condition may be treated with medicines. If the condition is caused by a bacterial infection, you may be given antibiotic medicines. If it is caused by too much acid in the stomach, you may get  medicines called H2 blockers, proton pump inhibitors, or antacids. Treatment may also involve stopping the use of certain medicines, such as aspirin, ibuprofen, or other nonsteroidal anti-inflammatory drugs (NSAIDs). Follow these instructions at home:  Take over-the-counter and prescription medicines only as told by your health care provider.  If you were prescribed an antibiotic, take it as told by your health care provider. Do not stop taking the antibiotic even if you start to feel better.  Drink enough fluid to keep your urine clear or pale yellow.  Eat small, frequent meals instead of large meals. Contact a health care provider if:  Your symptoms get worse.  Your symptoms return after treatment. Get help right away if:  You vomit blood or material that looks like coffee grounds.  You have black or dark red stools.  You are unable to keep fluids down.  Your abdominal pain gets worse.  You have a fever.  You do not feel better after 1 week. This information is not intended to replace advice given to you by your health care provider. Make sure you discuss any questions you have with your health care provider. Document Released: 04/01/2001 Document Revised: 12/05/2015 Document Reviewed: 12/30/2014 Elsevier Interactive Patient Education  [employer]2018 [employer]

## 2017-09-26 LAB — CBC WITH DIFFERENTIAL/PLATELET
BASOS PCT: 1 %
Basophils Absolute: 57 cells/uL (ref 0–200)
EOS ABS: 108 {cells}/uL (ref 15–500)
Eosinophils Relative: 1.9 %
HCT: 41.2 % (ref 35.0–45.0)
Hemoglobin: 14 g/dL (ref 11.7–15.5)
Lymphs Abs: 2582 cells/uL (ref 850–3900)
MCH: 30 pg (ref 27.0–33.0)
MCHC: 34 g/dL (ref 32.0–36.0)
MCV: 88.2 fL (ref 80.0–100.0)
MONOS PCT: 7.5 %
MPV: 9.4 fL (ref 7.5–12.5)
NEUTROS PCT: 44.3 %
Neutro Abs: 2525 cells/uL (ref 1500–7800)
PLATELETS: 234 10*3/uL (ref 140–400)
RBC: 4.67 10*6/uL (ref 3.80–5.10)
RDW: 13.8 % (ref 11.0–15.0)
TOTAL LYMPHOCYTE: 45.3 %
WBC mixed population: 428 cells/uL (ref 200–950)
WBC: 5.7 10*3/uL (ref 3.8–10.8)

## 2017-09-26 LAB — COMPLETE METABOLIC PANEL WITH GFR
AG Ratio: 2 (calc) (ref 1.0–2.5)
ALT: 41 U/L — AB (ref 6–29)
AST: 28 U/L (ref 10–30)
Albumin: 4.6 g/dL (ref 3.6–5.1)
Alkaline phosphatase (APISO): 86 U/L (ref 33–115)
BUN: 10 mg/dL (ref 7–25)
CALCIUM: 9.2 mg/dL (ref 8.6–10.2)
CHLORIDE: 105 mmol/L (ref 98–110)
CO2: 26 mmol/L (ref 20–32)
Creat: 0.99 mg/dL (ref 0.50–1.10)
GFR, EST AFRICAN AMERICAN: 87 mL/min/{1.73_m2} (ref >=60)
GFR, Est Non African American: 75 mL/min/{1.73_m2} (ref >=60)
GLUCOSE: 95 mg/dL (ref 65–139)
Globulin: 2.3 g/dL (calc) (ref 1.9–3.7)
Potassium: 3.7 mmol/L (ref 3.5–5.3)
Sodium: 140 mmol/L (ref 135–146)
TOTAL PROTEIN: 6.9 g/dL (ref 6.1–8.1)
Total Bilirubin: 0.4 mg/dL (ref 0.2–1.2)

## 2017-09-26 LAB — LIPASE: Lipase: 77 U/L — ABNORMAL HIGH (ref 7–60)

## 2017-09-28 LAB — H. PYLORI BREATH TEST: H. pylori Breath Test: NOT DETECTED

## 2017-09-30 ENCOUNTER — Encounter: Payer: Self-pay | Admitting: Osteopathic Medicine

## 2017-10-03 IMAGING — DX DG LUMBAR SPINE COMPLETE 4+V
5 series · 5 of 5 positions shown · non-contrast
Comparison: 09/01/2011 lumbar spine radiographs.

CLINICAL DATA: Low back pain.  No reported injury.

EXAM:
LUMBAR SPINE - COMPLETE 4+ VIEW

[l-spine ap]
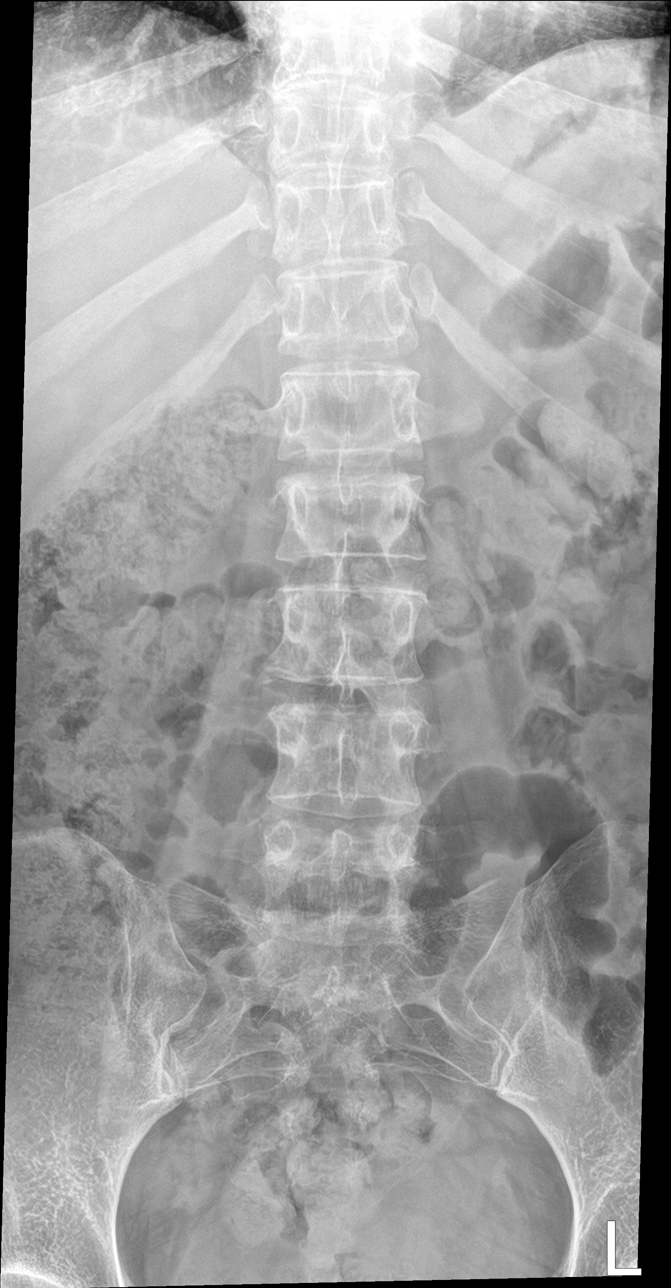

[l-spine obl (1 of 2)]
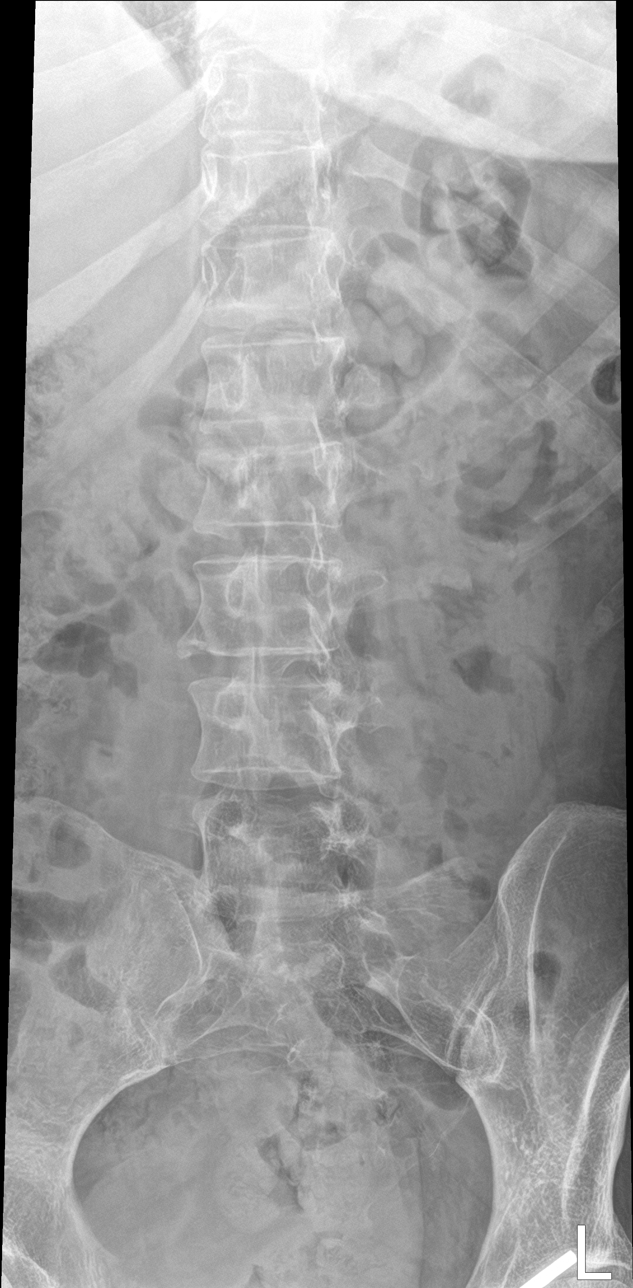

[l-spine obl (2 of 2)]
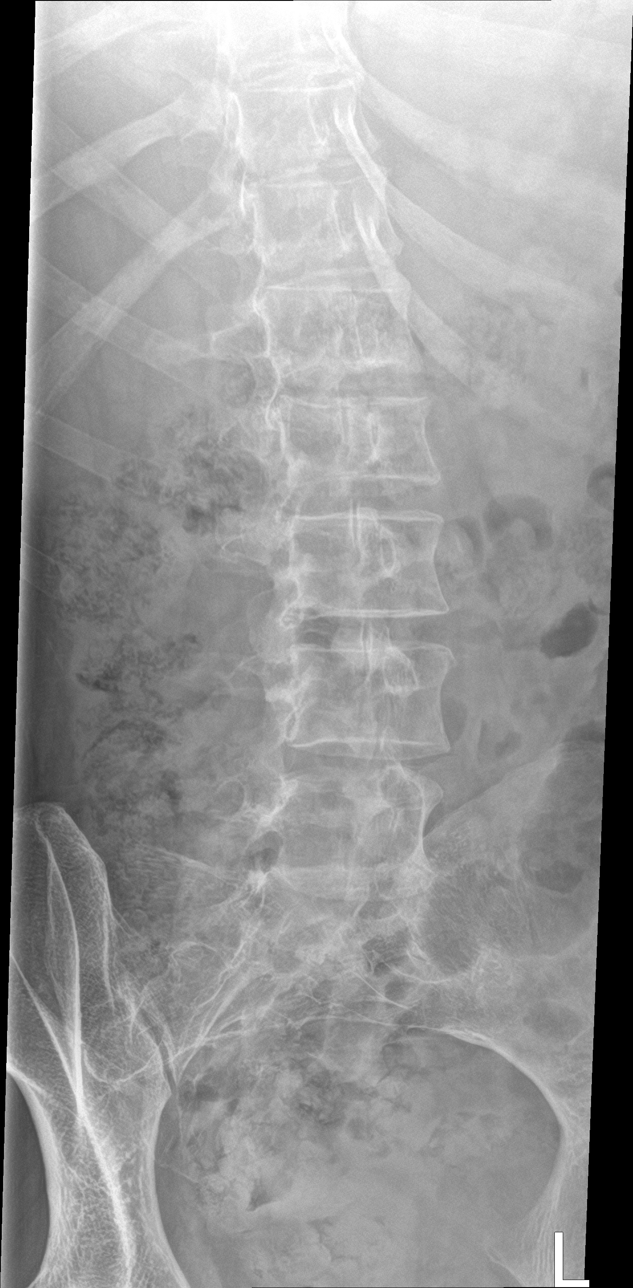

[l-spine lat]
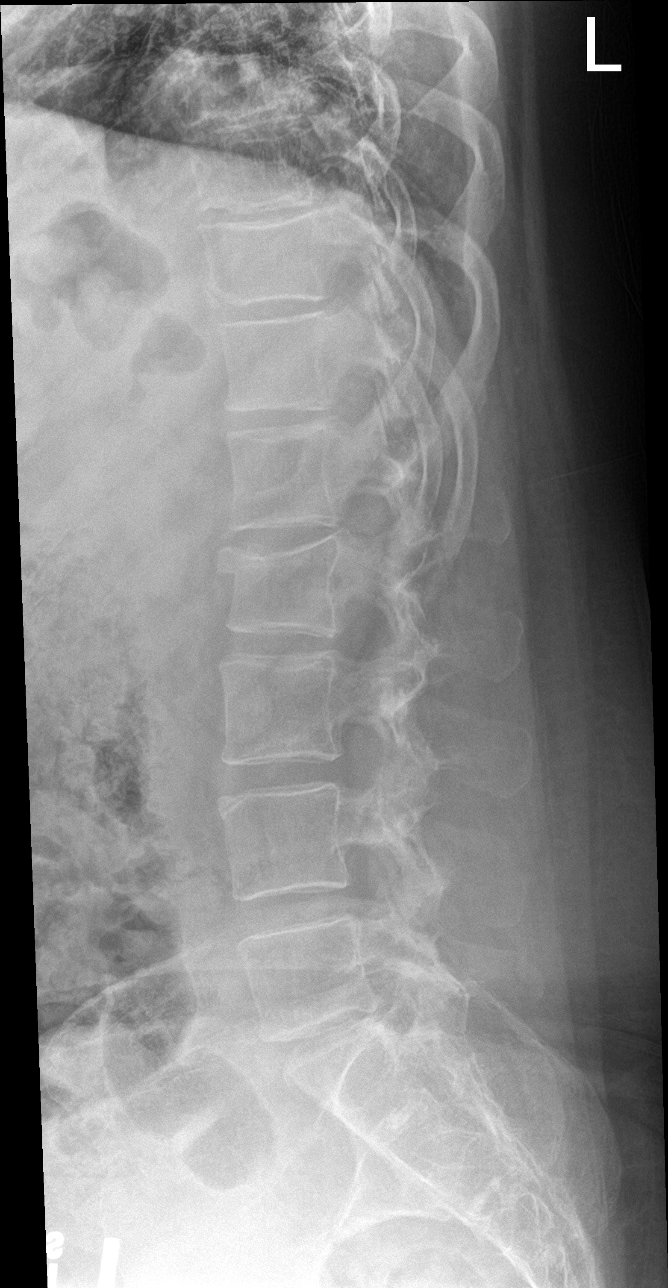

[l-spine spot]
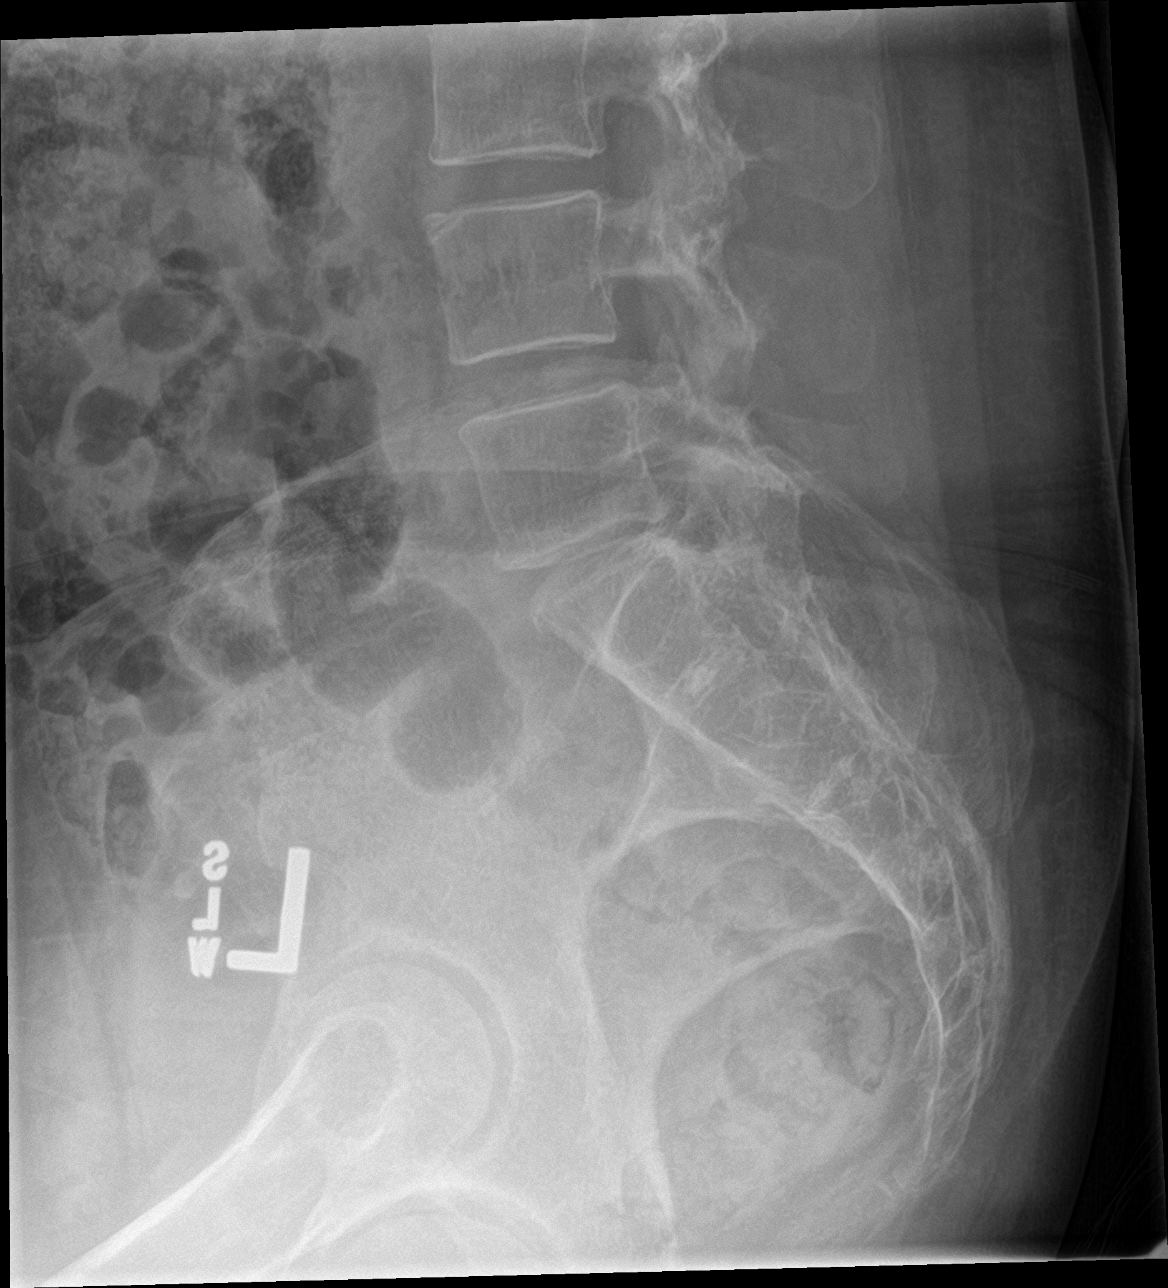

[5 of 5 positions shown; findings below may reference images not displayed]

FINDINGS: This report assumes 5 non rib-bearing lumbar vertebrae.

There is a mild anterior superior L2 vertebral compression fracture
of indeterminate chronicity, new since 09/01/2011. There are stable
limbus vertebrae at the anterior inferior T11 and anterior superior
L4 vertebral bodies. Remaining vertebral body heights are preserved
with no additional fractures. Straightening of the lumbar spine.

Mild degenerative disc disease at L1-2 and L5-S1 with interval
progression at these levels. No spondylolisthesis. No significant
facet arthropathy. No aggressive appearing focal osseous lesions.
IMPRESSION: 1. Mild L2 vertebral compression fracture of indeterminate
chronicity, new since 09/01/2011.
[DATE]. No spondylolisthesis.
3. Straightening of the lumbar spine, usually due to positioning
and/or muscle spasm .
4. Mild multilevel degenerative disc disease in the lumbar spine
with interval progression.

## 2017-10-23 ENCOUNTER — Ambulatory Visit: Payer: 59 | Admitting: Osteopathic Medicine

## 2017-10-23 ENCOUNTER — Telehealth: Payer: Self-pay | Admitting: Osteopathic Medicine

## 2017-10-23 NOTE — Telephone Encounter (Signed)
Patient called at 903 this morning to cancel.

## 2017-12-18 ENCOUNTER — Telehealth: Payer: Self-pay

## 2017-12-18 ENCOUNTER — Other Ambulatory Visit: Payer: Self-pay

## 2017-12-18 DIAGNOSIS — E039 Hypothyroidism, unspecified: Secondary | ICD-10-CM

## 2017-12-18 MED ORDER — LEVOTHYROXINE SODIUM 100 MCG PO TABS: 100.0000 ug | ORAL_TABLET | Freq: Every day | ORAL | 0 refills | Status: DC

## 2017-12-18 NOTE — Telephone Encounter (Signed)
OPENED IN ERROR

## 2018-01-01 ENCOUNTER — Other Ambulatory Visit: Payer: Self-pay | Admitting: Osteopathic Medicine

## 2018-01-01 DIAGNOSIS — E039 Hypothyroidism, unspecified: Secondary | ICD-10-CM

## 2018-01-06 NOTE — Telephone Encounter (Signed)
Per [facility] at [facility], this RX was received.  

## 2018-01-22 ENCOUNTER — Other Ambulatory Visit: Payer: Self-pay | Admitting: Neurology

## 2018-01-22 DIAGNOSIS — F4323 Adjustment disorder with mixed anxiety and depressed mood: Secondary | ICD-10-CM

## 2018-02-21 ENCOUNTER — Emergency Department (INDEPENDENT_AMBULATORY_CARE_PROVIDER_SITE_OTHER)
Admission: EM | Admit: 2018-02-21 | Discharge: 2018-02-21 | Disposition: A | Discharge status: Home / Self Care | Payer: 59 | Source: Home / Self Care | Attending: Family Medicine | Admitting: Family Medicine

## 2018-02-21 ENCOUNTER — Encounter: Payer: Self-pay | Admitting: Emergency Medicine

## 2018-02-21 DIAGNOSIS — H01004 Unspecified blepharitis left upper eyelid: Secondary | ICD-10-CM

## 2018-02-21 DIAGNOSIS — H6691 Otitis media, unspecified, right ear: Secondary | ICD-10-CM | POA: Diagnosis not present

## 2018-02-21 MED ORDER — AMOXICILLIN-POT CLAVULANATE 875-125 MG PO TABS: 1.0000 | ORAL_TABLET | Freq: Two times a day (BID) | ORAL | 0 refills | Status: DC

## 2018-02-21 MED ORDER — ERYTHROMYCIN 5 MG/GM OP OINT: TOPICAL_OINTMENT | Freq: Four times a day (QID) | OPHTHALMIC | 0 refills | Status: AC

## 2018-02-21 NOTE — ED Provider Notes (Signed)
[facility]    CSN: [id number] Arrival date & time: 02/21/18  1122     History   Chief Complaint Chief Complaint  Patient presents with  . Rash    HPI Cristina Zimmerman is a 32 y.o. female.   HPI Cristina Zimmerman is a 32 y.o. female presenting to UC with c/o sinus congestion for 3-4 days associated Right ear fullness as well as Left upper eyelid redness, swelling and itching. She did have Halloween facepaint on the day before symptoms started but denies any other rashes or any symptoms with her Right eye. She has not treid anything for her symptoms. She wears glasses but not contacts. Denies fever, chills, n/v/d. Mild cough from post-nasal drip.   Past Medical History:  Diagnosis Date  . Allergy   . Anxiety   . Hematuria   . Hip dysplasia   . Hyperlipidemia   . Hypertension   . Migraine   . NASH (nonalcoholic steatohepatitis)   . Osteoporosis   . Scoliosis    minimal  . Thyroid disease   . Turner syndrome   . Vitamin D deficiency     Patient Active Problem List   Diagnosis Date Noted  . Vertebral compression fracture, initial encounter 08/22/2016  . Chronic migraine without aura with status migrainosus, not intractable 12/03/2015  . Migraine with aura and with status migrainosus 12/03/2015  . Migraine with aura and without status migrainosus, not intractable 11/22/2015  . Hypothyroidism 07/22/2012  . Childhood asthma 07/20/2012  . Bronchospasm 07/20/2012  . Allergy   . Anxiety   . Turner syndrome   . Hip dysplasia   . Vitamin D deficiency   . Hematuria   . Familial hyperlipidemia, high LDL   . Hypertension   . Osteoporosis   . NASH (nonalcoholic steatohepatitis)   . Scoliosis     Past Surgical History:  Procedure Laterality Date  . BILATERAL OOPHORECTOMY    . HERNIA REPAIR     x2  . HIP SURGERY    . KYPHOPLASTY  08/2016   L2  . TONSILLECTOMY    . TYMPANOSTOMY TUBE PLACEMENT      OB History   None      Home Medications    Prior  to Admission medications   Medication Sig Start Date End Date Taking? Authorizing Provider  albuterol (PROVENTIL HFA;VENTOLIN HFA) 108 (90 Base) MCG/ACT inhaler Inhale 2 puffs into the lungs every 6 (six) hours as needed for wheezing. 12/30/16   Anson Fret, MD  amLODipine (NORVASC) 5 MG tablet Take 1 tablet (5 mg total) by mouth daily. 07/01/17   Anson Fret, MD  amoxicillin-clavulanate (AUGMENTIN) 875-125 MG tablet Take 1 tablet by mouth 2 (two) times daily. One po bid x 7 days 02/21/18   Lurene Shadow, PA-C  atorvastatin (LIPITOR) 80 MG tablet Take 1 tablet (80 mg total) by mouth daily. 09/25/17   Rodolph Bong, MD  erythromycin ophthalmic ointment Place into the left eye 4 (four) times daily for 5 days. Place a 1/2 inch ribbon of ointment into the lower eyelid. 02/21/18 02/26/18  Lurene Shadow, PA-C  escitalopram (LEXAPRO) 10 MG tablet Take 1 tablet (10 mg total) by mouth daily. NO FURTHER REFILLS WITHOUT APPOINTMENT. 01/22/18   Anson Fret, MD  estradiol (CLIMARA - DOSED IN MG/24 HR) 0.1 mg/24hr patch Place 1 patch (0.1 mg total) onto the skin once a week. 09/25/17   Rodolph Bong, MD  fexofenadine (ALLEGRA) 60 MG tablet Take  60 mg by mouth daily.      [provider]  Fluticasone-Salmeterol (ADVAIR DISKUS) 100-50 MCG/DOSE AEPB Inhale 1 puff into the lungs 2 (two) times daily. Inhale 1 puff into lungs bid 12/30/16   Anson Fret, MD  levothyroxine (SYNTHROID, LEVOTHROID) 100 MCG tablet TAKE 1 TABLET BY MOUTH DAILY BEFORE BREAKFAST. NO MORE REFILLS. PT NEEDS LABS AND VISIT WITH PCP. 01/01/18   Sunnie Nielsen, DO  Multiple Vitamin (MULTIVITAMIN) tablet Take 1 tablet by mouth daily.      [provider]  naratriptan (AMERGE) 2.5 MG tablet TAKE 1 TABLET AS NEEDED MIGRAINE MAY REPEAT AFTER 2 HOURS MAX 2 PER 24 HOURS 12/30/16   Anson Fret, MD  nortriptyline (PAMELOR) 50 MG capsule Take 1 capsule (50 mg total) by mouth at bedtime. 06/01/17   Anson Fret, MD    omeprazole (PRILOSEC) 40 MG capsule Take 1 capsule (40 mg total) by mouth 2 (two) times daily. 09/25/17   Rodolph Bong, MD  ondansetron (ZOFRAN ODT) 4 MG disintegrating tablet Take 1-2 tablets by mouth every 8 (eight) hours as needed for nausea or vomiting. 12/30/16   Anson Fret, MD  progesterone (PROMETRIUM) 200 MG capsule Take daiily for 12 days per month with estrogen patch for turner syndrome 09/25/17   Rodolph Bong, MD  promethazine (PHENERGAN) 12.5 MG tablet Take 1 tablet (12.5 mg total) by mouth every 6 (six) hours as needed for nausea or vomiting. 12/30/16   Anson Fret, MD  sucralfate (CARAFATE) 1 g tablet Take 1 tablet (1 g total) by mouth 4 (four) times daily. 09/25/17   Rodolph Bong, MD  tizanidine (ZANAFLEX) 2 MG capsule Take 1 capsule (2 mg total) by mouth 3 (three) times daily as needed for muscle spasms. 12/30/16   Anson Fret, MD    Family History Family History  Problem Relation Age of Onset  . Hypertension Mother   . Arthritis Mother   . Fibromyalgia Mother   . Hyperlipidemia Mother   . Diabetes Father   . Diabetes Paternal Grandmother   . Stroke Paternal Grandmother   . Kidney disease Paternal Grandmother   . Heart attack Paternal Grandmother   . Hypertension Paternal Grandmother   . Hyperlipidemia Paternal Grandmother   . Diabetes Paternal Grandfather   . Alcohol abuse Maternal Grandfather   . Cancer Maternal Grandfather        BRAIN    Social History Social History   Tobacco Use  . Smoking status: Never Smoker  . Smokeless tobacco: Never Used  Substance Use Topics  . Alcohol use: Yes    Comment: socially  . Drug use: No     Allergies   Prednisone   Review of Systems Review of Systems  Constitutional: Negative for chills and fever.  HENT: Positive for congestion, ear pain (Right), postnasal drip and rhinorrhea. Negative for sore throat, trouble swallowing and voice change.   Eyes: Positive for redness (Left upper eyelid) and itching.  Negative for photophobia, pain, discharge and visual disturbance.  Respiratory: Positive for cough. Negative for shortness of breath.   Cardiovascular: Negative for chest pain and palpitations.  Gastrointestinal: Negative for abdominal pain, diarrhea, nausea and vomiting.  Musculoskeletal: Negative for arthralgias, back pain and myalgias.  Skin: Negative for rash.  Neurological: Negative for dizziness and headaches.     Physical Exam Triage Vital Signs ED Triage Vitals  Enc Vitals Group     BP 02/21/18 1151 112/81  Pulse Rate 02/21/18 1151 (!) 117     Resp --      Temp 02/21/18 1151 98.3 F (36.8 C)     Temp Source 02/21/18 1151 Oral     SpO2 02/21/18 1151 100 %     Weight 02/21/18 1152 172 lb 8 oz (78.2 kg)     Height 02/21/18 1152 5\' 2"  (1.575 m)     Head Circumference --      Peak Flow --      Pain Score 02/21/18 1152 0     Pain Loc --      Pain Edu? --      Excl. in GC? --    No data found.  Updated Vital Signs BP 112/81 (BP Location: Right Arm)   Pulse (!) 117   Temp 98.3 F (36.8 C) (Oral)   Ht 5\' 2"  (1.575 m)   Wt 172 lb 8 oz (78.2 kg)   SpO2 100%   BMI 31.55 kg/m   Visual Acuity Right Eye Distance: 20/25 with glasses Left Eye Distance: 20/40 with glasses Bilateral Distance: 20/30 with glasses  Right Eye Near:   Left Eye Near:    Bilateral Near:     Physical Exam  Constitutional: She is oriented to person, place, and time. She appears well-developed and well-nourished.  HENT:  Head: Normocephalic and atraumatic.  Right Ear: Tympanic membrane is erythematous. Tympanic membrane is not bulging. A middle ear effusion is present.  Left Ear: Tympanic membrane normal.  Nose: Nose normal. Right sinus exhibits no maxillary sinus tenderness and no frontal sinus tenderness. Left sinus exhibits no maxillary sinus tenderness and no frontal sinus tenderness.  Mouth/Throat: Uvula is midline, oropharynx is clear and moist and mucous membranes are normal.  Eyes:  Pupils are equal, round, and reactive to light. Conjunctivae and EOM are normal.    Left upper eyelid: erythema with mild edema, mild tenderness. No discharge.   Neck: Normal range of motion. Neck supple.  Cardiovascular: Normal rate and regular rhythm.  Pulmonary/Chest: Effort normal and breath sounds normal. No stridor. No respiratory distress. She has no wheezes. She has no rales.  Musculoskeletal: Normal range of motion.  Neurological: She is alert and oriented to person, place, and time.  Skin: Skin is warm and dry.  Psychiatric: She has a normal mood and affect. Her behavior is normal.  Nursing note and vitals reviewed.    UC Treatments / Results  Labs (all labs ordered are listed, but only abnormal results are displayed) Labs Reviewed - No data to display  EKG None  Radiology No results found.  Procedures Procedures (including critical care time)  Medications Ordered in UC Medications - No data to display  Initial Impression / Assessment and Plan / UC Course  I have reviewed the triage vital signs and the nursing notes.  Pertinent labs & imaging results that were available during my care of the patient were reviewed by me and considered in my medical decision making (see chart for details).    Hx and exam c/w Left upper eyelid blepharitis, likely from the Halloween face paint. Exam also c/w Right AOM Will tx with topical and oral antibiotics Home instructions provided.  Final Clinical Impressions(s) / UC Diagnoses   Final diagnoses:  Right acute otitis media  Blepharitis of left upper eyelid, unspecified type     Discharge Instructions      Please use and take the medications as prescribed. You may find additional information in this packet for home  care of eyelid infections.  Please follow up with family medicine or an eye specialist later this week if not improving.    ED Prescriptions    Medication Sig Dispense Auth. Provider    amoxicillin-clavulanate (AUGMENTIN) 875-125 MG tablet Take 1 tablet by mouth 2 (two) times daily. One po bid x 7 days 14 tablet Waylan Rocher O, PA-C   erythromycin ophthalmic ointment Place into the left eye 4 (four) times daily for 5 days. Place a 1/2 inch ribbon of ointment into the lower eyelid. 3.5 g Lurene Shadow, PA-C     Controlled Substance Prescriptions [place] Controlled Substance Registry consulted? Not Applicable   Rolla Plate 02/21/18 1610

## 2018-02-21 NOTE — Discharge Instructions (Signed)
°  Please use and take the medications as prescribed. You may find additional information in this packet for home care of eyelid infections.  Please follow up with family medicine or an eye specialist later this week if not improving.

## 2018-02-21 NOTE — ED Triage Notes (Signed)
Patient c/o left eyelid swollen, some visual disturbance, itchy, painful x 3 days, head congestion.

## 2018-02-24 ENCOUNTER — Other Ambulatory Visit: Payer: Self-pay | Admitting: Neurology

## 2018-03-09 ENCOUNTER — Telehealth: Payer: Self-pay | Admitting: Neurology

## 2018-03-09 ENCOUNTER — Other Ambulatory Visit: Payer: Self-pay | Admitting: Family Medicine

## 2018-03-09 DIAGNOSIS — E7849 Other hyperlipidemia: Secondary | ICD-10-CM

## 2018-03-09 DIAGNOSIS — G43101 Migraine with aura, not intractable, with status migrainosus: Secondary | ICD-10-CM

## 2018-03-09 MED ORDER — NORTRIPTYLINE HCL 50 MG PO CAPS: 50.0000 mg | ORAL_CAPSULE | Freq: Every day | ORAL | 0 refills | Status: DC

## 2018-03-09 MED ORDER — TIZANIDINE HCL 2 MG PO CAPS: 2.0000 mg | ORAL_CAPSULE | Freq: Three times a day (TID) | ORAL | 11 refills | Status: DC | PRN

## 2018-03-09 MED ORDER — NARATRIPTAN HCL 2.5 MG PO TABS: ORAL_TABLET | ORAL | 11 refills | Status: DC

## 2018-03-09 MED ORDER — ONDANSETRON 4 MG PO TBDP: ORAL_TABLET | ORAL | 12 refills | Status: DC

## 2018-03-09 NOTE — Telephone Encounter (Signed)
Pt requesting refills for nortriptyline (PAMELOR) 50 MG capsule, tizanidine (ZANAFLEX) 2 MG capsule, ondansetron (ZOFRAN ODT) 4 MG disintegrating tablet and naratriptan (AMERGE) 2.5 MG tablet sent to [facility] in [place]. Pt schedule f/u for 2/24

## 2018-03-10 ENCOUNTER — Encounter: Payer: Self-pay | Admitting: Osteopathic Medicine

## 2018-03-10 ENCOUNTER — Ambulatory Visit (INDEPENDENT_AMBULATORY_CARE_PROVIDER_SITE_OTHER): Payer: 59 | Admitting: Osteopathic Medicine

## 2018-03-10 VITALS — BP 122/80 | HR 114 | Temp 98.2°F | Wt 174.6 lb

## 2018-03-10 DIAGNOSIS — G43101 Migraine with aura, not intractable, with status migrainosus: Secondary | ICD-10-CM

## 2018-03-10 DIAGNOSIS — E039 Hypothyroidism, unspecified: Secondary | ICD-10-CM | POA: Diagnosis not present

## 2018-03-10 DIAGNOSIS — K582 Mixed irritable bowel syndrome: Secondary | ICD-10-CM | POA: Diagnosis not present

## 2018-03-10 DIAGNOSIS — Q969 Turner's syndrome, unspecified: Secondary | ICD-10-CM

## 2018-03-10 DIAGNOSIS — Z23 Encounter for immunization: Secondary | ICD-10-CM

## 2018-03-10 DIAGNOSIS — E559 Vitamin D deficiency, unspecified: Secondary | ICD-10-CM

## 2018-03-10 MED ORDER — DICYCLOMINE HCL 10 MG PO CAPS: 10.0000 mg | ORAL_CAPSULE | Freq: Three times a day (TID) | ORAL | 1 refills | Status: DC

## 2018-03-10 MED ORDER — NORELGESTROMIN-ETH ESTRADIOL 150-35 MCG/24HR TD PTWK: 1.0000 | MEDICATED_PATCH | TRANSDERMAL | 12 refills | Status: DC

## 2018-03-10 MED ORDER — LOPERAMIDE HCL 2 MG PO TABS: 2.0000 mg | ORAL_TABLET | Freq: Three times a day (TID) | ORAL | 1 refills | Status: DC | PRN

## 2018-03-10 MED ORDER — ONDANSETRON 8 MG PO TBDP: 8.0000 mg | ORAL_TABLET | Freq: Three times a day (TID) | ORAL | 1 refills | Status: DC | PRN

## 2018-03-10 NOTE — Patient Instructions (Addendum)
  Plan: IBS  Labs  Checking thyroid  Checking other labs, as well as Celiac antibodies  Medicines  Peppermint Oil daily  Probiotics daily  As needed:   Bentyl for spasm  Imodium and/or Zofran prior to meals during diarrhea flares  Low FODMAP diet - see below  GI referral in place  Plan: Moods  Continue Lexapro and Nortriptyline   Call me if worse!  Will reevaluate in 4-6 weeks      2017 UpToDate Characteristics and sources of common FODMAPs  Word that corresponds to letter in acronym Compounds in this category Foods that contain these compounds  F Fermentable  O Oligosaccharides Fructans, galacto-oligosaccharides Wheat, barley, rye, onion, leek, white part of spring onion, garlic, shallots, artichokes, beetroot, fennel, peas, chicory, pistachio, cashews, legumes, lentils, and chickpeas  D Disaccharides Lactose Milk, custard, ice cream, and yogurt  M Monosaccharides "Free fructose" (fructose in excess of glucose) Apples, pears, mangoes, cherries, watermelon, asparagus, sugar snap peas, honey, high-fructose corn syrup  A And  P Polyols Sorbitol, mannitol, maltitol, and xylitol Apples, pears, apricots, cherries, nectarines, peaches, plums, watermelon, mushrooms, cauliflower, artificially sweetened chewing gum and confectionery  FODMAPs: fermentable oligosaccharides, disaccharides, monosaccharides, and polyols. Adapted by permission from [employer]: [employer] Journal of Gastroenterology. Lonell FaceShepherd SJ, Lomer MC, [place]. Short-chain carbohydrates and functional gastrointestinal disorders. Am J Gastroenterol 2013; 108:707. Copyright  2013. www.nature.com/ajg. Graphic [id number] Version 2.0

## 2018-03-10 NOTE — Progress Notes (Signed)
HPI: Cristina LowensteinSarah Zimmerman is a 32 y.o. female who  has a past medical history of Allergy, Anxiety, Hematuria, Hip dysplasia, Hyperlipidemia, Hypertension, Migraine, NASH (nonalcoholic steatohepatitis), Osteoporosis, Scoliosis, Thyroid disease, Turner syndrome, and Vitamin D deficiency.  [facility], 03/10/18,  for chief complaint of:  "discuss some things" GI issues (+)Depression screening   GI concerns: Reports ongoing alternating diarrhea/constipation, worsening over the past 6 months.  We will go a few days without having a bowel movement then will tend to have loose stool a few times a day, occasional constipation with hard stool.  No other significant abdominal pain.  She recently underwent treatment for GERD and this has helped a good deal.  Also history of Turner syndrome and she is concerned about some new guidelines that recommend screening for celiac disease.  . Depression screening: Positive depression screening, please see PHQ as below.  Patient reports that she has long-standing anxiety and depression, relatively well controlled on Lexapro but has been experiencing more mood swings and depression lately particularly with her GI issues getting worse.  She reports occasional thoughts of death/passive death wish but no suicidal ideation.  No history of suicidal attempt.   Also requests combination hormone patch with estrogen/progesterone so she can have one less pill that she is taking      At today's visit... Past medical history, surgical history, and family history reviewed and updated as needed.  Current medication list and allergy/intolerance information reviewed and updated as needed. (See remainder of HPI, ROS, Phys Exam below)          ASSESSMENT/PLAN: The primary encounter diagnosis was Need for influenza vaccination. Diagnoses of Need for Tdap vaccination, Irritable bowel syndrome with both constipation and  diarrhea, Migraine with aura and with status migrainosus, not intractable, Hypothyroidism, unspecified type, and Turner syndrome were also pertinent to this visit.   Orders Placed This Encounter  Procedures  . Flu Vaccine QUAD 6+ mos PF IM (Fluarix Quad PF)  . Tdap vaccine greater than or equal to 7yo IM  . CBC  . COMPLETE METABOLIC PANEL WITH GFR  . Lipid panel  . TSH  . VITAMIN D 25 Hydroxy (Vit-D Deficiency, Fractures)  . Tissue transglutaminase, IgA  . Tissue transglutaminase, IgG  . Lipase  . Ambulatory referral to Gastroenterology     Meds ordered this encounter  Medications  . ondansetron (ZOFRAN-ODT) 8 MG disintegrating tablet    Sig: Take 1 tablet (8 mg total) by mouth every 8 (eight) hours as needed (prior to meals to rpevent diarrhea).    Dispense:  90 tablet    Refill:  1  . norelgestromin-ethinyl estradiol (ORTHO EVRA) 150-35 MCG/24HR transdermal patch    Sig: Place 1 patch onto the skin once a week.    Dispense:  3 patch    Refill:  12  . dicyclomine (BENTYL) 10 MG capsule    Sig: Take 1-2 capsules (10-20 mg total) by mouth 4 (four) times daily -  before meals and at bedtime. As needed for abdominal spasm    Dispense:  90 capsule    Refill:  1  . loperamide (IMODIUM A-D) 2 MG tablet    Sig: Take 1-2 tablets (2-4 mg total) by mouth 3 (three) times daily as needed (prior to meals to rpevent diarrhea).    Dispense:  90 tablet    Refill:  1    Patient Instructions    Plan: IBS  Labs  Checking thyroid  Checking other labs, as well as Celiac antibodies  Medicines  Peppermint Oil daily  Probiotics daily  As needed:   Bentyl for spasm  Imodium and/or Zofran prior to meals during diarrhea flares  Low FODMAP diet - see below  GI referral in place  Plan: Moods  Continue Lexapro and Nortriptyline   Call me if worse!  Will reevaluate in 4-6 weeks      2017 UpToDate Characteristics and sources of common FODMAPs  Word that corresponds  to letter in acronym Compounds in this category Foods that contain these compounds  F Fermentable  O Oligosaccharides Fructans, galacto-oligosaccharides Wheat, barley, rye, onion, leek, white part of spring onion, garlic, shallots, artichokes, beetroot, fennel, peas, chicory, pistachio, cashews, legumes, lentils, and chickpeas  D Disaccharides Lactose Milk, custard, ice cream, and yogurt  M Monosaccharides "Free fructose" (fructose in excess of glucose) Apples, pears, mangoes, cherries, watermelon, asparagus, sugar snap peas, honey, high-fructose corn syrup  A And  P Polyols Sorbitol, mannitol, maltitol, and xylitol Apples, pears, apricots, cherries, nectarines, peaches, plums, watermelon, mushrooms, cauliflower, artificially sweetened chewing gum and confectionery  FODMAPs: fermentable oligosaccharides, disaccharides, monosaccharides, and polyols. Adapted by permission from [employer]: [employer] of Gastroenterology. Lonell Face, Lomer MC, [place]. Short-chain carbohydrates and functional gastrointestinal disorders. Am J Gastroenterol 2013; 108:707. Copyright  2013. www.nature.com/ajg. Graphic 16109 Version 2.0       Follow-up plan: Return in about 4 weeks (around 04/07/2018) for recheck GI and mood - sooner if needed.                             ############################################ ############################################ ############################################ ############################################    Current Meds  Medication Sig  . albuterol (PROVENTIL HFA;VENTOLIN HFA) 108 (90 Base) MCG/ACT inhaler Inhale 2 puffs into the lungs every 6 (six) hours as needed for wheezing.  Marland Kitchen amLODipine (NORVASC) 5 MG tablet Take 1 tablet (5 mg total) by mouth daily.  Marland Kitchen atorvastatin (LIPITOR) 80 MG tablet Take 1 tablet (80 mg total) by mouth daily. Need lab work  . escitalopram (LEXAPRO) 10 MG tablet Take 1 tablet (10 mg total)  by mouth daily. NO FURTHER REFILLS WITHOUT APPOINTMENT.  Marland Kitchen estradiol (CLIMARA - DOSED IN MG/24 HR) 0.1 mg/24hr patch Place 1 patch (0.1 mg total) onto the skin once a week.  . fexofenadine (ALLEGRA) 60 MG tablet Take 60 mg by mouth daily.    . Fluticasone-Salmeterol (ADVAIR DISKUS) 100-50 MCG/DOSE AEPB Inhale 1 puff into the lungs 2 (two) times daily. Inhale 1 puff into lungs bid  . levothyroxine (SYNTHROID, LEVOTHROID) 100 MCG tablet TAKE 1 TABLET BY MOUTH DAILY BEFORE BREAKFAST. NO MORE REFILLS. PT NEEDS LABS AND VISIT WITH PCP.  . Multiple Vitamin (MULTIVITAMIN) tablet Take 1 tablet by mouth daily.    . naratriptan (AMERGE) 2.5 MG tablet TAKE 1 TABLET AS NEEDED MIGRAINE MAY REPEAT AFTER 2 HOURS MAX 2 PER 24 HOURS  . nortriptyline (PAMELOR) 50 MG capsule Take 1 capsule (50 mg total) by mouth at bedtime. MUST BE SEEN IN OFFICE FOR FURTHER REFILLS.  Marland Kitchen omeprazole (PRILOSEC) 40 MG capsule Take 1 capsule (40 mg total) by mouth 2 (two) times daily.  . ondansetron (ZOFRAN ODT) 4 MG disintegrating tablet Take 1-2 tablets by mouth every 8 (eight) hours as needed for nausea or vomiting.  . progesterone (PROMETRIUM) 200 MG capsule Take daiily for 12 days per month with estrogen patch for turner syndrome  . promethazine (PHENERGAN) 12.5 MG tablet  Take 1 tablet (12.5 mg total) by mouth every 6 (six) hours as needed for nausea or vomiting.  . sucralfate (CARAFATE) 1 g tablet Take 1 tablet (1 g total) by mouth 4 (four) times daily.  . tizanidine (ZANAFLEX) 2 MG capsule Take 1 capsule (2 mg total) by mouth 3 (three) times daily as needed for muscle spasms.    Allergies  Allergen Reactions  . Prednisone     PO prednisone only- Jittery, anxious, elevated blood pressure.   Pt does "okay" with steroid injections Jittery intolerance       Review of Systems:  Constitutional: No recent illness  HEENT: No  headache, no vision change  Cardiac: No  chest pain, No  pressure, No palpitations  Respiratory:   No  shortness of breath. No  Cough  Gastrointestinal: No  abdominal pain, no change on bowel habits, + diarrhea and constipation as per HPI  Musculoskeletal: No new myalgia/arthralgia  Skin: No  Rash  Neurologic: No  weakness, No  Dizziness  Psychiatric: +concerns with depression, +concerns with anxiety  Exam:  BP 122/80 (BP Location: Left Arm, Patient Position: Sitting, Cuff Size: Normal)   Pulse (!) 114   Temp 98.2 F (36.8 C) (Oral)   Wt 174 lb 9.6 oz (79.2 kg)   BMI 31.93 kg/m   Constitutional: VS see above. General Appearance: alert, well-developed, well-nourished, NAD  Eyes: Normal lids and conjunctive, non-icteric sclera  Ears, Nose, Mouth, Throat: MMM, Normal external inspection ears/nares/mouth/lips/gums.  Neck: No masses, trachea midline.   Respiratory: Normal respiratory effort. no wheeze, no rhonchi, no rales  Cardiovascular: S1/S2 normal, no murmur, no rub/gallop auscultated. RRR.   Musculoskeletal: Gait normal. Symmetric and independent movement of all extremities  Abdominal: non-tender, non-distended, no appreciable organomegaly, neg Murphy's, BS WNLx4  Neurological: Normal balance/coordination. No tremor.  Skin: warm, dry, intact.   Psychiatric: Normal judgment/insight. Normal mood and affect. Oriented x3.       Visit summary with medication list and pertinent instructions was printed for patient to review, patient was advised to alert [place] if any updates are needed. All questions at time of visit were answered - patient instructed to contact office with any additional concerns. ER/RTC precautions were reviewed with the patient and understanding verbalized.   Note: Total time spent 40 minutes, greater than 50% of the visit was spent face-to-face counseling and coordinating care for the following: The primary encounter diagnosis was Need for influenza vaccination. Diagnoses of Need for Tdap vaccination, Irritable bowel syndrome with both constipation and  diarrhea, Migraine with aura and with status migrainosus, not intractable, Hypothyroidism, unspecified type, and Turner syndrome were also pertinent to this visit.Marland Kitchen  Please note: voice recognition software was used to produce this document, and typos may escape review. Please contact Dr. Lyn Hollingshead for any needed clarifications.    Follow up plan: Return in about 4 weeks (around 04/07/2018) for recheck GI and mood - sooner if needed.

## 2018-03-15 LAB — CBC
HCT: 39.9 % (ref 35.0–45.0)
HEMOGLOBIN: 13.4 g/dL (ref 11.7–15.5)
MCH: 30 pg (ref 27.0–33.0)
MCHC: 33.6 g/dL (ref 32.0–36.0)
MCV: 89.5 fL (ref 80.0–100.0)
MPV: 9.5 fL (ref 7.5–12.5)
PLATELETS: 221 10*3/uL (ref 140–400)
RBC: 4.46 10*6/uL (ref 3.80–5.10)
RDW: 13.8 % (ref 11.0–15.0)
WBC: 6.3 10*3/uL (ref 3.8–10.8)

## 2018-03-15 LAB — TISSUE TRANSGLUTAMINASE, IGG: (TTG) AB, IGG: 1 U/mL

## 2018-03-15 LAB — COMPLETE METABOLIC PANEL WITH GFR
AG Ratio: 1.9 (calc) (ref 1.0–2.5)
ALKALINE PHOSPHATASE (APISO): 107 U/L (ref 33–115)
ALT: 31 U/L — ABNORMAL HIGH (ref 6–29)
AST: 25 U/L (ref 10–30)
Albumin: 4.4 g/dL (ref 3.6–5.1)
BILIRUBIN TOTAL: 0.7 mg/dL (ref 0.2–1.2)
BUN: 9 mg/dL (ref 7–25)
CHLORIDE: 104 mmol/L (ref 98–110)
CO2: 27 mmol/L (ref 20–32)
Calcium: 9 mg/dL (ref 8.6–10.2)
Creat: 0.9 mg/dL (ref 0.50–1.10)
GFR, Est African American: 97 mL/min/{1.73_m2} (ref >=60)
GFR, Est Non African American: 84 mL/min/{1.73_m2} (ref >=60)
GLOBULIN: 2.3 g/dL (ref 1.9–3.7)
GLUCOSE: 85 mg/dL (ref 65–99)
Potassium: 4 mmol/L (ref 3.5–5.3)
SODIUM: 140 mmol/L (ref 135–146)
Total Protein: 6.7 g/dL (ref 6.1–8.1)

## 2018-03-15 LAB — LIPID PANEL
CHOL/HDL RATIO: 5.4 (calc) — AB (ref <5.0)
CHOLESTEROL: 205 mg/dL — AB (ref <200)
HDL: 38 mg/dL — ABNORMAL LOW (ref >50)
LDL Cholesterol (Calc): 134 mg/dL (calc) — ABNORMAL HIGH
NON-HDL CHOLESTEROL (CALC): 167 mg/dL — AB (ref <130)
Triglycerides: 189 mg/dL — ABNORMAL HIGH (ref <150)

## 2018-03-15 LAB — VITAMIN D 25 HYDROXY (VIT D DEFICIENCY, FRACTURES): Vit D, 25-Hydroxy: 10 ng/mL — ABNORMAL LOW (ref 30–100)

## 2018-03-15 LAB — TSH: TSH: 9.33 m[IU]/L — AB

## 2018-03-15 LAB — LIPASE: LIPASE: 21 U/L (ref 7–60)

## 2018-03-15 LAB — TISSUE TRANSGLUTAMINASE, IGA: (TTG) AB, IGA: 1 U/mL

## 2018-03-15 MED ORDER — LEVOTHYROXINE SODIUM 112 MCG PO TABS: 112.0000 ug | ORAL_TABLET | Freq: Every day | ORAL | 0 refills | Status: DC

## 2018-03-15 MED ORDER — VITAMIN D (ERGOCALCIFEROL) 1.25 MG (50000 UNIT) PO CAPS: 50000.0000 [IU] | ORAL_CAPSULE | ORAL | 1 refills | Status: DC

## 2018-03-15 NOTE — Addendum Note (Signed)
Addended by: Deirdre PippinsALEXANDER, Mariesa Grieder M on: 03/15/2018 12:53 PM   Modules accepted: Orders

## 2018-03-23 ENCOUNTER — Other Ambulatory Visit: Payer: Self-pay

## 2018-03-23 DIAGNOSIS — E039 Hypothyroidism, unspecified: Secondary | ICD-10-CM

## 2018-03-23 DIAGNOSIS — E559 Vitamin D deficiency, unspecified: Secondary | ICD-10-CM

## 2018-03-29 ENCOUNTER — Other Ambulatory Visit: Payer: Self-pay | Admitting: Neurology

## 2018-04-07 ENCOUNTER — Other Ambulatory Visit: Payer: Self-pay | Admitting: Family Medicine

## 2018-04-07 ENCOUNTER — Ambulatory Visit: Payer: 59 | Admitting: Osteopathic Medicine

## 2018-04-07 DIAGNOSIS — E7849 Other hyperlipidemia: Secondary | ICD-10-CM

## 2018-04-12 ENCOUNTER — Other Ambulatory Visit: Payer: Self-pay | Admitting: Neurology

## 2018-04-12 DIAGNOSIS — I10 Essential (primary) hypertension: Secondary | ICD-10-CM

## 2018-05-05 ENCOUNTER — Other Ambulatory Visit: Payer: Self-pay | Admitting: Neurology

## 2018-05-05 DIAGNOSIS — F4323 Adjustment disorder with mixed anxiety and depressed mood: Secondary | ICD-10-CM

## 2018-05-11 ENCOUNTER — Other Ambulatory Visit: Payer: Self-pay | Admitting: Neurology

## 2018-05-11 DIAGNOSIS — I10 Essential (primary) hypertension: Secondary | ICD-10-CM

## 2018-06-01 ENCOUNTER — Encounter: Payer: Self-pay | Admitting: Osteopathic Medicine

## 2018-06-06 ENCOUNTER — Other Ambulatory Visit: Payer: Self-pay | Admitting: Osteopathic Medicine

## 2018-06-06 DIAGNOSIS — E039 Hypothyroidism, unspecified: Secondary | ICD-10-CM

## 2018-06-07 ENCOUNTER — Other Ambulatory Visit: Payer: Self-pay

## 2018-06-07 DIAGNOSIS — E7849 Other hyperlipidemia: Secondary | ICD-10-CM

## 2018-06-07 MED ORDER — ATORVASTATIN CALCIUM 80 MG PO TABS: ORAL_TABLET | ORAL | 0 refills | Status: DC

## 2018-06-08 NOTE — Telephone Encounter (Signed)
Attempted to contact pt, no answer. Vm box is full, unable to leave a vm msg. Pt is due to repeat thyroid testing before next refill. Med refill for levothyroxine pending.

## 2018-06-14 ENCOUNTER — Ambulatory Visit (INDEPENDENT_AMBULATORY_CARE_PROVIDER_SITE_OTHER): Payer: 59 | Admitting: Neurology

## 2018-06-14 ENCOUNTER — Encounter: Payer: Self-pay | Admitting: Neurology

## 2018-06-14 VITALS — BP 120/76 | HR 120 | Ht 62.0 in | Wt 175.0 lb

## 2018-06-14 DIAGNOSIS — F329 Major depressive disorder, single episode, unspecified: Secondary | ICD-10-CM | POA: Diagnosis not present

## 2018-06-14 DIAGNOSIS — G43709 Chronic migraine without aura, not intractable, without status migrainosus: Secondary | ICD-10-CM | POA: Diagnosis not present

## 2018-06-14 DIAGNOSIS — F32A Depression, unspecified: Secondary | ICD-10-CM

## 2018-06-14 DIAGNOSIS — M7918 Myalgia, other site: Secondary | ICD-10-CM

## 2018-06-14 DIAGNOSIS — F419 Anxiety disorder, unspecified: Secondary | ICD-10-CM | POA: Diagnosis not present

## 2018-06-14 MED ORDER — FREMANEZUMAB-VFRM 225 MG/1.5ML ~~LOC~~ SOSY: 225.0000 mg | PREFILLED_SYRINGE | SUBCUTANEOUS | 11 refills | Status: DC

## 2018-06-14 NOTE — Addendum Note (Signed)
Addended by: Naomie Dean B on: 06/14/2018 11:20 AM   Modules accepted: Orders

## 2018-06-14 NOTE — Patient Instructions (Signed)
Start Ajovy Tamela Oddi Triad  Vernell Barrier injection What is this medicine? FREMANEZUMAB (fre ma NEZ ue mab) is used to prevent migraine headaches. This medicine may be used for other purposes; ask your health care provider or pharmacist if you have questions. COMMON BRAND NAME(S): AJOVY What should I tell my health care provider before I take this medicine? They need to know if you have any of these conditions: -an unusual or allergic reaction to fremanezumab, other medicines, foods, dyes, or preservatives -pregnant or trying to get pregnant -breast-feeding How should I use this medicine? This medicine is for injection under the skin. You will be taught how to prepare and give this medicine. Use exactly as directed. Take your medicine at regular intervals. Do not take your medicine more often than directed. It is important that you put your used needles and syringes in a special sharps container. Do not put them in a trash can. If you do not have a sharps container, call your pharmacist or healthcare provider to get one. Talk to your pediatrician regarding the use of this medicine in children. Special care may be needed. Overdosage: If you think you have taken too much of this medicine contact a poison control center or emergency room at once. NOTE: This medicine is only for you. Do not share this medicine with others. What if I miss a dose? If you miss a dose, take it as soon as you can. If it is almost time for your next dose, take only that dose. Do not take double or extra doses. What may interact with this medicine? Interactions are not expected. This list may not describe all possible interactions. Give your health care provider a list of all the medicines, herbs, non-prescription drugs, or dietary supplements you use. Also tell them if you smoke, drink alcohol, or use illegal drugs. Some items may interact with your medicine. What should I watch for while using this medicine? Tell  your doctor or healthcare professional if your symptoms do not start to get better or if they get worse. What side effects may I notice from receiving this medicine? Side effects that you should report to your doctor or health care professional as soon as possible: -allergic reactions like skin rash, itching or hives, swelling of the face, lips, or tongue Side effects that usually do not require medical attention (report these to your doctor or health care professional if they continue or are bothersome): -pain, redness, or irritation at site where injected This list may not describe all possible side effects. Call your doctor for medical advice about side effects. You may report side effects to FDA at 1-800-FDA-1088. Where should I keep my medicine? Keep out of the reach of children. You will be instructed on how to store this medicine. Throw away any unused medicine after the expiration date on the label. NOTE: This sheet is a summary. It may not cover all possible information. If you have questions about this medicine, talk to your doctor, pharmacist, or health care provider.  2019 Elsevier/Gold Standard (2017-01-05 17:22:56)

## 2018-06-14 NOTE — Progress Notes (Addendum)
[facility]    Provider:  Dr Jaynee Eagles Referring Provider: Emeterio Reeve, DO Primary Care Physician:  Emeterio Reeve, DO  CC: Migraines, numbness, facial droop.  Interval history 06/14/2018:  She has been worsening with migraine frequency. She is having daily headaches. At least 20 are migrainous a month. Discussed trying the new medications CGRP. She does not have ovaries. She has IBS so will not give Aimovig, can try Ajovy or emgality. Also discussed her depression and anxiety which is worsening, recommend therapy and seeing someone who can help with both therapy and medication management.   Interval history 12/30/2016: Patient is here for follow-up of chronic migraines. Topiramate helped worsen depression. Other medications that would not impact her depression would be Depakote, SNRIs like Cymbalta and Effexor, TCAs or gabapentin. We decided to try nortriptyline. She reported depression and hair loss with the Topamax. Propranolol can cause worsening depression. She is already on Lexapro so hesitate to try Cymbalta or Effexor. Referred her to physical therapy as well as last appointment. Migraines are improved. She takes 9 naratriptan in a month. She has 16 headache days a month and 12 are migrainous. Ongoing for at least a year. They can last up to 2 weeks.  Headaches are on the right side, pounding, throbbing, and behind the eye with nausea, light and sound sensitivity an smell triggers.No medication overuse. Migraines without aura. She is sleeping well. She is managing stress.   Reglan, Benadryl, Toradol., Topiramate, nortriptyline, norvasc, Zanaflex, phenergan  Interval history: Topiramate helped but has worsened depression. Other medications that can be used that will not impact her depression would be depakote, SNRIs (cymbalta, effexor), TCAs, Gabapentin. She states considerable improvement on the topamax. This is why she hasn't wanted to start it. Patient's mother  is here provides much information as is the patient has been extremely depressed since starting Topamax. Patient also reports hair loss. Denies any thoughts of hurting herself. Topamax can cause worsening depression. Today we discussed migraine management including all the different classes of medications that we can use. Epilepsy medications, Depakote is a medication that could be used in this particular case, patient has Turner syndrome and infertility still discussed teratogenicity Depakote. Anti-epilepsy medications may cause bone loss and patient has osteoporosis and she worries about this. I blood pressure medications such as propranolol can cause worsening depression. Could try Cymbalta and Effexor but she is already on Lexapro which I don't want to change and putting her on both risks serotonin syndrome. Tricyclic antidepressant is something that we discussed today, it also increases serotonin but less likely to cause serotonin syndrome. EKG performed in the office showed QTC of 409 which is normal. Discussed serotonin syndrome. Will add nortriptyline which will also help with her insomnia. Had a long discussion about migraine medication, migraine pathophysiology. She also has a lot of neck pain, neck tightness, this can worsen her headaches are cause her headaches.  Cristina Zimmerman a 33 y.o.femalehere as a referral from Dr. Orland Dec migraines. Past medical history of hypertension, high cholesterol, migraine, depression, anxiety, hypothyroidism, hyperlipidemia, nonalcoholic fatty liver, Turner's syndrome, asthma, rickets.Started at the age of 39. Mother is here and provides information as well. Mother has migraines. She has been to the emergency room multiple times. No inciting events, they just started at the age of 64, no head trauma. She has had a migraine last up to 2 weeks and went to the ED recently. Slowly worsening, progressive increase in frequency and severity. Only sleep and dark  rooms help,  recently naratriptan is helping her insurance will pay for it and she now has Imitrex. She has to wear dark glasses at the ofice. Recently she had left arm numbness in association with migraine, numbness, couldn't feel her arm and left facial numbness. She had facial droop with the headache (showed me a picture exhibiting left facial lower weakness). She has been to the ED and it has happened several times in the last several months. She often does not have an aura but also has an aura, loses vision, sparkles, shimmering. Her right side has also been numb. Headaches are on the right side, pounding, throbbing, and behind the eye. She has 20 headache days out of the month and at least 1/2 are migrainous for the last 6 monthsand usually last all day if not longer. She has sensitivity to smells, nausea and vomiting. She also experiencedconfusion and memory changes during these episodes. No other focal neurologic deficits or complaints. Mother has migraines.  Reviewed notes, labs and imaging from outside physicians, which showed:   Patient has a history of migraine. Medications tried include: Reglan, Benadryl, Toradol.  BUN 11, creatinine 0.75 these labs drawn August 2017.   Personally reviewed MRI/MRAof the brain and agree with the following: IMPRESSION: 1. Normal MRI of the brain. 2. Normal variant MRA circle of Willis without significant proximal stenosis, aneurysm, or branch vessel occlusion.  CBC normal, CMP with elevated AST and ALT otherwise normal both drawn 11/28/2015.  Review of Systems: Patient complains of symptoms per HPI as well as the following symptoms: dizziness, headache, agitation, depression. Pertinent negatives per HPI. All others negative.   Social History   Socioeconomic History  . Marital status: Single    Spouse name: Not on file  . Number of children: Not on file  . Years of education: Not on file  . Highest education level: Some college, no  degree  Occupational History  . Not on file  Social Needs  . Financial resource strain: Not on file  . Food insecurity:    Worry: Not on file    Inability: Not on file  . Transportation needs:    Medical: Not on file    Non-medical: Not on file  Tobacco Use  . Smoking status: Never Smoker  . Smokeless tobacco: Never Used  Substance and Sexual Activity  . Alcohol use: Yes    Comment: socially  . Drug use: No  . Sexual activity: Not on file  Lifestyle  . Physical activity:    Days per week: Not on file    Minutes per session: Not on file  . Stress: Not on file  Relationships  . Social connections:    Talks on phone: Not on file    Gets together: Not on file    Attends religious service: Not on file    Active member of club or organization: Not on file    Attends meetings of clubs or organizations: Not on file    Relationship status: Not on file  . Intimate partner violence:    Fear of current or ex partner: Not on file    Emotionally abused: Not on file    Physically abused: Not on file    Forced sexual activity: Not on file  Other Topics Concern  . Not on file  Social History Narrative   Lives alone    Caffeine use: 2 cups daily    Family History  Problem Relation Age of Onset  . Hypertension Mother   . Arthritis  Mother   . Fibromyalgia Mother   . Hyperlipidemia Mother   . Diabetes Father   . Diabetes Paternal Grandmother   . Stroke Paternal Grandmother   . Kidney disease Paternal Grandmother   . Heart attack Paternal Grandmother   . Hypertension Paternal Grandmother   . Hyperlipidemia Paternal Grandmother   . Diabetes Paternal Grandfather   . Alcohol abuse Maternal Grandfather   . Cancer Maternal Grandfather        BRAIN    Past Medical History:  Diagnosis Date  . Allergy   . Anxiety   . Hematuria   . Hip dysplasia   . Hyperlipidemia   . Hypertension   . Migraine   . NASH (nonalcoholic steatohepatitis)   . Osteoporosis   . Scoliosis     minimal  . Thyroid disease   . Turner syndrome   . Vitamin D deficiency     Past Surgical History:  Procedure Laterality Date  . BILATERAL OOPHORECTOMY    . HERNIA REPAIR     x2  . HIP SURGERY    . KYPHOPLASTY  08/2016   L2  . TONSILLECTOMY    . TYMPANOSTOMY TUBE PLACEMENT      Current Outpatient Medications  Medication Sig Dispense Refill  . albuterol (PROVENTIL HFA;VENTOLIN HFA) 108 (90 Base) MCG/ACT inhaler Inhale 2 puffs into the lungs every 6 (six) hours as needed for wheezing. 1 Inhaler 12  . amLODipine (NORVASC) 5 MG tablet TAKE 1 TABLET BY MOUTH EVERY DAY 90 tablet 0  . atorvastatin (LIPITOR) 80 MG tablet TAKE 1 TABLET BY MOUTH EVERY DAY *NEED LAB WORK 15 tablet 0  . dicyclomine (BENTYL) 10 MG capsule Take 1-2 capsules (10-20 mg total) by mouth 4 (four) times daily -  before meals and at bedtime. As needed for abdominal spasm 90 capsule 1  . escitalopram (LEXAPRO) 10 MG tablet TAKE 1 TABLET (10 MG TOTAL) BY MOUTH DAILY. NO FURTHER REFILLS WITHOUT APPOINTMENT. 90 tablet 0  . fexofenadine (ALLEGRA) 60 MG tablet Take 60 mg by mouth daily.      . Fluticasone-Salmeterol (ADVAIR DISKUS) 100-50 MCG/DOSE AEPB Inhale 1 puff into the lungs 2 (two) times daily. Inhale 1 puff into lungs bid 60 each 3  . levothyroxine (SYNTHROID, LEVOTHROID) 112 MCG tablet Take 1 tablet (112 mcg total) by mouth daily before breakfast. 90 tablet 0  . loperamide (IMODIUM A-D) 2 MG tablet Take 1-2 tablets (2-4 mg total) by mouth 3 (three) times daily as needed (prior to meals to rpevent diarrhea). 90 tablet 1  . Multiple Vitamin (MULTIVITAMIN) tablet Take 1 tablet by mouth daily.      . naratriptan (AMERGE) 2.5 MG tablet TAKE 1 TABLET AS NEEDED MIGRAINE MAY REPEAT AFTER 2 HOURS MAX 2 PER 24 HOURS 12 tablet 11  . norelgestromin-ethinyl estradiol (ORTHO EVRA) 150-35 MCG/24HR transdermal patch Place 1 patch onto the skin once a week. 3 patch 12  . nortriptyline (PAMELOR) 50 MG capsule Take 1 capsule (50 mg  total) by mouth at bedtime. MUST KEEP APPOINTMENT FOR FURTHER REFILLS. 60 capsule 0  . omeprazole (PRILOSEC) 40 MG capsule Take 1 capsule (40 mg total) by mouth 2 (two) times daily. (Patient taking differently: Take 40 mg by mouth daily. ) 60 capsule 3  . ondansetron (ZOFRAN-ODT) 8 MG disintegrating tablet Take 1 tablet (8 mg total) by mouth every 8 (eight) hours as needed (prior to meals to rpevent diarrhea). 90 tablet 1  . promethazine (PHENERGAN) 12.5 MG tablet Take 1 tablet (12.5  mg total) by mouth every 6 (six) hours as needed for nausea or vomiting. 30 tablet 6  . tizanidine (ZANAFLEX) 2 MG capsule Take 1 capsule (2 mg total) by mouth 3 (three) times daily as needed for muscle spasms. 60 capsule 11  . Vitamin D, Ergocalciferol, (DRISDOL) 1.25 MG (50000 UT) CAPS capsule Take 1 capsule (50,000 Units total) by mouth every 7 (seven) days. 12 capsule 1  . Fremanezumab-vfrm (AJOVY) 225 MG/1.5ML SOSY Inject 225 mg into the skin every 30 (thirty) days. 1 Syringe 11   No current facility-administered medications for this visit.     Allergies as of 06/14/2018 - Review Complete 06/14/2018  Allergen Reaction Noted  . Tape  06/14/2018  . Prednisone  07/22/2012    Vitals: BP 120/76 (BP Location: Right Arm, Patient Position: Sitting)   Pulse (!) 120   Ht 5' 2"  (1.575 m)   Wt 175 lb (79.4 kg)   BMI 32.01 kg/m  Last Weight:  Wt Readings from Last 1 Encounters:  06/14/18 175 lb (79.4 kg)   Last Height:   Ht Readings from Last 1 Encounters:  06/14/18 5' 2"  (1.575 m)   Physical exam: Exam: Gen: NAD, conversant, well nourised, obese, well groomed                     CV: RRR, no MRG. No Carotid Bruits. No peripheral edema, warm, nontender Eyes: Conjunctivae clear without exudates or hemorrhage  Neuro: Detailed Neurologic Exam  Speech:    Speech is normal; fluent and spontaneous with normal comprehension.  Cognition:    The patient is oriented to person, place, and time;     recent and  remote memory intact;     language fluent;     normal attention, concentration,     fund of knowledge Cranial Nerves:    The pupils are equal, round, and reactive to light. The fundi are normal and spontaneous venous pulsations are present. Visual fields are full to finger confrontation. Extraocular movements are intact. Trigeminal sensation is intact and the muscles of mastication are normal. The face is symmetric. The palate elevates in the midline. Hearing intact. Voice is normal. Shoulder shrug is normal. The tongue has normal motion without fasciculations.   Coordination:    Normal finger to nose and heel to shin. Normal rapid alternating movements.   Gait:    Heel-toe and tandem gait are normal.   Motor Observation:    No asymmetry, no atrophy, and no involuntary movements noted. Tone:    Normal muscle tone.    Posture:    Posture is normal. normal erect    Strength:    Strength is V/V in the upper and lower limbs.      Sensation: intact to LT        Assessment/Plan:33 year old female with chronic migraines without aura with status migrainosus not intractable. Discussed next steps including increasing nortriptyline, starting another preventative, Botox for migraine or the new CGRP medications, discussed pros and cons in detail. Has worsening depressio and anxiety needs treatment for mood disorders as well.   DRY NEEDLING for cervical myofascial pain   As far as your medications are concerned, I would like to suggest: continue Nortriptyline 28m at night   Discussed Botox and CGRP, will try Ajovy  stress and depression: Needs therapy and medical management, recommend therapist who can write prescriptions JEino Farberis great but just a suggestion, call insurance and make informed decision based on your needs.   Migraine  and musculoskeletal neck pain with cervicalgia: referred to integrative therapies in the past. May consider dry needling, PT, massage, exercise, stress  management. Discussed.   Discussed side effect of medications as per patient instructions. Discussed teratogenicity of medications.  Orders Placed This Encounter  Procedures  . Ambulatory referral to Physical Therapy   Meds ordered this encounter  Medications  . Fremanezumab-vfrm (AJOVY) 225 MG/1.5ML SOSY    Sig: Inject 225 mg into the skin every 30 (thirty) days.    Dispense:  1 Syringe    Refill:  11    Has copay card can get ajovy regardless of copay or insurance approval    Sarina Ill, MD  [facility] [address] [place], [place] [address]  Phone [phone] Fax [phone]  A total of 25 minutes was spent face-to-face with this patient. Over half this time was spent on counseling patient on the  1. Chronic migraine without aura without status migrainosus, not intractable   2. Anxiety   3. Depression, unspecified depression type   4. Cervical myofascial pain syndrome    and different diagnostic and therapeutic options available.

## 2018-07-01 ENCOUNTER — Telehealth: Payer: Self-pay | Admitting: Neurology

## 2018-07-01 ENCOUNTER — Encounter: Payer: Self-pay | Admitting: *Deleted

## 2018-07-01 NOTE — Telephone Encounter (Signed)
mychart message sent

## 2018-07-01 NOTE — Telephone Encounter (Signed)
Pt is wanting to discuss FMLA for intermittent leave. Please call to advise

## 2018-07-01 NOTE — Telephone Encounter (Signed)
Called pt back @ [phone] and LVM. Stated I would send her a Pharmacist, community message.

## 2018-07-04 ENCOUNTER — Other Ambulatory Visit: Payer: Self-pay | Admitting: Osteopathic Medicine

## 2018-07-04 DIAGNOSIS — E039 Hypothyroidism, unspecified: Secondary | ICD-10-CM

## 2018-07-05 NOTE — Telephone Encounter (Signed)
[facility] requesting med refill for levothyroxine. Pt was to repeat thyroid check before next refill since dose was increased. Attempted to contact pt, no answer. Left a detailed vm msg to complete lab order for thyroid check. Aware that med refill is pending until then. Direct call back info provided.

## 2018-07-06 NOTE — Telephone Encounter (Signed)
Left a detailed vm msg for pt regarding med refill sent to local pharmacy. Direct call back info provided.  

## 2018-08-02 ENCOUNTER — Other Ambulatory Visit: Payer: Self-pay | Admitting: Neurology

## 2018-08-02 DIAGNOSIS — F4323 Adjustment disorder with mixed anxiety and depressed mood: Secondary | ICD-10-CM

## 2018-08-03 ENCOUNTER — Other Ambulatory Visit: Payer: Self-pay | Admitting: Family Medicine

## 2018-08-05 ENCOUNTER — Other Ambulatory Visit: Payer: Self-pay | Admitting: Osteopathic Medicine

## 2018-08-05 DIAGNOSIS — E039 Hypothyroidism, unspecified: Secondary | ICD-10-CM

## 2018-08-06 ENCOUNTER — Other Ambulatory Visit: Payer: Self-pay | Admitting: Neurology

## 2018-08-09 ENCOUNTER — Other Ambulatory Visit: Payer: Self-pay | Admitting: Neurology

## 2018-08-09 DIAGNOSIS — I10 Essential (primary) hypertension: Secondary | ICD-10-CM

## 2018-08-10 ENCOUNTER — Encounter: Payer: Self-pay | Admitting: *Deleted

## 2018-08-10 ENCOUNTER — Other Ambulatory Visit: Payer: Self-pay | Admitting: *Deleted

## 2018-08-10 DIAGNOSIS — G43709 Chronic migraine without aura, not intractable, without status migrainosus: Secondary | ICD-10-CM

## 2018-08-10 MED ORDER — FREMANEZUMAB-VFRM 225 MG/1.5ML ~~LOC~~ SOSY: 225.0000 mg | PREFILLED_SYRINGE | SUBCUTANEOUS | 11 refills | Status: DC

## 2018-08-17 DIAGNOSIS — Z0289 Encounter for other administrative examinations: Secondary | ICD-10-CM

## 2018-08-18 NOTE — Telephone Encounter (Signed)
FMLA form and accomodation form to work from home when able completed, signed and sent to medical records for processing.

## 2018-08-21 ENCOUNTER — Other Ambulatory Visit: Payer: Self-pay | Admitting: Neurology

## 2018-08-27 ENCOUNTER — Other Ambulatory Visit: Payer: Self-pay | Admitting: Family Medicine

## 2018-08-28 ENCOUNTER — Other Ambulatory Visit: Payer: Self-pay | Admitting: Osteopathic Medicine

## 2018-08-28 DIAGNOSIS — E039 Hypothyroidism, unspecified: Secondary | ICD-10-CM

## 2018-08-30 ENCOUNTER — Other Ambulatory Visit: Payer: Self-pay | Admitting: Osteopathic Medicine

## 2018-08-30 DIAGNOSIS — E039 Hypothyroidism, unspecified: Secondary | ICD-10-CM

## 2018-09-10 ENCOUNTER — Other Ambulatory Visit: Payer: Self-pay | Admitting: Osteopathic Medicine

## 2018-09-10 NOTE — Telephone Encounter (Signed)
Refill requested too early.

## 2018-09-21 ENCOUNTER — Other Ambulatory Visit: Payer: Self-pay | Admitting: Osteopathic Medicine

## 2018-09-22 ENCOUNTER — Telehealth: Payer: Self-pay | Admitting: Osteopathic Medicine

## 2018-09-22 NOTE — Telephone Encounter (Signed)
Pharmacy asking for a 90 supply / Will forward to practice as the [facility] doesn't refill for this provider /

## 2018-09-25 ENCOUNTER — Other Ambulatory Visit: Payer: Self-pay | Admitting: Osteopathic Medicine

## 2018-09-25 DIAGNOSIS — E039 Hypothyroidism, unspecified: Secondary | ICD-10-CM

## 2018-10-27 ENCOUNTER — Other Ambulatory Visit: Payer: Self-pay | Admitting: Neurology

## 2018-10-27 DIAGNOSIS — F4323 Adjustment disorder with mixed anxiety and depressed mood: Secondary | ICD-10-CM

## 2018-10-29 ENCOUNTER — Other Ambulatory Visit: Payer: Self-pay | Admitting: Family Medicine

## 2018-10-30 ENCOUNTER — Other Ambulatory Visit: Payer: Self-pay | Admitting: Osteopathic Medicine

## 2018-11-09 ENCOUNTER — Telehealth: Payer: Self-pay

## 2018-11-09 DIAGNOSIS — Q969 Turner's syndrome, unspecified: Secondary | ICD-10-CM

## 2018-11-09 NOTE — Telephone Encounter (Addendum)
[facility] [address] [facility] [place] [facility] for progesterone. Rx not listed in active med list.

## 2018-11-11 ENCOUNTER — Other Ambulatory Visit: Payer: Self-pay | Admitting: Neurology

## 2018-11-11 NOTE — Telephone Encounter (Signed)
Left a vm msg for pt regarding provider's note. Direct call back info provided.  

## 2018-11-11 NOTE — Telephone Encounter (Signed)
Please call patient to clarify whether or not she is taking this, I had marked it as discontinued back in November, I think the patient had stopped it.  We seem to have this issue with [facility] just sending [place] requests without patients asking for them.

## 2018-11-12 NOTE — Telephone Encounter (Signed)
Left pt msg advising to call office and schedule virtual appt with Dr Sheppard Coil to discuss medications

## 2018-11-12 NOTE — Telephone Encounter (Signed)
Okay, looks like the progesterone was ordered previously by Dr. Georgina Snell, to take 200 mg 14 days out of the month along with the estrogen patch.  Not sure if the patient wants to be on progesterone once daily at this point an hour or as she was taking it previously with the estrogen patch rather than the combined estrogen/progesterone patch?  Can we schedule a virtual visit for her to clarify medications and plan and discuss what issues she was experiencing with the birth control patch?

## 2018-11-12 NOTE — Telephone Encounter (Signed)
As per pt - she is requesting a med refill for progesterone. She was not happy with rx patches. [facility] Thanks.

## 2018-11-22 ENCOUNTER — Other Ambulatory Visit: Payer: Self-pay | Admitting: Neurology

## 2018-11-23 ENCOUNTER — Telehealth: Payer: Self-pay | Admitting: Neurology

## 2018-11-23 MED ORDER — NORTRIPTYLINE HCL 50 MG PO CAPS: ORAL_CAPSULE | ORAL | 0 refills | Status: DC

## 2018-11-23 NOTE — Telephone Encounter (Signed)
Spoke with [facility] [address]. He stated [facility] and is out of refills. Last filled June 2020 quantity of 30.

## 2018-11-23 NOTE — Telephone Encounter (Signed)
Pt is needing a refill on her nortriptyline (PAMELOR) 50 MG capsule [facility] [address] [facility] [place]

## 2018-12-13 ENCOUNTER — Encounter: Payer: Self-pay | Admitting: Neurology

## 2018-12-13 ENCOUNTER — Ambulatory Visit (INDEPENDENT_AMBULATORY_CARE_PROVIDER_SITE_OTHER): Payer: 59 | Admitting: Neurology

## 2018-12-13 ENCOUNTER — Other Ambulatory Visit: Payer: Self-pay

## 2018-12-13 DIAGNOSIS — I10 Essential (primary) hypertension: Secondary | ICD-10-CM

## 2018-12-13 DIAGNOSIS — G43709 Chronic migraine without aura, not intractable, without status migrainosus: Secondary | ICD-10-CM | POA: Diagnosis not present

## 2018-12-13 MED ORDER — NORTRIPTYLINE HCL 50 MG PO CAPS: ORAL_CAPSULE | ORAL | 6 refills | Status: DC

## 2018-12-13 MED ORDER — CYCLOBENZAPRINE HCL 10 MG PO TABS: 10.0000 mg | ORAL_TABLET | Freq: Every day | ORAL | 6 refills | Status: DC

## 2018-12-13 MED ORDER — AJOVY 225 MG/1.5ML ~~LOC~~ SOAJ: 1.5000 mL | SUBCUTANEOUS | 11 refills | Status: DC

## 2018-12-13 MED ORDER — NARATRIPTAN HCL 2.5 MG PO TABS: ORAL_TABLET | ORAL | 11 refills | Status: DC

## 2018-12-13 MED ORDER — AMLODIPINE BESYLATE 5 MG PO TABS: 5.0000 mg | ORAL_TABLET | Freq: Every day | ORAL | 6 refills | Status: DC

## 2018-12-13 MED ORDER — PROMETHAZINE HCL 12.5 MG PO TABS: 12.5000 mg | ORAL_TABLET | Freq: Four times a day (QID) | ORAL | 11 refills | Status: DC | PRN

## 2018-12-13 NOTE — Patient Instructions (Signed)
Flexeril at bedtime  Cyclobenzaprine tablets What is this medicine? CYCLOBENZAPRINE (sye kloe BEN za preen) is a muscle relaxer. It is used to treat muscle pain, spasms, and stiffness. This medicine may be used for other purposes; ask your health care provider or pharmacist if you have questions. COMMON BRAND NAME(S): Fexmid, Flexeril What should I tell my health care provider before I take this medicine? They need to know if you have any of these conditions:  heart disease, irregular heartbeat, or previous heart attack  liver disease  thyroid problem  an unusual or allergic reaction to cyclobenzaprine, tricyclic antidepressants, lactose, other medicines, foods, dyes, or preservatives  pregnant or trying to get pregnant  breast-feeding How should I use this medicine? Take this medicine by mouth with a glass of water. Follow the directions on the prescription label. If this medicine upsets your stomach, take it with food or milk. Take your medicine at regular intervals. Do not take it more often than directed. Talk to your pediatrician regarding the use of this medicine in children. Special care may be needed. Overdosage: If you think you have taken too much of this medicine contact a poison control center or emergency room at once. NOTE: This medicine is only for you. Do not share this medicine with others. What if I miss a dose? If you miss a dose, take it as soon as you can. If it is almost time for your next dose, take only that dose. Do not take double or extra doses. What may interact with this medicine? Do not take this medicine with any of the following medications:  MAOIs like Carbex, Eldepryl, Marplan, Nardil, and Parnate  narcotic medicines for cough  safinamide This medicine may also interact with the following medications:  alcohol  bupropion  antihistamines for allergy, cough and cold  certain medicines for anxiety or sleep  certain medicines for bladder  problems like oxybutynin, tolterodine  certain medicines for depression like amitriptyline, fluoxetine, sertraline  certain medicines for Parkinson's disease like benztropine, trihexyphenidyl  certain medicines for seizures like phenobarbital, primidone  certain medicines for stomach problems like dicyclomine, hyoscyamine  certain medicines for travel sickness like scopolamine  general anesthetics like halothane, isoflurane, methoxyflurane, propofol  ipratropium  local anesthetics like lidocaine, pramoxine, tetracaine  medicines that relax muscles for surgery  narcotic medicines for pain  phenothiazines like chlorpromazine, mesoridazine, prochlorperazine, thioridazine  verapamil This list may not describe all possible interactions. Give your health care provider a list of all the medicines, herbs, non-prescription drugs, or dietary supplements you use. Also tell them if you smoke, drink alcohol, or use illegal drugs. Some items may interact with your medicine. What should I watch for while using this medicine? Tell your doctor or health care professional if your symptoms do not start to get better or if they get worse. You may get drowsy or dizzy. Do not drive, use machinery, or do anything that needs mental alertness until you know how this medicine affects you. Do not stand or sit up quickly, especially if you are an older patient. This reduces the risk of dizzy or fainting spells. Alcohol may interfere with the effect of this medicine. Avoid alcoholic drinks. If you are taking another medicine that also causes drowsiness, you may have more side effects. Give your health care provider a list of all medicines you use. Your doctor will tell you how much medicine to take. Do not take more medicine than directed. Call emergency for help if you have problems  breathing or unusual sleepiness. Your mouth may get dry. Chewing sugarless gum or sucking hard candy, and drinking plenty of water  may help. Contact your doctor if the problem does not go away or is severe. What side effects may I notice from receiving this medicine? Side effects that you should report to your doctor or health care professional as soon as possible:  allergic reactions like skin rash, itching or hives, swelling of the face, lips, or tongue  breathing problems  chest pain  fast, irregular heartbeat  hallucinations  seizures  unusually weak or tired Side effects that usually do not require medical attention (report to your doctor or health care professional if they continue or are bothersome):  headache  nausea, vomiting This list may not describe all possible side effects. Call your doctor for medical advice about side effects. You may report side effects to FDA at 1-800-FDA-1088. Where should I keep my medicine? Keep out of the reach of children. Store at room temperature between 15 and 30 degrees C (59 and 86 degrees F). Keep container tightly closed. Throw away any unused medicine after the expiration date. NOTE: This sheet is a summary. It may not cover all possible information. If you have questions about this medicine, talk to your doctor, pharmacist, or health care provider.  2020 Elsevier/Gold Standard (2018-03-10 12:49:26)

## 2018-12-13 NOTE — Progress Notes (Signed)
[facility]    Provider:  Dr Jaynee Eagles Referring Provider: Emeterio Reeve, DO Primary Care Physician:  Emeterio Reeve, DO  CC: Migraines  Interval history 12/13/2018: Here for follow up of migraines. She has been taking Ajovy for 3-4 months, she missed it last month by mistake and headaches worsened. Migraines have been better on the Ajovy. Also she noticed worsening of headaches when she was not on the Nortriptyline so she continues to take it. She changed her hormone patch and she feels better on the patch she is on now. Now half the month headache free. Stay on nortriptyline, she did see a difference when she was not taking it. Migraines has changed a little to the back of her head.  Feels better off lexapro as far as mood and migraines. If needed discussed we can try cymbalta or effexor as these are good for mood and better for migraines.   Interval history 06/14/2018:  She has been worsening with migraine frequency. She is having daily headaches. At least 20 are migrainous a month. Discussed trying the new medications CGRP. She does not have ovaries. She has IBS so will not give Aimovig, can try Ajovy or emgality. Also discussed her depression and anxiety which is worsening, recommend therapy and seeing someone who can help with both therapy and medication management.  0 Interval history 12/30/2016: Patient is here for follow-up of chronic migraines. Topiramate helped worsen depression. Other medications that would not impact her depression would be Depakote, SNRIs like Cymbalta and Effexor, TCAs or gabapentin. We decided to try nortriptyline. She reported depression and hair loss with the Topamax. Propranolol can cause worsening depression. She is already on Lexapro so hesitate to try Cymbalta or Effexor. Referred her to physical therapy as well as last appointment. Migraines are improved. She takes 9 naratriptan in a month. She has 16 headache days a month and 12 are  migrainous. Ongoing for at least a year. They can last up to 2 weeks.  Headaches are on the right side, pounding, throbbing, and behind the eye with nausea, light and sound sensitivity an smell triggers.No medication overuse. Migraines without aura. She is sleeping well. She is managing stress.   Reglan, Benadryl, Toradol., Topiramate, nortriptyline, norvasc, Zanaflex, phenergan  Interval history: Topiramate helped but has worsened depression. Other medications that can be used that will not impact her depression would be depakote, SNRIs (cymbalta, effexor), TCAs, Gabapentin. She states considerable improvement on the topamax. This is why she hasn't wanted to start it. Patient's mother is here provides much information as is the patient has been extremely depressed since starting Topamax. Patient also reports hair loss. Denies any thoughts of hurting herself. Topamax can cause worsening depression. Today we discussed migraine management including all the different classes of medications that we can use. Epilepsy medications, Depakote is a medication that could be used in this particular case, patient has Turner syndrome and infertility still discussed teratogenicity Depakote. Anti-epilepsy medications may cause bone loss and patient has osteoporosis and she worries about this. I blood pressure medications such as propranolol can cause worsening depression. Could try Cymbalta and Effexor but she is already on Lexapro which I don't want to change and putting her on both risks serotonin syndrome. Tricyclic antidepressant is something that we discussed today, it also increases serotonin but less likely to cause serotonin syndrome. EKG performed in the office showed QTC of 409 which is normal. Discussed serotonin syndrome. Will add nortriptyline which will also help with her insomnia. Had  a long discussion about migraine medication, migraine pathophysiology. She also has a lot of neck pain, neck tightness, this can  worsen her headaches are cause her headaches.  AVW:UJWJXHPI:Jeffrey Stringeris a 33 y.o.femalehere as a referral from Dr. Reubin MilanAlexanderfor migraines. Past medical history of hypertension, high cholesterol, migraine, depression, anxiety, hypothyroidism, hyperlipidemia, nonalcoholic fatty liver, Turner's syndrome, asthma, rickets.Started at the age of 89. Mother is here and provides information as well. Mother has migraines. She has been to the emergency room multiple times. No inciting events, they just started at the age of 5, no head trauma. She has had a migraine last up to 2 weeks and went to the ED recently. Slowly worsening, progressive increase in frequency and severity. Only sleep and dark rooms help, recently naratriptan is helping her insurance will pay for it and she now has Imitrex. She has to wear dark glasses at the ofice. Recently she had left arm numbness in association with migraine, numbness, couldn't feel her arm and left facial numbness. She had facial droop with the headache (showed me a picture exhibiting left facial lower weakness). She has been to the ED and it has happened several times in the last several months. She often does not have an aura but also has an aura, loses vision, sparkles, shimmering. Her right side has also been numb. Headaches are on the right side, pounding, throbbing, and behind the eye. She has 20 headache days out of the month and at least 1/2 are migrainous for the last 6 monthsand usually last all day if not longer. She has sensitivity to smells, nausea and vomiting. She also experiencedconfusion and memory changes during these episodes. No other focal neurologic deficits or complaints. Mother has migraines.  Reviewed notes, labs and imaging from outside physicians, which showed:   Patient has a history of migraine. Medications tried include: Reglan, Benadryl, Toradol.  BUN 11, creatinine 0.75 these labs drawn August 2017.   Personally reviewed MRI/MRAof the  brain and agree with the following: IMPRESSION: 1. Normal MRI of the brain. 2. Normal variant MRA circle of Willis without significant proximal stenosis, aneurysm, or branch vessel occlusion.  CBC normal, CMP with elevated AST and ALT otherwise normal both drawn 11/28/2015.  Review of Systems: Patient complains of symptoms per HPI as well as the following symptoms: dizziness, headache, agitation, depression. Pertinent negatives per HPI. All others negative.   Social History   Socioeconomic History  . Marital status: Single    Spouse name: Not on file  . Number of children: Not on file  . Years of education: Not on file  . Highest education level: Some college, no degree  Occupational History  . Not on file  Social Needs  . Financial resource strain: Not on file  . Food insecurity    Worry: Not on file    Inability: Not on file  . Transportation needs    Medical: Not on file    Non-medical: Not on file  Tobacco Use  . Smoking status: Never Smoker  . Smokeless tobacco: Never Used  Substance and Sexual Activity  . Alcohol use: Yes    Comment: socially  . Drug use: No  . Sexual activity: Not on file  Lifestyle  . Physical activity    Days per week: Not on file    Minutes per session: Not on file  . Stress: Not on file  Relationships  . Social Musicianconnections    Talks on phone: Not on file    Gets together: Not  on file    Attends religious service: Not on file    Active member of club or organization: Not on file    Attends meetings of clubs or organizations: Not on file    Relationship status: Not on file  . Intimate partner violence    Fear of current or ex partner: Not on file    Emotionally abused: Not on file    Physically abused: Not on file    Forced sexual activity: Not on file  Other Topics Concern  . Not on file  Social History Narrative   Lives alone    Caffeine use: 2 cups daily    Family History  Problem Relation Age of Onset  . Hypertension  Mother   . Arthritis Mother   . Fibromyalgia Mother   . Hyperlipidemia Mother   . Diabetes Father   . Diabetes Paternal Grandmother   . Stroke Paternal Grandmother   . Kidney disease Paternal Grandmother   . Heart attack Paternal Grandmother   . Hypertension Paternal Grandmother   . Hyperlipidemia Paternal Grandmother   . Diabetes Paternal Grandfather   . Alcohol abuse Maternal Grandfather   . Cancer Maternal Grandfather        BRAIN    Past Medical History:  Diagnosis Date  . Allergy   . Anxiety   . Hematuria   . Hip dysplasia   . Hyperlipidemia   . Hypertension   . Migraine   . NASH (nonalcoholic steatohepatitis)   . Osteoporosis   . Scoliosis    minimal  . Thyroid disease   . Turner syndrome   . Vitamin D deficiency     Past Surgical History:  Procedure Laterality Date  . BILATERAL OOPHORECTOMY    . HERNIA REPAIR     x2  . HIP SURGERY    . KYPHOPLASTY  08/2016   L2  . TONSILLECTOMY    . TYMPANOSTOMY TUBE PLACEMENT      Current Outpatient Medications  Medication Sig Dispense Refill  . albuterol (PROVENTIL HFA;VENTOLIN HFA) 108 (90 Base) MCG/ACT inhaler Inhale 2 puffs into the lungs every 6 (six) hours as needed for wheezing. 1 Inhaler 12  . amLODipine (NORVASC) 5 MG tablet Take 1 tablet (5 mg total) by mouth daily. 90 tablet 6  . atorvastatin (LIPITOR) 80 MG tablet TAKE 1 TABLET BY MOUTH EVERY DAY *NEED LAB WORK 15 tablet 0  . dicyclomine (BENTYL) 10 MG capsule Take 1-2 capsules (10-20 mg total) by mouth 4 (four) times daily -  before meals and at bedtime. As needed for abdominal spasm 90 capsule 1  . estradiol (CLIMARA - DOSED IN MG/24 HR) 0.1 mg/24hr patch PLACE 1 PATCH (0.1 MG TOTAL) ONTO THE SKIN ONCE A WEEK. 12 patch 0  . fexofenadine (ALLEGRA) 60 MG tablet Take 60 mg by mouth daily.      . Fluticasone-Salmeterol (ADVAIR DISKUS) 100-50 MCG/DOSE AEPB Inhale 1 puff into the lungs 2 (two) times daily. Inhale 1 puff into lungs bid 60 each 3  . levothyroxine  (SYNTHROID, LEVOTHROID) 112 MCG tablet TAKE 1 TABLET (112 MCG TOTAL) BY MOUTH DAILY BEFORE BREAKFAST. NEEDS LABS 30 tablet 0  . loperamide (IMODIUM A-D) 2 MG tablet Take 1-2 tablets (2-4 mg total) by mouth 3 (three) times daily as needed (prior to meals to rpevent diarrhea). 90 tablet 1  . Multiple Vitamin (MULTIVITAMIN) tablet Take 1 tablet by mouth daily.      . naratriptan (AMERGE) 2.5 MG tablet TAKE 1 TABLET AS NEEDED  MIGRAINE MAY REPEAT AFTER 2 HOURS MAX 2 PER 24 HOURS 12 tablet 11  . nortriptyline (PAMELOR) 50 MG capsule TAKE 1 CAPSULE BY MOUTH AT BEDTIME. 90 capsule 6  . omeprazole (PRILOSEC) 40 MG capsule TAKE 1 CAPSULE TWICE A DAY 30 capsule 0  . ondansetron (ZOFRAN-ODT) 8 MG disintegrating tablet Take 1 tablet (8 mg total) by mouth every 8 (eight) hours as needed (prior to meals to rpevent diarrhea). 90 tablet 1  . promethazine (PHENERGAN) 12.5 MG tablet Take 1 tablet (12.5 mg total) by mouth every 6 (six) hours as needed for nausea or vomiting. 30 tablet 11  . tizanidine (ZANAFLEX) 2 MG capsule Take 1 capsule (2 mg total) by mouth 3 (three) times daily as needed for muscle spasms. 60 capsule 11  . Vitamin D, Ergocalciferol, (DRISDOL) 1.25 MG (50000 UT) CAPS capsule Take 1 capsule (50,000 Units total) by mouth every 7 (seven) days. 12 capsule 1  . cyclobenzaprine (FLEXERIL) 10 MG tablet Take 1 tablet (10 mg total) by mouth at bedtime. 30 tablet 6  . Fremanezumab-vfrm (AJOVY) 225 MG/1.5ML SOAJ Inject 1.5 mLs into the skin every 30 (thirty) days. 1 pen 11   No current facility-administered medications for this visit.     Allergies as of 12/13/2018 - Review Complete 06/14/2018  Allergen Reaction Noted  . Tape  06/14/2018  . Prednisone  07/22/2012    Vitals: BP 127/81 (BP Location: Right Arm, Patient Position: Sitting)   Pulse (!) 118   Temp 97.8 F (36.6 C) Comment: taken by check-in staff  Ht 5\' 2"  (1.575 m)   Wt 162 lb (73.5 kg)   BMI 29.63 kg/m  Last Weight:  Wt Readings  from Last 1 Encounters:  12/13/18 162 lb (73.5 kg)   Last Height:   Ht Readings from Last 1 Encounters:  12/13/18 5\' 2"  (1.575 m)   Physical exam: Exam: Gen: NAD, conversant, well nourised, obese, well groomed                     CV: RRR, no MRG. No Carotid Bruits. No peripheral edema, warm, nontender Eyes: Conjunctivae clear without exudates or hemorrhage  Neuro: Detailed Neurologic Exam  Speech:    Speech is normal; fluent and spontaneous with normal comprehension.  Cognition:    The patient is oriented to person, place, and time;     recent and remote memory intact;     language fluent;     normal attention, concentration,     fund of knowledge Cranial Nerves:    The pupils are equal, round, and reactive to light. The fundi are normal and spontaneous venous pulsations are present. Visual fields are full to finger confrontation. Extraocular movements are intact. Trigeminal sensation is intact and the muscles of mastication are normal. The face is symmetric. The palate elevates in the midline. Hearing intact. Voice is normal. Shoulder shrug is normal. The tongue has normal motion without fasciculations.   Coordination:    Normal finger to nose and heel to shin. Normal rapid alternating movements.   Gait:    Heel-toe and tandem gait are normal.   Motor Observation:    No asymmetry, no atrophy, and no involuntary movements noted. Tone:    Normal muscle tone.    Posture:    Posture is normal. normal erect    Strength:    Strength is V/V in the upper and lower limbs.      Sensation: intact to LT  Assessment/Plan:33 year old female with chronic migraines without aura with status migrainosus not intractable. We have discussed other medications in the past, botoc. We started CGRP and she continues on nortriptyline and he headache have improved by 50% to only half the month (15 days migraine free now).  - In the past ordered DRY NEEDLING for cervical myofascial  pain, she has not gone yet  -Migraines: continue Ajovy and Nortriptyline. Also amlodipine. For acute management try to take triptan medications as early as possible.   -Stopped lexapro, if needed can try effexor or cymbalta as these help with mood and migraine.  - stress and depression: Needs therapy and medical management, recommend therapist who can write prescriptions   - Migraine and musculoskeletal neck pain with cervicalgia: referred to integrative therapies in the past. May consider dry needling, PT, massage, exercise, stress management. Discussed again. She can go to Integrative therapies or [facility] whichever she prefers.Will try flexeril at bedtime.  - Discussed side effect of medications as per patient instructions. Discussed teratogenicity of medications.  Meds ordered this encounter  Medications  . cyclobenzaprine (FLEXERIL) 10 MG tablet    Sig: Take 1 tablet (10 mg total) by mouth at bedtime.    Dispense:  30 tablet    Refill:  6  . promethazine (PHENERGAN) 12.5 MG tablet    Sig: Take 1 tablet (12.5 mg total) by mouth every 6 (six) hours as needed for nausea or vomiting.    Dispense:  30 tablet    Refill:  11  . naratriptan (AMERGE) 2.5 MG tablet    Sig: TAKE 1 TABLET AS NEEDED MIGRAINE MAY REPEAT AFTER 2 HOURS MAX 2 PER 24 HOURS    Dispense:  12 tablet    Refill:  11  . nortriptyline (PAMELOR) 50 MG capsule    Sig: TAKE 1 CAPSULE BY MOUTH AT BEDTIME.    Dispense:  90 capsule    Refill:  6  . amLODipine (NORVASC) 5 MG tablet    Sig: Take 1 tablet (5 mg total) by mouth daily.    Dispense:  90 tablet    Refill:  6  . Fremanezumab-vfrm (AJOVY) 225 MG/1.5ML SOAJ    Sig: Inject 1.5 mLs into the skin every 30 (thirty) days.    Dispense:  1 pen    Refill:  11    A total of 25 minutes was spent face-to-face with this patient. Over half this time was spent on counseling patient on the  1. Chronic migraine without aura without status migrainosus, not intractable   2.  Essential hypertension    diagnosis and different diagnostic and therapeutic options, counseling and coordination of care, risks ans benefits of management, compliance, or risk factor reduction and education.      Naomie DeanAntonia Shaul Trautman, MD  [facility] [address] [place], [place] [id number]  Phone [phone] Fax [phone]  A total of 25 minutes was spent face-to-face with this patient. Over half this time was spent on counseling patient on the  1. Chronic migraine without aura without status migrainosus, not intractable   2. Essential hypertension    and different diagnostic and therapeutic options available.

## 2018-12-29 ENCOUNTER — Other Ambulatory Visit: Payer: Self-pay | Admitting: Neurology

## 2019-01-10 ENCOUNTER — Telehealth: Payer: Self-pay | Admitting: *Deleted

## 2019-01-10 NOTE — Telephone Encounter (Signed)
Received PA request from [employer]rescripts for Ajovy 225 mg. I started a PA on CMM. Key: [id number]. Received this message from plan: Please advise the dispensing pharmacy to contact the [place] at [phone] for assistance.

## 2019-01-13 ENCOUNTER — Telehealth: Payer: Self-pay

## 2019-01-13 NOTE — Telephone Encounter (Signed)
Pending approval for Ajovy 225 mg Key: [id number] PA Case ID: [id number] ICD 10 code: O03.559 and G43.101   I will update once a decision has been made.

## 2019-01-19 NOTE — Telephone Encounter (Signed)
Receive fax that Cristina Zimmerman was denied.I called [facility] at  [phone]. I called to discuss the denial of the PA The customer service rep stated cover my meds did not send all the clinical information over to the pharmacy to review.The PA was done again. Decision will be made in 24 to 48 hours.

## 2019-01-19 NOTE — Telephone Encounter (Signed)
Receive fax that Cristina Zimmerman was approve from 01/19/2019 to 01/19/2020.

## 2019-01-25 ENCOUNTER — Other Ambulatory Visit: Payer: Self-pay | Admitting: Osteopathic Medicine

## 2019-01-25 NOTE — Telephone Encounter (Signed)
OK to refill Last seen 02/2018, let's make sure she has an annual scheduled sometime for this month or next!

## 2019-01-25 NOTE — Telephone Encounter (Signed)
Left a detailed vm msg for pt regarding rx refill sent to local pharmacy. Aware of provider's note. Direct call back info provided for scheduling.

## 2019-01-25 NOTE — Telephone Encounter (Signed)
Requested medication (s) are due for refill today: yes  Requested medication (s) are on the active medication list: yes  Last refill:  11/02/2018  Future visit scheduled: no  Notes to clinic:  Overdue for office visit Review for refill   Requested Prescriptions  Pending Prescriptions Disp Refills   estradiol (CLIMARA - DOSED IN MG/24 HR) 0.1 mg/24hr patch [Pharmacy Med Name: ESTRADIOL TDS 0.1 MG/DAY] 12 patch 0    Sig: PLACE 1 PATCH (0.1 MG TOTAL) ONTO THE SKIN ONCE A WEEK.     OB/GYN:  Estrogens Failed - 01/25/2019  1:09 AM      Failed - Mammogram is up-to-date per Health Maintenance      Failed - Valid encounter within last 12 months    Recent Outpatient Visits          10 months ago Need for influenza vaccination   [facility], Lanelle Bal, DO   1 year ago Epigastric pain   [facility], Rebekah Chesterfield, MD   2 years ago Vertebral compression fracture, initial encounter [facility])   [facility], Rebekah Chesterfield, MD   2 years ago Vertebral compression fracture, initial encounter [facility])   [facility], Rebekah Chesterfield, MD   3 years ago Migraine with aura and without status migrainosus, not intractable   [facility], DO             Passed - Last BP in normal range    BP Readings from Last 1 Encounters:  12/13/18 127/81

## 2019-01-25 NOTE — Telephone Encounter (Signed)
[facility] patch.

## 2019-02-09 DIAGNOSIS — Z0289 Encounter for other administrative examinations: Secondary | ICD-10-CM

## 2019-02-10 ENCOUNTER — Telehealth: Payer: Self-pay | Admitting: *Deleted

## 2019-02-10 NOTE — Telephone Encounter (Signed)
FMLA and Accomodations Request form completed, signed, and sent to medical records for processing.

## 2019-02-14 NOTE — Telephone Encounter (Signed)
Pt form faxed to [facility] [employer] @ [phone]

## 2019-02-16 ENCOUNTER — Other Ambulatory Visit: Payer: Self-pay | Admitting: Neurology

## 2019-02-16 DIAGNOSIS — F4323 Adjustment disorder with mixed anxiety and depressed mood: Secondary | ICD-10-CM

## 2019-03-15 ENCOUNTER — Other Ambulatory Visit: Payer: Self-pay | Admitting: Neurology

## 2019-03-24 ENCOUNTER — Encounter: Payer: Self-pay | Admitting: Osteopathic Medicine

## 2019-03-24 ENCOUNTER — Telehealth (INDEPENDENT_AMBULATORY_CARE_PROVIDER_SITE_OTHER): Payer: 59 | Admitting: Osteopathic Medicine

## 2019-03-24 VITALS — Wt 162.0 lb

## 2019-03-24 DIAGNOSIS — Q969 Turner's syndrome, unspecified: Secondary | ICD-10-CM

## 2019-03-24 DIAGNOSIS — F419 Anxiety disorder, unspecified: Secondary | ICD-10-CM

## 2019-03-24 DIAGNOSIS — E039 Hypothyroidism, unspecified: Secondary | ICD-10-CM | POA: Diagnosis not present

## 2019-03-24 DIAGNOSIS — G43101 Migraine with aura, not intractable, with status migrainosus: Secondary | ICD-10-CM

## 2019-03-24 MED ORDER — MEDROXYPROGESTERONE ACETATE 10 MG PO TABS: 10.0000 mg | ORAL_TABLET | Freq: Every day | ORAL | 3 refills | Status: DC

## 2019-03-24 MED ORDER — OMEPRAZOLE 40 MG PO CPDR: 40.0000 mg | DELAYED_RELEASE_CAPSULE | Freq: Every day | ORAL | 3 refills | Status: DC

## 2019-03-24 MED ORDER — ALPRAZOLAM 0.5 MG PO TABS: 0.2500 mg | ORAL_TABLET | Freq: Two times a day (BID) | ORAL | 0 refills | Status: DC | PRN

## 2019-03-24 MED ORDER — ESTRADIOL 0.1 MG/24HR TD PTWK: 0.1000 mg | MEDICATED_PATCH | TRANSDERMAL | 3 refills | Status: DC

## 2019-03-24 NOTE — Progress Notes (Signed)
Virtual Visit via Video (App used: MyChart) Note  I connected with      Cristina Zimmerman on 03/24/19 at 8:30 by a telemedicine application and verified that I am speaking with the correct person using two identifiers.  Patient is at home I am in office   I discussed the limitations of evaluation and management by telemedicine and the availability of in person appointments. The patient expressed understanding and agreed to proceed.  History of Present Illness: Cristina Zimmerman is a 33 y.o. 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  She has come off of her Lexapro, and noted an improvement in migraines with this, however anxiety has been an issue lately.  Feels she can manage daily/low level anxiety fairly well but occasionally has days of feeling overwhelmed/panic attacks.        Observations/Objective: Wt 162 lb (73.5 kg)   BMI 29.63 kg/m  BP Readings from Last 3 Encounters:  12/13/18 127/81  06/14/18 120/76  03/10/18 122/80   Exam: Normal Speech.  NAD  Lab and Radiology Results No results found for this or any previous visit (from the past 72 hour(s)). No results found.     Assessment and Plan: 33 y.o. 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 Diagnoses of Acquired hypothyroidism, Anxiety, and Migraine with aura and with status migrainosus, not intractable were also pertinent to this visit.   PDMP not reviewed this encounter. Orders Placed This Encounter  Procedures  . CBC  . COMPLETE METABOLIC PANEL WITH GFR  . LIPID SCREENING  . TSH  . Estradiol  . Testosterone, Total, LC/MS/MS   Meds ordered this encounter  Medications  . medroxyPROGESTERone (PROVERA) 10 MG tablet     Sig: Take 1 tablet (10 mg total) by mouth daily. 10 days per month    Dispense:  30 tablet    Refill:  3  . estradiol (CLIMARA - DOSED IN MG/24 HR) 0.1 mg/24hr patch    Sig: Place 1 patch (0.1 mg total) onto the skin once a week.    Dispense:  12 patch    Refill:  3  . omeprazole (PRILOSEC) 40 MG capsule    Sig: Take 1 capsule (40 mg total) by mouth daily.    Dispense:  90 capsule    Refill:  3    No refills. Pt is overdue for f/u w/Provider.  . ALPRAZolam (XANAX) 0.5 MG tablet    Sig: Take 0.5-1 tablets (0.25-0.5 mg total) by mouth 2 (two) times daily as needed for anxiety. Please print on bottle: "30 tablets must last 90 days."    Dispense:  30 tablet    Refill:  0     Follow Up Instructions: Return for RECHECK PENDING LAB RESULTS / ANNUAL IN NEXT 6 MOS - NEED PAP! Marland Kitchen    I discussed the assessment and treatment plan with the patient. The patient was provided an opportunity to ask questions and all were answered. The patient agreed with the plan and demonstrated an understanding of the instructions.   The patient was advised to call back or seek an in-person evaluation if any new concerns, if symptoms worsen or if the condition fails to improve as anticipated.  25  minutes of non-face-to-face time was provided during this encounter.      . . . . . . . . . . . . . Marland Kitchen                   Historical information moved to improve visibility of documentation.  Past Medical History:  Diagnosis Date  . Allergy   . Anxiety   . Hematuria   . Hip dysplasia   . Hyperlipidemia   . Hypertension   . Migraine   . NASH (nonalcoholic steatohepatitis)   . Osteoporosis   . Scoliosis    minimal  . Thyroid disease   . Turner syndrome   . Vitamin D deficiency    Past Surgical History:  Procedure Laterality Date  . BILATERAL OOPHORECTOMY    . HERNIA REPAIR     x2  . HIP SURGERY    . KYPHOPLASTY  08/2016   L2  . TONSILLECTOMY    . TYMPANOSTOMY TUBE  PLACEMENT     Social History   Tobacco Use  . Smoking status: Never Smoker  . Smokeless tobacco: Never Used  Substance Use Topics  . Alcohol use: Yes    Comment: socially   family history includes Alcohol abuse in her maternal grandfather; Arthritis in her mother; Cancer in her maternal grandfather; Diabetes in her father, paternal grandfather, and paternal grandmother; Fibromyalgia in her mother; Heart attack in her paternal grandmother; Hyperlipidemia in her mother and paternal grandmother; Hypertension in her mother and paternal grandmother; Kidney disease in her paternal grandmother; Stroke in her paternal grandmother.  Medications: Current Outpatient Medications  Medication Sig Dispense Refill  . albuterol (PROVENTIL HFA;VENTOLIN HFA) 108 (90 Base) MCG/ACT inhaler Inhale 2 puffs into the lungs every 6 (six) hours as needed for wheezing. 1 Inhaler 12  . amLODipine (NORVASC) 5 MG tablet Take 1 tablet (5 mg total) by mouth daily. 90 tablet 6  . atorvastatin (LIPITOR) 80 MG tablet TAKE 1 TABLET BY MOUTH EVERY DAY *NEED LAB WORK 15 tablet 0  . cyclobenzaprine (FLEXERIL) 10 MG tablet Take 1 tablet (10 mg total) by mouth at bedtime. 30 tablet 6  . dicyclomine (BENTYL) 10 MG capsule Take 1-2 capsules (10-20 mg total) by mouth 4 (four) times daily -  before meals and at bedtime. As needed for abdominal spasm 90 capsule 1  . estradiol (CLIMARA - DOSED IN MG/24 HR) 0.1 mg/24hr patch Place 1 patch (0.1 mg total) onto the skin once a week. 12 patch 3  . fexofenadine (ALLEGRA) 60 MG tablet Take 60 mg by mouth daily.      . Fluticasone-Salmeterol (ADVAIR DISKUS) 100-50 MCG/DOSE AEPB Inhale 1 puff into the lungs 2 (two) times daily. Inhale 1 puff into lungs bid 60 each 3  . Fremanezumab-vfrm (AJOVY) 225 MG/1.5ML SOAJ Inject 1.5 mLs into the skin every 30 (thirty) days. 1 pen 11  . levothyroxine (SYNTHROID, LEVOTHROID) 112 MCG tablet TAKE 1 TABLET (112 MCG TOTAL) BY MOUTH DAILY BEFORE BREAKFAST. NEEDS  LABS 30 tablet 0  . loperamide (IMODIUM A-D) 2 MG tablet Take 1-2 tablets (2-4 mg total) by mouth 3 (three) times daily as needed (prior to meals to rpevent diarrhea). 90 tablet 1  . Multiple Vitamin (MULTIVITAMIN) tablet Take 1 tablet by mouth daily.      . naratriptan (AMERGE) 2.5 MG tablet TAKE 1 TABLET AS NEEDED MIGRAINE MAY REPEAT AFTER 2 HOURS MAX 2 PER 24 HOURS 12 tablet 11  . nortriptyline (PAMELOR) 50 MG capsule TAKE  1 CAPSULE AT BEDTIME 90 capsule 6  . omeprazole (PRILOSEC) 40 MG capsule Take 1 capsule (40 mg total) by mouth daily. 90 capsule 3  . ondansetron (ZOFRAN-ODT) 8 MG disintegrating tablet Take 1 tablet (8 mg total) by mouth every 8 (eight) hours as needed (prior to meals to rpevent diarrhea). 90 tablet 1  . promethazine (PHENERGAN) 12.5 MG tablet Take 1 tablet (12.5 mg total) by mouth every 6 (six) hours as needed for nausea or vomiting. 30 tablet 11  . tizanidine (ZANAFLEX) 2 MG capsule Take 1 capsule (2 mg total) by mouth 3 (three) times daily as needed for muscle spasms. 60 capsule 11  . Vitamin D, Ergocalciferol, (DRISDOL) 1.25 MG (50000 UT) CAPS capsule Take 1 capsule (50,000 Units total) by mouth every 7 (seven) days. 12 capsule 1  . ALPRAZolam (XANAX) 0.5 MG tablet Take 0.5-1 tablets (0.25-0.5 mg total) by mouth 2 (two) times daily as needed for anxiety. Please print on bottle: "30 tablets must last 90 days." 30 tablet 0  . medroxyPROGESTERone (PROVERA) 10 MG tablet Take 1 tablet (10 mg total) by mouth daily. 10 days per month 30 tablet 3   No current facility-administered medications for this visit.    Allergies  Allergen Reactions  . Tape   . Prednisone     PO prednisone only- Jittery, anxious, elevated blood pressure.   Pt does "okay" with steroid injections Jittery intolerance

## 2019-04-11 ENCOUNTER — Other Ambulatory Visit: Payer: Self-pay | Admitting: Neurology

## 2019-07-18 DIAGNOSIS — Z0289 Encounter for other administrative examinations: Secondary | ICD-10-CM

## 2019-07-19 ENCOUNTER — Telehealth: Payer: Self-pay | Admitting: *Deleted

## 2019-07-19 NOTE — Telephone Encounter (Signed)
FMLA & Accommodation forms completed, signed, and sent to medical records for processing. Pt has pending f/u Aug 2021.

## 2019-07-27 LAB — COMPLETE METABOLIC PANEL WITH GFR
AG Ratio: 1.8 (calc) (ref 1.0–2.5)
ALT: 31 U/L — ABNORMAL HIGH (ref 6–29)
AST: 27 U/L (ref 10–30)
Albumin: 4.4 g/dL (ref 3.6–5.1)
Alkaline phosphatase (APISO): 86 U/L (ref 31–125)
BUN: 13 mg/dL (ref 7–25)
CO2: 26 mmol/L (ref 20–32)
Calcium: 9.2 mg/dL (ref 8.6–10.2)
Chloride: 104 mmol/L (ref 98–110)
Creat: 0.8 mg/dL (ref 0.50–1.10)
GFR, Est African American: 111 mL/min/{1.73_m2} (ref >=60)
GFR, Est Non African American: 96 mL/min/{1.73_m2} (ref >=60)
Globulin: 2.5 g/dL (calc) (ref 1.9–3.7)
Glucose, Bld: 89 mg/dL (ref 65–99)
Potassium: 4.1 mmol/L (ref 3.5–5.3)
Sodium: 138 mmol/L (ref 135–146)
Total Bilirubin: 0.6 mg/dL (ref 0.2–1.2)
Total Protein: 6.9 g/dL (ref 6.1–8.1)

## 2019-07-27 LAB — CBC
HCT: 38.6 % (ref 35.0–45.0)
Hemoglobin: 12.8 g/dL (ref 11.7–15.5)
MCH: 27.6 pg (ref 27.0–33.0)
MCHC: 33.2 g/dL (ref 32.0–36.0)
MCV: 83.4 fL (ref 80.0–100.0)
MPV: 9.3 fL (ref 7.5–12.5)
Platelets: 259 10*3/uL (ref 140–400)
RBC: 4.63 10*6/uL (ref 3.80–5.10)
RDW: 15.5 % — ABNORMAL HIGH (ref 11.0–15.0)
WBC: 6.3 10*3/uL (ref 3.8–10.8)

## 2019-07-27 LAB — LIPID PANEL
Cholesterol: 281 mg/dL — ABNORMAL HIGH (ref <200)
HDL: 44 mg/dL — ABNORMAL LOW (ref >=50)
LDL Cholesterol (Calc): 212 mg/dL (calc) — ABNORMAL HIGH
Non-HDL Cholesterol (Calc): 237 mg/dL (calc) — ABNORMAL HIGH (ref <130)
Total CHOL/HDL Ratio: 6.4 (calc) — ABNORMAL HIGH (ref <5.0)
Triglycerides: 114 mg/dL (ref <150)

## 2019-07-27 LAB — TESTOSTERONE, TOTAL, LC/MS/MS: Testosterone, Total, LC-MS-MS: 8 ng/dL (ref 2–45)

## 2019-07-27 LAB — ESTRADIOL: Estradiol: 80 pg/mL

## 2019-07-27 LAB — TSH: TSH: 15.69 mIU/L — ABNORMAL HIGH

## 2019-07-28 ENCOUNTER — Other Ambulatory Visit: Payer: Self-pay | Admitting: Osteopathic Medicine

## 2019-07-28 DIAGNOSIS — E039 Hypothyroidism, unspecified: Secondary | ICD-10-CM

## 2019-08-01 ENCOUNTER — Other Ambulatory Visit: Payer: Self-pay | Admitting: Osteopathic Medicine

## 2019-08-01 DIAGNOSIS — E039 Hypothyroidism, unspecified: Secondary | ICD-10-CM

## 2019-08-01 DIAGNOSIS — E7849 Other hyperlipidemia: Secondary | ICD-10-CM

## 2019-08-01 MED ORDER — LEVOTHYROXINE SODIUM 112 MCG PO TABS: 112.0000 ug | ORAL_TABLET | Freq: Every day | ORAL | 0 refills | Status: DC

## 2019-08-01 MED ORDER — ATORVASTATIN CALCIUM 80 MG PO TABS: 80.0000 mg | ORAL_TABLET | Freq: Every day | ORAL | 3 refills | Status: DC

## 2019-08-01 NOTE — Progress Notes (Signed)
Opened in error

## 2019-10-03 ENCOUNTER — Other Ambulatory Visit: Payer: Self-pay | Admitting: Neurology

## 2019-10-03 DIAGNOSIS — G43709 Chronic migraine without aura, not intractable, without status migrainosus: Secondary | ICD-10-CM

## 2019-10-04 ENCOUNTER — Telehealth: Payer: Self-pay | Admitting: Neurology

## 2019-10-04 MED ORDER — AJOVY 225 MG/1.5ML ~~LOC~~ SOAJ: 1.5000 mL | SUBCUTANEOUS | 2 refills | Status: DC

## 2019-10-04 NOTE — Telephone Encounter (Signed)
Pt is needing a refill on her Fremanezumab-vfrm (AJOVY) 225 MG/1.5ML SOAJ sent to the [facility] on [address] [place]

## 2019-10-04 NOTE — Telephone Encounter (Signed)
I sent refills to [facility] to get pt through appt in August.

## 2019-10-07 ENCOUNTER — Other Ambulatory Visit: Payer: Self-pay | Admitting: Neurology

## 2019-10-07 DIAGNOSIS — G43709 Chronic migraine without aura, not intractable, without status migrainosus: Secondary | ICD-10-CM

## 2019-10-11 ENCOUNTER — Other Ambulatory Visit: Payer: Self-pay | Admitting: Neurology

## 2019-10-22 ENCOUNTER — Other Ambulatory Visit: Payer: Self-pay | Admitting: Osteopathic Medicine

## 2019-10-22 DIAGNOSIS — E039 Hypothyroidism, unspecified: Secondary | ICD-10-CM

## 2019-12-14 ENCOUNTER — Ambulatory Visit (INDEPENDENT_AMBULATORY_CARE_PROVIDER_SITE_OTHER): Payer: No Typology Code available for payment source | Admitting: Neurology

## 2019-12-14 ENCOUNTER — Encounter: Payer: Self-pay | Admitting: Neurology

## 2019-12-14 VITALS — BP 130/80 | HR 121 | Ht 62.0 in | Wt 171.0 lb

## 2019-12-14 DIAGNOSIS — I10 Essential (primary) hypertension: Secondary | ICD-10-CM

## 2019-12-14 DIAGNOSIS — G43701 Chronic migraine without aura, not intractable, with status migrainosus: Secondary | ICD-10-CM

## 2019-12-14 MED ORDER — PROPRANOLOL HCL 10 MG PO TABS: 10.0000 mg | ORAL_TABLET | Freq: Three times a day (TID) | ORAL | 6 refills | Status: DC

## 2019-12-14 MED ORDER — NARATRIPTAN HCL 2.5 MG PO TABS
ORAL_TABLET | ORAL | 11 refills | Status: DC
Start: 2019-12-14 — End: 2020-12-24

## 2019-12-14 MED ORDER — TIZANIDINE HCL 2 MG PO CAPS: 2.0000 mg | ORAL_CAPSULE | Freq: Three times a day (TID) | ORAL | 11 refills | Status: DC | PRN

## 2019-12-14 MED ORDER — NURTEC 75 MG PO TBDP: 75.0000 mg | ORAL_TABLET | Freq: Every day | ORAL | 0 refills | Status: DC | PRN

## 2019-12-14 MED ORDER — AMLODIPINE BESYLATE 5 MG PO TABS: 5.0000 mg | ORAL_TABLET | Freq: Every day | ORAL | 6 refills | Status: DC

## 2019-12-14 MED ORDER — PROMETHAZINE HCL 12.5 MG PO TABS
12.5000 mg | ORAL_TABLET | Freq: Four times a day (QID) | ORAL | 11 refills | Status: DC | PRN
Start: 2019-12-14 — End: 2021-04-25

## 2019-12-14 NOTE — Patient Instructions (Signed)
- Change Ajovy to emgality - she prefers to stay on Ajovy for now - Continue Amerge as needed for migraines - During the 10 days of headaches with progesterone take Nurtec - Blurry vision doesn't improve MRI brain (last one was in 2017 and normal) - Continue follow up with Ophthalmology - Can try propranolol next as preventative may help with her anxiety as well (also the glaucoma drops help and those are often beta blockers). Watch for hypotension and low pulse.She says Xanax helps her migraines so a lot of this may be good for migraines and anxiety.  - Anxiety recommend follow up -Migraines: continue Ajovy and Nortriptyline. Also amlodipine. For acute management try to take triptan medications as early as possible. Can take Nurtec with Naratriptan - Timolol acutely for bad migraine attacks with eye pressure -Stopped lexapro, if needed can try effexor or cymbalta as these help with mood and migraine  Rimegepant oral dissolving tablet What is this medicine? RIMEGEPANT (ri ME je pant) is used to treat migraine headaches with or without aura. An aura is a strange feeling or visual disturbance that warns you of an attack. It is not used to prevent migraines. This medicine may be used for other purposes; ask your health care provider or pharmacist if you have questions. COMMON BRAND NAME(S): NURTEC ODT What should I tell my health care provider before I take this medicine? They need to know if you have any of these conditions:  kidney disease  liver disease  an unusual or allergic reaction to rimegepant, other medicines, foods, dyes, or preservatives  pregnant or trying to get pregnant  breast-feeding How should I use this medicine? Take the medicine by mouth. Follow the directions on the prescription label. Leave the tablet in the sealed blister pack until you are ready to take it. With dry hands, open the blister and gently remove the tablet. If the tablet breaks or crumbles, throw it  away and take a new tablet out of the blister pack. Place the tablet in the mouth and allow it to dissolve, and then swallow. Do not cut, crush, or chew this medicine. You do not need water to take this medicine. Talk to your pediatrician about the use of this medicine in children. Special care may be needed. Overdosage: If you think you have taken too much of this medicine contact a poison control center or emergency room at once. NOTE: This medicine is only for you. Do not share this medicine with others. What if I miss a dose? This does not apply. This medicine is not for regular use. What may interact with this medicine? This medicine may interact with the following medications:  certain medicines for fungal infections like fluconazole, itraconazole  rifampin This list may not describe all possible interactions. Give your health care provider a list of all the medicines, herbs, non-prescription drugs, or dietary supplements you use. Also tell them if you smoke, drink alcohol, or use illegal drugs. Some items may interact with your medicine. What should I watch for while using this medicine? Visit your health care professional for regular checks on your progress. Tell your health care professional if your symptoms do not start to get better or if they get worse. What side effects may I notice from receiving this medicine? Side effects that you should report to your doctor or health care professional as soon as possible:  allergic reactions like skin rash, itching or hives; swelling of the face, lips, or tongue Side effects that  usually do not require medical attention (report these to your doctor or health care professional if they continue or are bothersome):  nausea This list may not describe all possible side effects. Call your doctor for medical advice about side effects. You may report side effects to FDA at 1-800-FDA-1088. Where should I keep my medicine? Keep out of the reach of  children. Store at room temperature between 15 and 30 degrees C (59 and 86 degrees F). Throw away any unused medicine after the expiration date. NOTE: This sheet is a summary. It may not cover all possible information. If you have questions about this medicine, talk to your doctor, pharmacist, or health care provider.  2020 Elsevier/Gold Standard (2018-06-21 00:21:31) Propranolol Tablets What is this medicine? PROPRANOLOL (proe PRAN oh lole) is a beta blocker. It decreases the amount of work your heart has to do and helps your heart beat regularly. It treats high blood pressure and/or prevent chest pain (also called angina). It is also used after a heart attack to prevent a second one. This medicine may be used for other purposes; ask your health care provider or pharmacist if you have questions. COMMON BRAND NAME(S): Inderal What should I tell my health care provider before I take this medicine? They need to know if you have any of these conditions:  circulation problems or blood vessel disease  diabetes  history of heart attack or heart disease, vasospastic angina  kidney disease  liver disease  lung or breathing disease, like asthma or emphysema  pheochromocytoma  slow heart rate  thyroid disease  an unusual or allergic reaction to propranolol, other beta-blockers, medicines, foods, dyes, or preservatives  pregnant or trying to get pregnant  breast-feeding How should I use this medicine? Take this drug by mouth. Take it as directed on the prescription label at the same time every day. Keep taking it unless your health care provider tells you to stop. Talk to your health care provider about the use of this drug in children. Special care may be needed. Overdosage: If you think you have taken too much of this medicine contact a poison control center or emergency room at once. NOTE: This medicine is only for you. Do not share this medicine with others. What if I miss a  dose? If you miss a dose, take it as soon as you can. If it is almost time for your next dose, take only that dose. Do not take double or extra doses. What may interact with this medicine? Do not take this medicine with any of the following medications:  feverfew  phenothiazines like chlorpromazine, mesoridazine, prochlorperazine, thioridazine This medicine may also interact with the following medications:  aluminum hydroxide gel  antipyrine  antiviral medicines for HIV or AIDS  barbiturates like phenobarbital  certain medicines for blood pressure, heart disease, irregular heart beat  cimetidine  ciprofloxacin  diazepam  fluconazole  haloperidol  isoniazid  medicines for cholesterol like cholestyramine or colestipol  medicines for mental depression  medicines for migraine headache like almotriptan, eletriptan, frovatriptan, naratriptan, rizatriptan, sumatriptan, zolmitriptan  NSAIDs, medicines for pain and inflammation, like ibuprofen or naproxen  phenytoin  rifampin  teniposide  theophylline  thyroid medicines  tolbutamide  warfarin  zileuton This list may not describe all possible interactions. Give your health care provider a list of all the medicines, herbs, non-prescription drugs, or dietary supplements you use. Also tell them if you smoke, drink alcohol, or use illegal drugs. Some items may interact with your medicine.  What should I watch for while using this medicine? Visit your doctor or health care professional for regular check ups. Check your blood pressure and pulse rate regularly. Ask your health care professional what your blood pressure and pulse rate should be, and when you should contact them. You may get drowsy or dizzy. Do not drive, use machinery, or do anything that needs mental alertness until you know how this drug affects you. Do not stand or sit up quickly, especially if you are an older patient. This reduces the risk of dizzy or  fainting spells. Alcohol can make you more drowsy and dizzy. Avoid alcoholic drinks. This medicine may increase blood sugar. Ask your healthcare provider if changes in diet or medicines are needed if you have diabetes. Do not treat yourself for coughs, colds, or pain while you are taking this medicine without asking your doctor or health care professional for advice. Some ingredients may increase your blood pressure. What side effects may I notice from receiving this medicine? Side effects that you should report to your doctor or health care professional as soon as possible:  allergic reactions like skin rash, itching or hives, swelling of the face, lips, or tongue  breathing problems  cold hands or feet  difficulty sleeping, nightmares  dry peeling skin  hallucinations  muscle cramps or weakness   signs and symptoms of high blood sugar such as being more thirsty or hungry or having to urinate more than normal. You may also feel very tired or have blurry vision.  slow heart rate  swelling of the legs and ankles  vomiting Side effects that usually do not require medical attention (report to your doctor or health care professional if they continue or are bothersome):  change in sex drive or performance  diarrhea  dry sore eyes  hair loss  nausea  weak or tired This list may not describe all possible side effects. Call your doctor for medical advice about side effects. You may report side effects to FDA at 1-800-FDA-1088. Where should I keep my medicine? Keep out of the reach of children and pets. Store at room temperature between 20 and 25 degrees C (68 and 77 degrees F). Protect from light. Throw away any unused drug after the expiration date. NOTE: This sheet is a summary. It may not cover all possible information. If you have questions about this medicine, talk to your doctor, pharmacist, or health care provider.  2020 Elsevier/Gold Standard (2018-11-12 19:25:51)

## 2019-12-14 NOTE — Progress Notes (Signed)
[facility]    Provider:  Dr Jaynee Eagles Referring Provider: Emeterio Reeve, DO Primary Care Physician:  Emeterio Reeve, DO  CC: Migraines  On Ajovy 12/14/2019: 34 year old here for follow up of migraines.  She feels like now it is wearing off. She is still improved but having more instances of migraines. She was doing very well, she is on progesterone and every time she takes it she goes into a full-blown attack. The other days she is fine, she takes the progesterone 10x a month and otherwise she can go weeks without a headache. She is still having aura, her eyes are blurry, she got a new prescription. The blurriness is with headache and without headache, both eyes, more pain on the right side. Glaucoma drops helps. She has a blur screen filter. She is having a lot of anxiety as well.  Not using all amerge in a month but using it more headache 2-3x a week. Vision every day or every other day.  Interval history 12/13/2018: Here for follow up of migraines. She has been taking Ajovy for 3-4 months, she missed it last month by mistake and headaches worsened. Migraines have been better on the Ajovy. Also she noticed worsening of headaches when she was not on the Nortriptyline so she continues to take it. She changed her hormone patch and she feels better on the patch she is on now. Now half the month headache free. Stay on nortriptyline, she did see a difference when she was not taking it. Migraines has changed a little to the back of her head.  Feels better off lexapro as far as mood and migraines. If needed discussed we can try cymbalta or effexor as these are good for mood and better for migraines.   Interval history 06/14/2018:  She has been worsening with migraine frequency. She is having daily headaches. At least 20 are migrainous a month. Discussed trying the new medications CGRP. She does not have ovaries. She has IBS so will not give Aimovig, can try Ajovy or emgality. Also  discussed her depression and anxiety which is worsening, recommend therapy and seeing someone who can help with both therapy and medication management.  0 Interval history 12/30/2016: Patient is here for follow-up of chronic migraines. Topiramate helped worsen depression. Other medications that would not impact her depression would be Depakote, SNRIs like Cymbalta and Effexor, TCAs or gabapentin. We decided to try nortriptyline. She reported depression and hair loss with the Topamax. Propranolol can cause worsening depression. She is already on Lexapro so hesitate to try Cymbalta or Effexor. Referred her to physical therapy as well as last appointment. Migraines are improved. She takes 9 naratriptan in a month. She has 16 headache days a month and 12 are migrainous. Ongoing for at least a year. They can last up to 2 weeks.  Headaches are on the right side, pounding, throbbing, and behind the eye with nausea, light and sound sensitivity an smell triggers.No medication overuse. Migraines without aura. She is sleeping well. She is managing stress.   Reglan, Benadryl, Toradol., Topiramate, nortriptyline, norvasc, Zanaflex, phenergan  Interval history: Topiramate helped but has worsened depression. Other medications that can be used that will not impact her depression would be depakote, SNRIs (cymbalta, effexor), TCAs, Gabapentin. She states considerable improvement on the topamax. This is why she hasn't wanted to start it. Patient's mother is here provides much information as is the patient has been extremely depressed since starting Topamax. Patient also reports hair loss. Denies  any thoughts of hurting herself. Topamax can cause worsening depression. Today we discussed migraine management including all the different classes of medications that we can use. Epilepsy medications, Depakote is a medication that could be used in this particular case, patient has Turner syndrome and infertility still discussed  teratogenicity Depakote. Anti-epilepsy medications may cause bone loss and patient has osteoporosis and she worries about this. I blood pressure medications such as propranolol can cause worsening depression. Could try Cymbalta and Effexor but she is already on Lexapro which I don't want to change and putting her on both risks serotonin syndrome. Tricyclic antidepressant is something that we discussed today, it also increases serotonin but less likely to cause serotonin syndrome. EKG performed in the office showed QTC of 409 which is normal. Discussed serotonin syndrome. Will add nortriptyline which will also help with her insomnia. Had a long discussion about migraine medication, migraine pathophysiology. She also has a lot of neck pain, neck tightness, this can worsen her headaches are cause her headaches.  QHU:TMLYY Stringeris a 34 y.o.femalehere as a referral from Dr. Orland Dec migraines. Past medical history of hypertension, high cholesterol, migraine, depression, anxiety, hypothyroidism, hyperlipidemia, nonalcoholic fatty liver, Turner's syndrome, asthma, rickets.Started at the age of 54. Mother is here and provides information as well. Mother has migraines. She has been to the emergency room multiple times. No inciting events, they just started at the age of 21, no head trauma. She has had a migraine last up to 2 weeks and went to the ED recently. Slowly worsening, progressive increase in frequency and severity. Only sleep and dark rooms help, recently naratriptan is helping her insurance will pay for it and she now has Imitrex. She has to wear dark glasses at the ofice. Recently she had left arm numbness in association with migraine, numbness, couldn't feel her arm and left facial numbness. She had facial droop with the headache (showed me a picture exhibiting left facial lower weakness). She has been to the ED and it has happened several times in the last several months. She often does not have an  aura but also has an aura, loses vision, sparkles, shimmering. Her right side has also been numb. Headaches are on the right side, pounding, throbbing, and behind the eye. She has 20 headache days out of the month and at least 1/2 are migrainous for the last 6 monthsand usually last all day if not longer. She has sensitivity to smells, nausea and vomiting. She also experiencedconfusion and memory changes during these episodes. No other focal neurologic deficits or complaints. Mother has migraines.  Reviewed notes, labs and imaging from outside physicians, which showed:   Patient has a history of migraine. Medications tried include: Reglan, Benadryl, Toradol.  BUN 11, creatinine 0.75 these labs drawn August 2017.   Personally reviewed MRI/MRAof the brain and agree with the following: IMPRESSION: 1. Normal MRI of the brain. 2. Normal variant MRA circle of Willis without significant proximal stenosis, aneurysm, or branch vessel occlusion.  CBC normal, CMP with elevated AST and ALT otherwise normal both drawn 11/28/2015.  Review of Systems: Patient complains of symptoms per HPI as well as the following symptoms: headache, dizziness, anxiety. Pertinent negatives per HPI. All others negative.   Social History   Socioeconomic History   Marital status: Single    Spouse name: Not on file   Number of children: Not on file   Years of education: Not on file   Highest education level: Some college, no degree  Occupational History  Not on file  Tobacco Use   Smoking status: Never Smoker   Smokeless tobacco: Never Used  Vaping Use   Vaping Use: Never used  Substance and Sexual Activity   Alcohol use: Not Currently    Comment: socially   Drug use: No   Sexual activity: Not on file  Other Topics Concern   Not on file  Social History Narrative   Lives alone    Caffeine use: 2 cups daily   Social Determinants of Health   Financial Resource Strain:    Difficulty  of Paying Living Expenses: Not on file  Food Insecurity:    Worried About [place] [facility] in the Last Year: Not on file   Ran Out of Food in the Last Year: Not on file  Transportation Needs:    Lack of Transportation (Medical): Not on file   Lack of Transportation (Non-Medical): Not on file  Physical Activity:    Days of Exercise per Week: Not on file   Minutes of Exercise per Session: Not on file  Stress:    Feeling of Stress : Not on file  Social Connections:    Frequency of Communication with Friends and Family: Not on file   Frequency of Social Gatherings with Friends and Family: Not on file   Attends Religious Services: Not on file   Active Member of Clubs or Organizations: Not on file   Attends Archivist Meetings: Not on file   Marital Status: Not on file  Intimate Partner Violence:    Fear of Current or Ex-Partner: Not on file   Emotionally Abused: Not on file   Physically Abused: Not on file   Sexually Abused: Not on file    Family History  Problem Relation Age of Onset   Hypertension Mother    Arthritis Mother    Fibromyalgia Mother    Hyperlipidemia Mother    Diabetes Father    Diabetes Paternal Grandmother    Stroke Paternal Grandmother    Kidney disease Paternal Grandmother    Heart attack Paternal Grandmother    Hypertension Paternal Grandmother    Hyperlipidemia Paternal Grandmother    Diabetes Paternal Grandfather    Alcohol abuse Maternal Grandfather    Cancer Maternal Grandfather        BRAIN    Past Medical History:  Diagnosis Date   Allergy    Anxiety    Hematuria    Hip dysplasia    Hyperlipidemia    Hypertension    Migraine    NASH (nonalcoholic steatohepatitis)    Osteoporosis    Scoliosis    minimal   Thyroid disease    Turner syndrome    Vitamin D deficiency     Past Surgical History:  Procedure Laterality Date   BILATERAL OOPHORECTOMY     HERNIA REPAIR     x2    HIP SURGERY     KYPHOPLASTY  08/2016   L2   TONSILLECTOMY     TYMPANOSTOMY TUBE PLACEMENT      Current Outpatient Medications  Medication Sig Dispense Refill   albuterol (PROVENTIL HFA;VENTOLIN HFA) 108 (90 Base) MCG/ACT inhaler Inhale 2 puffs into the lungs every 6 (six) hours as needed for wheezing. 1 Inhaler 12   ALPRAZolam (XANAX) 0.5 MG tablet Take 0.5-1 tablets (0.25-0.5 mg total) by mouth 2 (two) times daily as needed for anxiety. Please print on bottle: "30 tablets must last 90 days." 30 tablet 0   amLODipine (NORVASC) 5 MG tablet Take  1 tablet (5 mg total) by mouth daily. 90 tablet 6   atorvastatin (LIPITOR) 80 MG tablet Take 1 tablet (80 mg total) by mouth daily. 90 tablet 3   estradiol (CLIMARA - DOSED IN MG/24 HR) 0.1 mg/24hr patch Place 1 patch (0.1 mg total) onto the skin once a week. 12 patch 3   fexofenadine (ALLEGRA) 60 MG tablet Take 60 mg by mouth daily.       Fluticasone-Salmeterol (ADVAIR DISKUS) 100-50 MCG/DOSE AEPB Inhale 1 puff into the lungs 2 (two) times daily. Inhale 1 puff into lungs bid 60 each 3   levothyroxine (SYNTHROID) 112 MCG tablet TAKE 1 TABLET EVERY DAY ON EMPTY STOMACH 90 tablet 0   loperamide (IMODIUM A-D) 2 MG tablet Take 1-2 tablets (2-4 mg total) by mouth 3 (three) times daily as needed (prior to meals to rpevent diarrhea). 90 tablet 1   medroxyPROGESTERone (PROVERA) 10 MG tablet Take 1 tablet (10 mg total) by mouth daily. 10 days per month 30 tablet 3   Multiple Vitamin (MULTIVITAMIN) tablet Take 1 tablet by mouth daily.       naratriptan (AMERGE) 2.5 MG tablet TAKE 1 TABLET AS NEEDED MIGRAINE MAY REPEAT AFTER 2 HOURS MAX 2 PER 24 HOURS 12 tablet 11   nortriptyline (PAMELOR) 50 MG capsule TAKE 1 CAPSULE AT BEDTIME 90 capsule 6   omeprazole (PRILOSEC) 40 MG capsule Take 1 capsule (40 mg total) by mouth daily. 90 capsule 3   ondansetron (ZOFRAN-ODT) 8 MG disintegrating tablet Take 1 tablet (8 mg total) by mouth every 8 (eight) hours  as needed (prior to meals to rpevent diarrhea). 90 tablet 1   promethazine (PHENERGAN) 12.5 MG tablet Take 1 tablet (12.5 mg total) by mouth every 6 (six) hours as needed for nausea or vomiting. 30 tablet 11   tizanidine (ZANAFLEX) 2 MG capsule Take 1 capsule (2 mg total) by mouth 3 (three) times daily as needed for muscle spasms. 60 capsule 11   propranolol (INDERAL) 10 MG tablet Take 1 tablet (10 mg total) by mouth 3 (three) times daily. 90 tablet 6   Rimegepant Sulfate (NURTEC) 75 MG TBDP Take 75 mg by mouth daily as needed. For migraines. Take as close to onset of migraine as possible. One daily maximum. 8 tablet 0   No current facility-administered medications for this visit.    Allergies as of 12/14/2019 - Review Complete 12/14/2019  Allergen Reaction Noted   Tape  06/14/2018   Prednisone  07/22/2012    Vitals: BP 130/80 (BP Location: Right Arm, Patient Position: Sitting)    Pulse (!) 121    Ht 5' 2"  (1.575 m)    Wt 171 lb (77.6 kg)    BMI 31.28 kg/m  Last Weight:  Wt Readings from Last 1 Encounters:  12/14/19 171 lb (77.6 kg)   Last Height:   Ht Readings from Last 1 Encounters:  12/14/19 5' 2"  (1.575 m)   Physical exam: Exam: Gen: NAD, conversant, well nourised, obese, well groomed                     CV: RRR, no MRG. No Carotid Bruits. No peripheral edema, warm, nontender Eyes: Conjunctivae clear without exudates or hemorrhage  Neuro: Detailed Neurologic Exam  Speech:    Speech is normal; fluent and spontaneous with normal comprehension.  Cognition:    The patient is oriented to person, place, and time;     recent and remote memory intact;     language fluent;  normal attention, concentration,     fund of knowledge Cranial Nerves:    The pupils are equal, round, and reactive to light. The fundi are normal and spontaneous venous pulsations are present. Visual fields are full to finger confrontation. Extraocular movements are intact. Trigeminal sensation is  intact and the muscles of mastication are normal. The face is symmetric. The palate elevates in the midline. Hearing intact. Voice is normal. Shoulder shrug is normal. The tongue has normal motion without fasciculations.   Motor Observation:    No asymmetry, no atrophy, and no involuntary movements noted. Tone:    Normal muscle tone.    Posture:    Posture is normal. normal erect    Strength:    Strength is V/V in the upper and lower limbs.      Sensation: intact to LT     Reflex Exam:   Assessment/Plan:34 year old female with chronic migraines without aura with status migrainosus not intractable. We have discussed other medications in the past, botox.  We started CGRP and she continues on nortriptyline and he headache have improved by 50% to only half the month (15 days migraine free now). She feels though it is getting worse however with blurry vision and headaches 2-3x a week and blurry vision every other day, also for 10 days out of the month on progesterone gets headaches.  - Change Ajovy to emgality - she prefers to stay on Ajovy for now - Continue Amerge as needed for migraines - During the 10 days of headaches with progesterone take Nurtec - Blurry vision doesn't improve MRI brain (last one was in 2017 and normal) - Continue follow up with Ophthalmology - Can try propranolol next as preventative may help with her anxiety as well (also the glaucoma drops help and those are often beta blockers). Watch for hypertension.She says Xanax helps her migraines so a lot of this may be anxiety.  - Anxiety recommend follow up -Migraines: continue Ajovy and Nortriptyline. Also amlodipine. For acute management try to take triptan medications as early as possible.  - Timolol acutely for bad migraine attacks with eye pressure -Stopped lexapro, if needed can try effexor or cymbalta as these help with mood and migraine. - In the past ordered DRY NEEDLING for cervical myofascial pain, she never  went  - stress and depression: Needs therapy and medical management, recommend therapist who can write prescriptions   - Migraine and musculoskeletal neck pain with cervicalgia: referred to integrative therapies in the past. May consider dry needling, PT, massage, exercise, stress management. Discussed again. She can go to Integrative therapies or [facility] whichever she prefers.Will switch flexeril to tizanidine. at bedtime.  - Discussed side effect of medications as per patient instructions. Discussed teratogenicity of medications.  Meds ordered this encounter  Medications   DISCONTD: tizanidine (ZANAFLEX) 2 MG capsule    Sig: Take 1 capsule (2 mg total) by mouth 3 (three) times daily as needed for muscle spasms.    Dispense:  60 capsule    Refill:  11   propranolol (INDERAL) 10 MG tablet    Sig: Take 1 tablet (10 mg total) by mouth 3 (three) times daily.    Dispense:  90 tablet    Refill:  6   Rimegepant Sulfate (NURTEC) 75 MG TBDP    Sig: Take 75 mg by mouth daily as needed. For migraines. Take as close to onset of migraine as possible. One daily maximum.    Dispense:  8 tablet    Refill:  0   naratriptan (AMERGE) 2.5 MG tablet    Sig: TAKE 1 TABLET AS NEEDED MIGRAINE MAY REPEAT AFTER 2 HOURS MAX 2 PER 24 HOURS    Dispense:  12 tablet    Refill:  11   promethazine (PHENERGAN) 12.5 MG tablet    Sig: Take 1 tablet (12.5 mg total) by mouth every 6 (six) hours as needed for nausea or vomiting.    Dispense:  30 tablet    Refill:  11   tizanidine (ZANAFLEX) 2 MG capsule    Sig: Take 1 capsule (2 mg total) by mouth 3 (three) times daily as needed for muscle spasms.    Dispense:  60 capsule    Refill:  11   amLODipine (NORVASC) 5 MG tablet    Sig: Take 1 tablet (5 mg total) by mouth daily.    Dispense:  90 tablet    Refill:  6    I spent more than 40 minutes of face-to-face and non-face-to-face time with patient on the  1. Chronic migraine without aura with status  migrainosus, not intractable   2. Essential hypertension    diagnosis.  This included previsit chart review, lab review, study review, order entry, electronic health record documentation, patient education on the different diagnostic and therapeutic options, counseling and coordination of care, risks and benefits of management, compliance, or risk factor reduction     Sarina Ill, MD  [facility] [address] [place], [place] [address]  Phone [phone] Fax [phone]  A total of 25 minutes was spent face-to-face with this patient. Over half this time was spent on counseling patient on the  1. Chronic migraine without aura with status migrainosus, not intractable   2. Essential hypertension    and different diagnostic and therapeutic options available.

## 2020-01-12 ENCOUNTER — Other Ambulatory Visit: Payer: Self-pay | Admitting: Neurology

## 2020-02-28 ENCOUNTER — Other Ambulatory Visit: Payer: Self-pay | Admitting: Osteopathic Medicine

## 2020-03-21 ENCOUNTER — Other Ambulatory Visit: Payer: Self-pay | Admitting: Osteopathic Medicine

## 2020-04-03 NOTE — Progress Notes (Deleted)
[facility]    Provider:  Dr Jaynee Eagles Referring Provider: Emeterio Reeve, DO Primary Care Physician:  Emeterio Reeve, DO  CC: Migraines  On Ajovy 12/14/2019: 34 year old here for follow up of migraines.  She feels like now it is wearing off. She is still improved but having more instances of migraines. She was doing very well, she is on progesterone and every time she takes it she goes into a full-blown attack. The other days she is fine, she takes the progesterone 10x a month and otherwise she can go weeks without a headache. She is still having aura, her eyes are blurry, she got a new prescription. The blurriness is with headache and without headache, both eyes, more pain on the right side. Glaucoma drops helps. She has a blur screen filter. She is having a lot of anxiety as well.  Not using all amerge in a month but using it more headache 2-3x a week. Vision every day or every other day.  Interval history 12/13/2018: Here for follow up of migraines. She has been taking Ajovy for 3-4 months, she missed it last month by mistake and headaches worsened. Migraines have been better on the Ajovy. Also she noticed worsening of headaches when she was not on the Nortriptyline so she continues to take it. She changed her hormone patch and she feels better on the patch she is on now. Now half the month headache free. Stay on nortriptyline, she did see a difference when she was not taking it. Migraines has changed a little to the back of her head.  Feels better off lexapro as far as mood and migraines. If needed discussed we can try cymbalta or effexor as these are good for mood and better for migraines.   Interval history 06/14/2018:  She has been worsening with migraine frequency. She is having daily headaches. At least 20 are migrainous a month. Discussed trying the new medications CGRP. She does not have ovaries. She has IBS so will not give Aimovig, can try Ajovy or emgality. Also  discussed her depression and anxiety which is worsening, recommend therapy and seeing someone who can help with both therapy and medication management.  0 Interval history 12/30/2016: Patient is here for follow-up of chronic migraines. Topiramate helped worsen depression. Other medications that would not impact her depression would be Depakote, SNRIs like Cymbalta and Effexor, TCAs or gabapentin. We decided to try nortriptyline. She reported depression and hair loss with the Topamax. Propranolol can cause worsening depression. She is already on Lexapro so hesitate to try Cymbalta or Effexor. Referred her to physical therapy as well as last appointment. Migraines are improved. She takes 9 naratriptan in a month. She has 16 headache days a month and 12 are migrainous. Ongoing for at least a year. They can last up to 2 weeks.  Headaches are on the right side, pounding, throbbing, and behind the eye with nausea, light and sound sensitivity an smell triggers.No medication overuse. Migraines without aura. She is sleeping well. She is managing stress.   Reglan, Benadryl, Toradol., Topiramate, nortriptyline, norvasc, Zanaflex, phenergan  Interval history: Topiramate helped but has worsened depression. Other medications that can be used that will not impact her depression would be depakote, SNRIs (cymbalta, effexor), TCAs, Gabapentin. She states considerable improvement on the topamax. This is why she hasn't wanted to start it. Patient's mother is here provides much information as is the patient has been extremely depressed since starting Topamax. Patient also reports hair loss. Denies  any thoughts of hurting herself. Topamax can cause worsening depression. Today we discussed migraine management including all the different classes of medications that we can use. Epilepsy medications, Depakote is a medication that could be used in this particular case, patient has Turner syndrome and infertility still discussed  teratogenicity Depakote. Anti-epilepsy medications may cause bone loss and patient has osteoporosis and she worries about this. I blood pressure medications such as propranolol can cause worsening depression. Could try Cymbalta and Effexor but she is already on Lexapro which I don't want to change and putting her on both risks serotonin syndrome. Tricyclic antidepressant is something that we discussed today, it also increases serotonin but less likely to cause serotonin syndrome. EKG performed in the office showed QTC of 409 which is normal. Discussed serotonin syndrome. Will add nortriptyline which will also help with her insomnia. Had a long discussion about migraine medication, migraine pathophysiology. She also has a lot of neck pain, neck tightness, this can worsen her headaches are cause her headaches.  IHK:VQQVZ Stringeris a 34 y.o.femalehere as a referral from Dr. Orland Dec migraines. Past medical history of hypertension, high cholesterol, migraine, depression, anxiety, hypothyroidism, hyperlipidemia, nonalcoholic fatty liver, Turner's syndrome, asthma, rickets.Started at the age of 74. Mother is here and provides information as well. Mother has migraines. She has been to the emergency room multiple times. No inciting events, they just started at the age of 68, no head trauma. She has had a migraine last up to 2 weeks and went to the ED recently. Slowly worsening, progressive increase in frequency and severity. Only sleep and dark rooms help, recently naratriptan is helping her insurance will pay for it and she now has Imitrex. She has to wear dark glasses at the ofice. Recently she had left arm numbness in association with migraine, numbness, couldn't feel her arm and left facial numbness. She had facial droop with the headache (showed me a picture exhibiting left facial lower weakness). She has been to the ED and it has happened several times in the last several months. She often does not have an  aura but also has an aura, loses vision, sparkles, shimmering. Her right side has also been numb. Headaches are on the right side, pounding, throbbing, and behind the eye. She has 20 headache days out of the month and at least 1/2 are migrainous for the last 6 monthsand usually last all day if not longer. She has sensitivity to smells, nausea and vomiting. She also experiencedconfusion and memory changes during these episodes. No other focal neurologic deficits or complaints. Mother has migraines.  Reviewed notes, labs and imaging from outside physicians, which showed:   Patient has a history of migraine. Medications tried include: Reglan, Benadryl, Toradol.  BUN 11, creatinine 0.75 these labs drawn August 2017.   Personally reviewed MRI/MRAof the brain and agree with the following: IMPRESSION: 1. Normal MRI of the brain. 2. Normal variant MRA circle of Willis without significant proximal stenosis, aneurysm, or branch vessel occlusion.  CBC normal, CMP with elevated AST and ALT otherwise normal both drawn 11/28/2015.  Review of Systems: Patient complains of symptoms per HPI as well as the following symptoms: headache, dizziness, anxiety. Pertinent negatives per HPI. All others negative.   Social History   Socioeconomic History  . Marital status: Single    Spouse name: Not on file  . Number of children: Not on file  . Years of education: Not on file  . Highest education level: Some college, no degree  Occupational History  .  Not on file  Tobacco Use  . Smoking status: Never Smoker  . Smokeless tobacco: Never Used  Vaping Use  . Vaping Use: Never used  Substance and Sexual Activity  . Alcohol use: Not Currently    Comment: socially  . Drug use: No  . Sexual activity: Not on file  Other Topics Concern  . Not on file  Social History Narrative   Lives alone    Caffeine use: 2 cups daily   Social Determinants of Health   Financial Resource Strain: Not on file   Food Insecurity: Not on file  Transportation Needs: Not on file  Physical Activity: Not on file  Stress: Not on file  Social Connections: Not on file  Intimate Partner Violence: Not on file    Family History  Problem Relation Age of Onset  . Hypertension Mother   . Arthritis Mother   . Fibromyalgia Mother   . Hyperlipidemia Mother   . Diabetes Father   . Diabetes Paternal Grandmother   . Stroke Paternal Grandmother   . Kidney disease Paternal Grandmother   . Heart attack Paternal Grandmother   . Hypertension Paternal Grandmother   . Hyperlipidemia Paternal Grandmother   . Diabetes Paternal Grandfather   . Alcohol abuse Maternal Grandfather   . Cancer Maternal Grandfather        BRAIN    Past Medical History:  Diagnosis Date  . Allergy   . Anxiety   . Hematuria   . Hip dysplasia   . Hyperlipidemia   . Hypertension   . Migraine   . NASH (nonalcoholic steatohepatitis)   . Osteoporosis   . Scoliosis    minimal  . Thyroid disease   . Turner syndrome   . Vitamin D deficiency     Past Surgical History:  Procedure Laterality Date  . BILATERAL OOPHORECTOMY    . HERNIA REPAIR     x2  . HIP SURGERY    . KYPHOPLASTY  08/2016   L2  . TONSILLECTOMY    . TYMPANOSTOMY TUBE PLACEMENT      Current Outpatient Medications  Medication Sig Dispense Refill  . albuterol (PROVENTIL HFA;VENTOLIN HFA) 108 (90 Base) MCG/ACT inhaler Inhale 2 puffs into the lungs every 6 (six) hours as needed for wheezing. 1 Inhaler 12  . ALPRAZolam (XANAX) 0.5 MG tablet Take 0.5-1 tablets (0.25-0.5 mg total) by mouth 2 (two) times daily as needed for anxiety. Please print on bottle: "30 tablets must last 90 days." 30 tablet 0  . amLODipine (NORVASC) 5 MG tablet Take 1 tablet (5 mg total) by mouth daily. 90 tablet 6  . atorvastatin (LIPITOR) 80 MG tablet Take 1 tablet (80 mg total) by mouth daily. 90 tablet 3  . estradiol (CLIMARA - DOSED IN MG/24 HR) 0.1 mg/24hr patch Place 1 patch (0.1 mg total)  onto the skin once a week. 12 patch 3  . fexofenadine (ALLEGRA) 60 MG tablet Take 60 mg by mouth daily.      . Fluticasone-Salmeterol (ADVAIR DISKUS) 100-50 MCG/DOSE AEPB Inhale 1 puff into the lungs 2 (two) times daily. Inhale 1 puff into lungs bid 60 each 3  . levothyroxine (SYNTHROID) 112 MCG tablet TAKE 1 TABLET EVERY DAY ON EMPTY STOMACH 90 tablet 0  . loperamide (IMODIUM A-D) 2 MG tablet Take 1-2 tablets (2-4 mg total) by mouth 3 (three) times daily as needed (prior to meals to rpevent diarrhea). 90 tablet 1  . medroxyPROGESTERone (PROVERA) 10 MG tablet Take 1 tablet (10 mg total)  by mouth daily. 10 days per month 30 tablet 3  . Multiple Vitamin (MULTIVITAMIN) tablet Take 1 tablet by mouth daily.      . naratriptan (AMERGE) 2.5 MG tablet TAKE 1 TABLET AS NEEDED MIGRAINE MAY REPEAT AFTER 2 HOURS MAX 2 PER 24 HOURS 12 tablet 11  . nortriptyline (PAMELOR) 50 MG capsule TAKE 1 CAPSULE BY MOUTH EVERYDAY AT BEDTIME 90 capsule 3  . omeprazole (PRILOSEC) 40 MG capsule TAKE 1 CAPSULE BY MOUTH EVERY DAY 30 capsule 0  . ondansetron (ZOFRAN-ODT) 8 MG disintegrating tablet Take 1 tablet (8 mg total) by mouth every 8 (eight) hours as needed (prior to meals to rpevent diarrhea). 90 tablet 1  . promethazine (PHENERGAN) 12.5 MG tablet Take 1 tablet (12.5 mg total) by mouth every 6 (six) hours as needed for nausea or vomiting. 30 tablet 11  . propranolol (INDERAL) 10 MG tablet Take 1 tablet (10 mg total) by mouth 3 (three) times daily. 90 tablet 6  . Rimegepant Sulfate (NURTEC) 75 MG TBDP Take 75 mg by mouth daily as needed. For migraines. Take as close to onset of migraine as possible. One daily maximum. 8 tablet 0  . tizanidine (ZANAFLEX) 2 MG capsule Take 1 capsule (2 mg total) by mouth 3 (three) times daily as needed for muscle spasms. 60 capsule 11   No current facility-administered medications for this visit.    Allergies as of 04/04/2020 - Review Complete 12/14/2019  Allergen Reaction Noted  . Tape   06/14/2018  . Prednisone  07/22/2012    Vitals: There were no vitals taken for this visit. Last Weight:  Wt Readings from Last 1 Encounters:  12/14/19 171 lb (77.6 kg)   Last Height:   Ht Readings from Last 1 Encounters:  12/14/19 5' 2"  (1.575 m)   Physical exam: Exam: Gen: NAD, conversant, well nourised, obese, well groomed                     CV: RRR, no MRG. No Carotid Bruits. No peripheral edema, warm, nontender Eyes: Conjunctivae clear without exudates or hemorrhage  Neuro: Detailed Neurologic Exam  Speech:    Speech is normal; fluent and spontaneous with normal comprehension.  Cognition:    The patient is oriented to person, place, and time;     recent and remote memory intact;     language fluent;     normal attention, concentration,     fund of knowledge Cranial Nerves:    The pupils are equal, round, and reactive to light. The fundi are normal and spontaneous venous pulsations are present. Visual fields are full to finger confrontation. Extraocular movements are intact. Trigeminal sensation is intact and the muscles of mastication are normal. The face is symmetric. The palate elevates in the midline. Hearing intact. Voice is normal. Shoulder shrug is normal. The tongue has normal motion without fasciculations.   Motor Observation:    No asymmetry, no atrophy, and no involuntary movements noted. Tone:    Normal muscle tone.    Posture:    Posture is normal. normal erect    Strength:    Strength is V/V in the upper and lower limbs.      Sensation: intact to LT     Reflex Exam:   Assessment/Plan:34 year old female with chronic migraines without aura with status migrainosus not intractable. We have discussed other medications in the past, botox.  We started CGRP and she continues on nortriptyline and he headache have improved by 50% to  only half the month (15 days migraine free now). She feels though it is getting worse however with blurry vision and headaches  2-3x a week and blurry vision every other day, also for 10 days out of the month on progesterone gets headaches.  - Change Ajovy to emgality - she prefers to stay on Ajovy for now - Continue Amerge as needed for migraines - During the 10 days of headaches with progesterone take Nurtec - Blurry vision doesn't improve MRI brain (last one was in 2017 and normal) - Continue follow up with Ophthalmology - Can try propranolol next as preventative may help with her anxiety as well (also the glaucoma drops help and those are often beta blockers). Watch for hypertension.She says Xanax helps her migraines so a lot of this may be anxiety.  - Anxiety recommend follow up -Migraines: continue Ajovy and Nortriptyline. Also amlodipine. For acute management try to take triptan medications as early as possible.  - Timolol acutely for bad migraine attacks with eye pressure -Stopped lexapro, if needed can try effexor or cymbalta as these help with mood and migraine. - In the past ordered DRY NEEDLING for cervical myofascial pain, she never went  - stress and depression: Needs therapy and medical management, recommend therapist who can write prescriptions   - Migraine and musculoskeletal neck pain with cervicalgia: referred to integrative therapies in the past. May consider dry needling, PT, massage, exercise, stress management. Discussed again. She can go to Integrative therapies or [facility] whichever she prefers.Will switch flexeril to tizanidine. at bedtime.  - Discussed side effect of medications as per patient instructions. Discussed teratogenicity of medications.  No orders of the defined types were placed in this encounter.   I spent more than 40 minutes of face-to-face and non-face-to-face time with patient on the  No diagnosis found. diagnosis.  This included previsit chart review, lab review, study review, order entry, electronic health record documentation, patient education on the different  diagnostic and therapeutic options, counseling and coordination of care, risks and benefits of management, compliance, or risk factor reduction     Sarina Ill, MD  [facility] [address] [place], [place] [address]  Phone [phone] Fax [phone]  A total of 25 minutes was spent face-to-face with this patient. Over half this time was spent on counseling patient on the  No diagnosis found. and different diagnostic and therapeutic options available.

## 2020-04-04 ENCOUNTER — Telehealth: Payer: No Typology Code available for payment source | Admitting: Neurology

## 2020-04-04 ENCOUNTER — Other Ambulatory Visit: Payer: Self-pay | Admitting: Osteopathic Medicine

## 2020-04-04 DIAGNOSIS — Q969 Turner's syndrome, unspecified: Secondary | ICD-10-CM

## 2020-04-18 ENCOUNTER — Telehealth: Payer: No Typology Code available for payment source | Admitting: Neurology

## 2020-10-17 ENCOUNTER — Other Ambulatory Visit: Payer: Self-pay

## 2020-10-17 MED ORDER — ESTRADIOL 0.1 MG/24HR TD PTWK: 0.1000 mg | MEDICATED_PATCH | TRANSDERMAL | 3 refills | Status: DC

## 2020-12-16 ENCOUNTER — Other Ambulatory Visit: Payer: Self-pay | Admitting: Neurology

## 2020-12-24 ENCOUNTER — Other Ambulatory Visit: Payer: Self-pay | Admitting: Neurology

## 2021-01-11 ENCOUNTER — Other Ambulatory Visit: Payer: Self-pay | Admitting: Neurology

## 2021-01-27 ENCOUNTER — Other Ambulatory Visit: Payer: Self-pay | Admitting: Neurology

## 2021-02-12 ENCOUNTER — Other Ambulatory Visit: Payer: Self-pay | Admitting: Neurology

## 2021-02-25 ENCOUNTER — Other Ambulatory Visit: Payer: Self-pay | Admitting: Neurology

## 2021-02-25 DIAGNOSIS — I10 Essential (primary) hypertension: Secondary | ICD-10-CM

## 2021-03-03 ENCOUNTER — Other Ambulatory Visit: Payer: Self-pay | Admitting: Neurology

## 2021-03-25 ENCOUNTER — Other Ambulatory Visit: Payer: Self-pay | Admitting: Neurology

## 2021-03-25 DIAGNOSIS — I10 Essential (primary) hypertension: Secondary | ICD-10-CM

## 2021-04-25 ENCOUNTER — Telehealth: Payer: Self-pay | Admitting: Neurology

## 2021-04-25 ENCOUNTER — Telehealth (INDEPENDENT_AMBULATORY_CARE_PROVIDER_SITE_OTHER): Payer: No Typology Code available for payment source | Admitting: Neurology

## 2021-04-25 DIAGNOSIS — I10 Essential (primary) hypertension: Secondary | ICD-10-CM | POA: Diagnosis not present

## 2021-04-25 DIAGNOSIS — G43701 Chronic migraine without aura, not intractable, with status migrainosus: Secondary | ICD-10-CM

## 2021-04-25 MED ORDER — NARATRIPTAN HCL 2.5 MG PO TABS: ORAL_TABLET | ORAL | 6 refills | Status: DC

## 2021-04-25 MED ORDER — NORTRIPTYLINE HCL 50 MG PO CAPS: 50.0000 mg | ORAL_CAPSULE | Freq: Every day | ORAL | 3 refills | Status: DC

## 2021-04-25 MED ORDER — AMLODIPINE BESYLATE 5 MG PO TABS: 5.0000 mg | ORAL_TABLET | Freq: Every day | ORAL | 3 refills | Status: DC

## 2021-04-25 MED ORDER — PROPRANOLOL HCL ER 60 MG PO CP24: 60.0000 mg | ORAL_CAPSULE | Freq: Every day | ORAL | 6 refills | Status: DC

## 2021-04-25 MED ORDER — TIZANIDINE HCL 2 MG PO CAPS: 2.0000 mg | ORAL_CAPSULE | Freq: Three times a day (TID) | ORAL | 11 refills | Status: DC | PRN

## 2021-04-25 MED ORDER — PROMETHAZINE HCL 12.5 MG PO TABS: 12.5000 mg | ORAL_TABLET | Freq: Four times a day (QID) | ORAL | 11 refills | Status: DC | PRN

## 2021-04-25 NOTE — Telephone Encounter (Signed)
Received charge sheet for 200 units of Botox (M5784) for G43.701 (CPT [phone]). Patient will need to sign consent at first injection. Patient currently has [employer]. I will start PA & call patient to schedule upon approval.

## 2021-04-25 NOTE — Telephone Encounter (Signed)
Cristina Fret, MD  [name] Calls Start Botox approval please. She can be with me the first 1-2 injections then transition to Np (discussed she is fine with that). Please discuss and give information on the copay program(I saw her by video and did not give her any info) and schedule botox appointment.

## 2021-04-25 NOTE — Progress Notes (Signed)
[facility]    Provider:  Dr Jaynee Eagles Referring Provider: Emeterio Reeve, DO Primary Care Physician:  Emeterio Reeve, DO  CC:  Migraines  Virtual Visit via Video Note  I connected with Cristina Zimmerman on 04/25/21 at  1:00 PM EST by a video enabled telemedicine application and verified that I am speaking with the correct person using two identifiers.  Location: Patient: home Provider: office   I discussed the limitations of evaluation and management by telemedicine and the availability of in person appointments. The patient expressed understanding and agreed to proceed.   Follow Up Instructions:    I discussed the assessment and treatment plan with the patient. The patient was provided an opportunity to ask questions and all were answered. The patient agreed with the plan and demonstrated an understanding of the instructions.   The patient was advised to call back or seek an in-person evaluation if the symptoms worsen or if the condition fails to improve as anticipated.  I provided 30 minutes of non-face-to-face time during this encounter.   Melvenia Beam, MD   04/25/2021: She does not feel Ajovy is helping. She tried it and had GI side effects, her stomach didn't like it. She stopped the Ajovy a year ago and did not tell us(side effects), she says she has been under stress and very busy. On average she has been having at least 8 migraine days a month that are severe or moderate the last 6 months and recently even worsening to 15 migraine days a month the last 3 months, pulsating/pounding/throbbing, nausea, photophobia/phonophobia, nausea, movement makes it worse, can be unilateral, propranolol helped a little bit but still with significant migraines, tizanidine helped, no medication overuse, no aura, discussed botox. Migraines can last 24-72 hours or longer. Failed multiple medications.   Medications tried for migraine include: Ajovy(side effects),  topamax(depression), nortriptyline, amlodipine, propranolol, naratriptan, phenergan, tizanidine, nurtec, flexeril, lexapro, toradol, depo-medrol, reglan inj, amerge, zofran, prednisone, imitrex,   12/14/2019: On Ajovy 12/14/2019: 36 year old here for follow up of migraines.  She feels like now it is wearing off. She is still improved but having more instances of migraines. She was doing very well, she is on progesterone and every time she takes it she goes into a full-blown attack. The other days she is fine, she takes the progesterone 10x a month and otherwise she can go weeks without a headache. She is still having aura, her eyes are blurry, she got a new prescription. The blurriness is with headache and without headache, both eyes, more pain on the right side. Glaucoma drops helps. She has a blur screen filter. She is having a lot of anxiety as well.  Not using all amerge in a month but using it more headache 2-3x a week. Vision every day or every other day.  Interval history 12/13/2018: Here for follow up of migraines. She has been taking Ajovy for 3-4 months, she missed it last month by mistake and headaches worsened. Migraines have been better on the Ajovy. Also she noticed worsening of headaches when she was not on the Nortriptyline so she continues to take it. She changed her hormone patch and she feels better on the patch she is on now. Now half the month headache free. Stay on nortriptyline, she did see a difference when she was not taking it. Migraines has changed a little to the back of her head.  Feels better off lexapro as far as mood and migraines. If needed discussed we can  try cymbalta or effexor as these are good for mood and better for migraines.   Interval history 06/14/2018:  She has been worsening with migraine frequency. She is having daily headaches. At least 20 are migrainous a month. Discussed trying the new medications CGRP. She does not have ovaries. She has IBS so will not give  Aimovig, can try Ajovy or emgality. Also discussed her depression and anxiety which is worsening, recommend therapy and seeing someone who can help with both therapy and medication management.  0 Interval history 12/30/2016: Patient is here for follow-up of chronic migraines. Topiramate helped worsen depression. Other medications that would not impact her depression would be Depakote, SNRIs like Cymbalta and Effexor, TCAs or gabapentin. We decided to try nortriptyline. She reported depression and hair loss with the Topamax. Propranolol can cause worsening depression. She is already on Lexapro so hesitate to try Cymbalta or Effexor. Referred her to physical therapy as well as last appointment. Migraines are improved. She takes 9 naratriptan in a month. She has 16 headache days a month and 12 are migrainous. Ongoing for at least a year. They can last up to 2 weeks.  Headaches are on the right side, pounding, throbbing, and behind the eye with nausea, light and sound sensitivity an smell triggers.No medication overuse. Migraines without aura. She is sleeping well. She is managing stress.   Reglan, Benadryl, Toradol., Topiramate, nortriptyline, norvasc, Zanaflex, phenergan   Interval history: Topiramate helped but has worsened depression. Other medications that can be used that will not impact her depression would be depakote, SNRIs (cymbalta, effexor), TCAs, Gabapentin. She states considerable improvement on the topamax. This is why she hasn't wanted to start it. Patient's mother is here provides much information as is the patient has been extremely depressed since starting Topamax. Patient also reports hair loss. Denies any thoughts of hurting herself. Topamax can cause worsening depression. Today we discussed migraine management including all the different classes of medications that we can use. Epilepsy medications, Depakote is a medication that could be used in this particular case, patient has Turner syndrome  and infertility still discussed teratogenicity Depakote. Anti-epilepsy medications may cause bone loss and patient has osteoporosis and she worries about this. I blood pressure medications such as propranolol can cause worsening depression. Could try Cymbalta and Effexor but she is already on Lexapro which I don't want to change and putting her on both risks serotonin syndrome. Tricyclic antidepressant is something that we discussed today, it also increases serotonin but less likely to cause serotonin syndrome. EKG performed in the office showed QTC of 409 which is normal. Discussed serotonin syndrome. Will add nortriptyline which will also help with her insomnia. Had a long discussion about migraine medication, migraine pathophysiology. She also has a lot of neck pain, neck tightness, this can worsen her headaches are cause her headaches.   HPI:  Cristina Zimmerman is a 36 y.o. female here as a referral from Dr. Sheppard Coil for migraines. Past medical history of hypertension, high cholesterol, migraine, depression, anxiety, hypothyroidism, hyperlipidemia, nonalcoholic fatty liver, Turner's syndrome, asthma, rickets.Started at the age of 9. Mother is here and provides information as well. Mother has migraines. She has been to the emergency room multiple times. No inciting events, they just started at the age of 81, no head trauma. She has had a migraine last up to 2 weeks and went to the ED recently. Slowly worsening, progressive increase in frequency and severity. Only sleep and dark rooms help, recently naratriptan is helping her  insurance will pay for it and she now has Imitrex. She has to wear dark glasses at the ofice. Recently she had left arm numbness in association with migraine, numbness, couldn't feel her arm and left facial numbness. She had facial droop with the headache (showed me a picture exhibiting left facial lower weakness). She has been to the ED and it has happened several times in the last several  months. She often does not have an aura but also has an aura, loses vision, sparkles, shimmering.  Her right side has also been numb. Headaches are on the right side, pounding, throbbing, and behind the eye. She has 20 headache days out of the month and at least 1/2 are migrainous for the last 6 months and usually last all day if not longer. She has sensitivity to smells, nausea and vomiting. She also experienced confusion and memory changes during these episodes. No other focal neurologic deficits or complaints. Mother has migraines.   Reviewed notes, labs and imaging from outside physicians, which showed:    Patient has a history of migraine. Medications tried include: Reglan, Benadryl, Toradol.   BUN 11, creatinine 0.75 these labs drawn August 2017.    Personally reviewed MRI/MRA of the brain and agree with the following: IMPRESSION: 1. Normal MRI of the brain. 2. Normal variant MRA circle of Willis without significant proximal stenosis, aneurysm, or branch vessel occlusion.   CBC normal, CMP with elevated AST and ALT otherwise normal both drawn 11/28/2015.   Review of Systems: Patient complains of symptoms per HPI as well as the following symptoms: headaches . Pertinent negatives and positives per HPI. All others negative     Social History   Socioeconomic History   Marital status: Single    Spouse name: Not on file   Number of children: Not on file   Years of education: Not on file   Highest education level: Some college, no degree  Occupational History   Not on file  Tobacco Use   Smoking status: Never   Smokeless tobacco: Never  Vaping Use   Vaping Use: Never used  Substance and Sexual Activity   Alcohol use: Not Currently    Comment: socially   Drug use: No   Sexual activity: Not on file  Other Topics Concern   Not on file  Social History Narrative   Lives alone    Caffeine use: 2 cups daily   Social Determinants of Health   Financial Resource Strain: Not on  file  Food Insecurity: Not on file  Transportation Needs: Not on file  Physical Activity: Not on file  Stress: Not on file  Social Connections: Not on file  Intimate Partner Violence: Not on file    Family History  Problem Relation Age of Onset   Hypertension Mother    Arthritis Mother    Fibromyalgia Mother    Hyperlipidemia Mother    Diabetes Father    Diabetes Paternal Grandmother    Stroke Paternal Grandmother    Kidney disease Paternal Grandmother    Heart attack Paternal Grandmother    Hypertension Paternal Grandmother    Hyperlipidemia Paternal Grandmother    Diabetes Paternal Grandfather    Alcohol abuse Maternal Grandfather    Cancer Maternal Grandfather        BRAIN    Past Medical History:  Diagnosis Date   Allergy    Anxiety    Hematuria    Hip dysplasia    Hyperlipidemia    Hypertension  Migraine    NASH (nonalcoholic steatohepatitis)    Osteoporosis    Scoliosis    minimal   Thyroid disease    Turner syndrome    Vitamin D deficiency     Past Surgical History:  Procedure Laterality Date   BILATERAL OOPHORECTOMY     HERNIA REPAIR     x2   HIP SURGERY     KYPHOPLASTY  08/2016   L2   TONSILLECTOMY     TYMPANOSTOMY TUBE PLACEMENT      Current Outpatient Medications  Medication Sig Dispense Refill   propranolol ER (INDERAL LA) 60 MG 24 hr capsule Take 1 capsule (60 mg total) by mouth daily. 90 capsule 6   albuterol (PROVENTIL HFA;VENTOLIN HFA) 108 (90 Base) MCG/ACT inhaler Inhale 2 puffs into the lungs every 6 (six) hours as needed for wheezing. 1 Inhaler 12   ALPRAZolam (XANAX) 0.5 MG tablet Take 0.5-1 tablets (0.25-0.5 mg total) by mouth 2 (two) times daily as needed for anxiety. Please print on bottle: "30 tablets must last 90 days." 30 tablet 0   amLODipine (NORVASC) 5 MG tablet Take 1 tablet (5 mg total) by mouth daily. 180 tablet 3   atorvastatin (LIPITOR) 80 MG tablet Take 1 tablet (80 mg total) by mouth daily. 90 tablet 3   estradiol  (CLIMARA - DOSED IN MG/24 HR) 0.1 mg/24hr patch Place 1 patch (0.1 mg total) onto the skin once a week. 12 patch 3   fexofenadine (ALLEGRA) 60 MG tablet Take 60 mg by mouth daily.       Fluticasone-Salmeterol (ADVAIR DISKUS) 100-50 MCG/DOSE AEPB Inhale 1 puff into the lungs 2 (two) times daily. Inhale 1 puff into lungs bid 60 each 3   levothyroxine (SYNTHROID) 112 MCG tablet TAKE 1 TABLET EVERY DAY ON EMPTY STOMACH 90 tablet 0   loperamide (IMODIUM A-D) 2 MG tablet Take 1-2 tablets (2-4 mg total) by mouth 3 (three) times daily as needed (prior to meals to rpevent diarrhea). 90 tablet 1   medroxyPROGESTERone (PROVERA) 10 MG tablet TAKE 1 TABLET BY MOUTH EVERY DAY FOR 10 DAYS PER MONTH 30 tablet 3   Multiple Vitamin (MULTIVITAMIN) tablet Take 1 tablet by mouth daily.       naratriptan (AMERGE) 2.5 MG tablet TAKE 1 TABLET AS NEEDED MIGRAINE MAY REPEAT AFTER 2 HOURS MAX 2 PER 24 HOURS.  will need follow up before any more refills approved call [phone]. 12 tablet 6   nortriptyline (PAMELOR) 50 MG capsule Take 1 capsule (50 mg total) by mouth at bedtime. 90 capsule 3   omeprazole (PRILOSEC) 40 MG capsule TAKE 1 CAPSULE BY MOUTH EVERY DAY 30 capsule 0   ondansetron (ZOFRAN-ODT) 8 MG disintegrating tablet Take 1 tablet (8 mg total) by mouth every 8 (eight) hours as needed (prior to meals to rpevent diarrhea). 90 tablet 1   promethazine (PHENERGAN) 12.5 MG tablet Take 1 tablet (12.5 mg total) by mouth every 6 (six) hours as needed for nausea or vomiting. 30 tablet 11   tizanidine (ZANAFLEX) 2 MG capsule Take 1 capsule (2 mg total) by mouth 3 (three) times daily as needed for muscle spasms. 60 capsule 11   No current facility-administered medications for this visit.    Allergies as of 04/25/2021 - Review Complete 12/14/2019  Allergen Reaction Noted   Tape  06/14/2018   Prednisone  07/22/2012    Vitals: There were no vitals taken for this visit. Last Weight:  Wt Readings from Last 1 Encounters:   12/14/19  171 lb (77.6 kg)   Last Height:   Ht Readings from Last 1 Encounters:  12/14/19 5\' 2"  (1.575 m)    Physical exam: Exam: Gen: NAD, conversant      CV: Denies palpitations or chest pain or SOB. VS: Breathing at a normal rate. Weight appears within normal limits. Not febrile. Eyes: Conjunctivae clear without exudates or hemorrhage  Neuro: Detailed Neurologic Exam  Speech:    Speech is normal; fluent and spontaneous with normal comprehension.  Cognition:    The patient is oriented to person, place, and time;     recent and remote memory intact;     language fluent;     normal attention, concentration,     fund of knowledge Cranial Nerves:    The pupils are equal, round, and reactive to light. Cannot perform fundoscopic exam. Visual fields are full to finger confrontation. Extraocular movements are intact.  The face is symmetric with normal sensation. The palate elevates in the midline. Hearing intact. Voice is normal. Shoulder shrug is normal. The tongue has normal motion without fasciculations.   Motor Observation:   no involuntary movements noted. Tone:    Appears normal  Posture:    Posture is normal. normal erect    Strength:    Strength is anti-gravity and symmetric in the upper and lower limbs.      Sensation: intact to LT      Assessment/Plan:  36 year old female with chronic migraines without aura with status migrainosus not intractable. Start Botox procedure. Refill meds. Increase propranolol.   Medications tried for migraine include: Ajovy(side effects), topamax(depression), nortriptyline, amlodipine, propranolol, naratriptan, phenergan, tizanidine, nurtec, flexeril, lexapro, toradol, depo-medrol, reglan inj, amerge, zofran, prednisone, imitrex,   Start Botox. She can be with me the first 1-2 injections then transition to Np (discussed she is fine with that). Please discuss and give information on the copay program(I saw her by video) and schedule botox  appointment.  - In the past ordered DRY NEEDLING for cervical myofascial pain, she never went, we discussed again. Refill tizanidine, propranolol   - stress and depression: Needs therapy and medical management, recommend therapist who can write prescriptions   - Migraine and musculoskeletal neck pain with cervicalgia: referred to integrative therapies in the past. May consider dry needling, PT, massage, exercise, stress management. Discussed again. She can go to Integrative therapies or [facility] whichever she prefers.Will switch flexeril to tizanidine. at bedtime.   - Discussed side effect of medications as per patient instructions. Discussed teratogenicity of medications.  Refill meds:   Meds ordered this encounter  Medications   tizanidine (ZANAFLEX) 2 MG capsule    Sig: Take 1 capsule (2 mg total) by mouth 3 (three) times daily as needed for muscle spasms.    Dispense:  60 capsule    Refill:  11   promethazine (PHENERGAN) 12.5 MG tablet    Sig: Take 1 tablet (12.5 mg total) by mouth every 6 (six) hours as needed for nausea or vomiting.    Dispense:  30 tablet    Refill:  11   nortriptyline (PAMELOR) 50 MG capsule    Sig: Take 1 capsule (50 mg total) by mouth at bedtime.    Dispense:  90 capsule    Refill:  3    REFILL RENEWAL   naratriptan (AMERGE) 2.5 MG tablet    Sig: TAKE 1 TABLET AS NEEDED MIGRAINE MAY REPEAT AFTER 2 HOURS MAX 2 PER 24 HOURS.  will need follow up before any more  refills approved call [phone].    Dispense:  12 tablet    Refill:  6    Let patient know she will need follow up before any more refills approved call [phone]   amLODipine (NORVASC) 5 MG tablet    Sig: Take 1 tablet (5 mg total) by mouth daily.    Dispense:  180 tablet    Refill:  3    Pt has pending appt 04/25/2021. Sending 60 day supply in to last until seen.   propranolol ER (INDERAL LA) 60 MG 24 hr capsule    Sig: Take 1 capsule (60 mg total) by mouth daily.    Dispense:  90 capsule     Refill:  Boyds, MD  [facility] [address] [place], [place] [address]  Phone [phone] Fax [phone]

## 2021-05-01 NOTE — Telephone Encounter (Signed)
[employer] PA form faxed to plan with OV notes. Pending. [employer] PA line: [phone].

## 2021-05-06 NOTE — Telephone Encounter (Signed)
Received approval from [facility] for Botox. Case #[id number] (05/01/21- 10/28/21).

## 2021-05-14 ENCOUNTER — Other Ambulatory Visit: Payer: Self-pay

## 2021-05-14 ENCOUNTER — Ambulatory Visit (INDEPENDENT_AMBULATORY_CARE_PROVIDER_SITE_OTHER): Payer: No Typology Code available for payment source | Admitting: Neurology

## 2021-05-14 DIAGNOSIS — G43701 Chronic migraine without aura, not intractable, with status migrainosus: Secondary | ICD-10-CM

## 2021-05-14 NOTE — Progress Notes (Signed)
Botox consent signed  Botox- 200 units x 1 vial Lot: [id number] Expiration: 12/2023 NDC: 7616-0737-10  Bacteriostatic 0.9% Sodium Chloride- 21mL total Lot: [id number] Expiration: 11/20/2022 NDC: 5462-7035-00  Dx: X38.182 B/B

## 2021-05-14 NOTE — Progress Notes (Signed)
Consent Form Botulism Toxin Injection For Chronic Migraine  First botox. +a.   Reviewed orally with patient, additionally signature is on file:  Botulism toxin has been approved by the Federal drug administration for treatment of chronic migraine. Botulism toxin does not cure chronic migraine and it may not be effective in some patients.  The administration of botulism toxin is accomplished by injecting a small amount of toxin into the muscles of the neck and head. Dosage must be titrated for each individual. Any benefits resulting from botulism toxin tend to wear off after 3 months with a repeat injection required if benefit is to be maintained. Injections are usually done every 3-4 months with maximum effect peak achieved by about 2 or 3 weeks. Botulism toxin is expensive and you should be sure of what costs you will incur resulting from the injection.  The side effects of botulism toxin use for chronic migraine may include:   -Transient, and usually mild, facial weakness with facial injections  -Transient, and usually mild, head or neck weakness with head/neck injections  -Reduction or loss of forehead facial animation due to forehead muscle weakness  -Eyelid drooping  -Dry eye  -Pain at the site of injection or bruising at the site of injection  -Double vision  -Potential unknown long term risks  Contraindications: You should not have Botox if you are pregnant, nursing, allergic to albumin, have an infection, skin condition, or muscle weakness at the site of the injection, or have myasthenia gravis, Lambert-Eaton syndrome, or ALS.  It is also possible that as with any injection, there may be an allergic reaction or no effect from the medication. Reduced effectiveness after repeated injections is sometimes seen and rarely infection at the injection site may occur. All care will be taken to prevent these side effects. If therapy is given over a long time, atrophy and wasting in the muscle  injected may occur. Occasionally the patient's become refractory to treatment because they develop antibodies to the toxin. In this event, therapy needs to be modified.  I have read the above information and consent to the administration of botulism toxin.    BOTOX PROCEDURE NOTE FOR MIGRAINE HEADACHE    Contraindications and precautions discussed with patient(above). Aseptic procedure was observed and patient tolerated procedure. Procedure performed by Dr. Artemio Aly  The condition has existed for more than 6 months, and pt does not have a diagnosis of ALS, Myasthenia Gravis or Lambert-Eaton Syndrome.  Risks and benefits of injections discussed and pt agrees to proceed with the procedure.  Written consent obtained  These injections are medically necessary. Pt  receives good benefits from these injections. These injections do not cause sedations or hallucinations which the oral therapies may cause.  Description of procedure:  The patient was placed in a sitting position. The standard protocol was used for Botox as follows, with 5 units of Botox injected at each site:   -Procerus muscle, midline injection  -Corrugator muscle, bilateral injection  -Frontalis muscle, bilateral injection, with 2 sites each side, medial injection was performed in the upper one third of the frontalis muscle, in the region vertical from the medial inferior edge of the superior orbital rim. The lateral injection was again in the upper one third of the forehead vertically above the lateral limbus of the cornea, 1.5 cm lateral to the medial injection site.  -Temporalis muscle injection, 4 sites, bilaterally. The first injection was 3 cm above the tragus of the ear, second injection site was 1.5  cm to 3 cm up from the first injection site in line with the tragus of the ear. The third injection site was 1.5-3 cm forward between the first 2 injection sites. The fourth injection site was 1.5 cm posterior to the second  injection site.   -Occipitalis muscle injection, 3 sites, bilaterally. The first injection was done one half way between the occipital protuberance and the tip of the mastoid process behind the ear. The second injection site was done lateral and superior to the first, 1 fingerbreadth from the first injection. The third injection site was 1 fingerbreadth superiorly and medially from the first injection site.  -Cervical paraspinal muscle injection, 2 sites, bilateral knee first injection site was 1 cm from the midline of the cervical spine, 3 cm inferior to the lower border of the occipital protuberance. The second injection site was 1.5 cm superiorly and laterally to the first injection site.  -Trapezius muscle injection was performed at 3 sites, bilaterally. The first injection site was in the upper trapezius muscle halfway between the inflection point of the neck, and the acromion. The second injection site was one half way between the acromion and the first injection site. The third injection was done between the first injection site and the inflection point of the neck.   Will return for repeat injection in 3 months.   155 units of Botox was used, 45 Botox not injected was wasted. The patient tolerated the procedure well, there were no complications of the above procedure.

## 2021-07-31 ENCOUNTER — Other Ambulatory Visit: Payer: Self-pay | Admitting: Osteopathic Medicine

## 2021-08-06 ENCOUNTER — Ambulatory Visit: Payer: No Typology Code available for payment source | Admitting: Neurology

## 2021-09-04 ENCOUNTER — Other Ambulatory Visit: Payer: Self-pay | Admitting: Physician Assistant

## 2021-10-18 NOTE — Telephone Encounter (Signed)
Completed [employer] Botox PA form, placed in Nurse Pod for MD signature.

## 2021-10-23 ENCOUNTER — Encounter: Payer: Self-pay | Admitting: Neurology

## 2021-10-23 NOTE — Telephone Encounter (Signed)
Faxed signed PA form with OV notes to [place] at [phone].

## 2021-10-28 NOTE — Telephone Encounter (Signed)
Completed the [facility]  10-23-2021 thru 04-25-2022 (6 months).  Needs signature.

## 2021-10-31 NOTE — Telephone Encounter (Signed)
Received approval from [place], [place] # [id number] (10/29/2021-10/29/2022.

## 2021-11-05 NOTE — Telephone Encounter (Signed)
Signature received for these forms. Provided to medical records for processing.

## 2022-01-27 ENCOUNTER — Ambulatory Visit (INDEPENDENT_AMBULATORY_CARE_PROVIDER_SITE_OTHER): Payer: No Typology Code available for payment source | Admitting: Family Medicine

## 2022-01-27 ENCOUNTER — Encounter: Payer: Self-pay | Admitting: Family Medicine

## 2022-01-27 VITALS — BP 103/69 | HR 88 | Ht 62.0 in | Wt 175.0 lb

## 2022-01-27 DIAGNOSIS — E039 Hypothyroidism, unspecified: Secondary | ICD-10-CM

## 2022-01-27 DIAGNOSIS — E7800 Pure hypercholesterolemia, unspecified: Secondary | ICD-10-CM

## 2022-01-27 DIAGNOSIS — Q969 Turner's syndrome, unspecified: Secondary | ICD-10-CM | POA: Diagnosis not present

## 2022-01-27 DIAGNOSIS — M8080XS Other osteoporosis with current pathological fracture, unspecified site, sequela: Secondary | ICD-10-CM | POA: Diagnosis not present

## 2022-01-27 DIAGNOSIS — R41840 Attention and concentration deficit: Secondary | ICD-10-CM

## 2022-01-27 DIAGNOSIS — Z23 Encounter for immunization: Secondary | ICD-10-CM | POA: Diagnosis not present

## 2022-01-27 MED ORDER — ESTRADIOL 0.1 MG/24HR TD PTWK: 0.1000 mg | MEDICATED_PATCH | TRANSDERMAL | 0 refills | Status: DC

## 2022-01-27 NOTE — Assessment & Plan Note (Signed)
-   have re-ordered a DEXA scan since pt says she has not been on medication in a while for osteoporosis and I would like to check bone density

## 2022-01-27 NOTE — Assessment & Plan Note (Signed)
-   have ordered TSH level. If off we can start where she left off on her levothyroxine 17mg and then follow up in 4-6 weeks for repeat blood work  - pt reports anxiety which could be a cause of TSH levels being off.

## 2022-01-27 NOTE — Assessment & Plan Note (Signed)
-   have ordered Vit D level since pt has pmh of vit d deficiency

## 2022-01-27 NOTE — Assessment & Plan Note (Signed)
-   pt has pmh of Turner Syndrome and has not had lab work done in a while and follow up on this.

## 2022-01-27 NOTE — Progress Notes (Signed)
Established patient visit   Patient: Cristina Zimmerman   DOB: 07/16/1985   36 y.o. 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 Subsequently, she has a pmh of hypothyroidism, osteoporosis, anxiety, HTN. She has been extremely busy with working and finishing school and has been lost to follow up with her providers. She is here today to re-establish care. She has been off her thyroid medication. She was previously on levothyroxine 129mg. She was on the estradiol patch 0.161mbut has been able to keep up with her estradiol patches.      Review of Systems  Constitutional:  Negative for activity change, fatigue and fever.  Respiratory:  Negative for cough and shortness of breath.   Cardiovascular:  Negative for chest pain.  Gastrointestinal:  Negative for abdominal pain.  Genitourinary:  Negative for difficulty urinating.       No outpatient medications have been marked as taking for the 01/27/22 encounter (Office Visit) with WaOwens LofflerDO.    OBJECTIVE    BP 103/69   Pulse 88   Ht 5' 2"  (1.575 m)   Wt 175 lb (79.4 kg)   SpO2 99%   BMI 32.01 kg/m   Physical Exam Vitals and nursing note reviewed.  Constitutional:      General: She is not in acute distress.    Appearance: Normal appearance.  HENT:     Head: Normocephalic and atraumatic.     Right Ear: External ear normal.     Left Ear: External ear normal.     Nose: Nose normal.  Eyes:     Conjunctiva/sclera: Conjunctivae normal.  Cardiovascular:     Rate and Rhythm: Normal rate and regular rhythm.  Pulmonary:     Effort: Pulmonary effort is normal.     Breath sounds: Normal breath sounds.  Neurological:     General: No focal deficit present.      Mental Status: She is alert and oriented to person, place, and time.  Psychiatric:        Mood and Affect: Mood normal.        Behavior: Behavior normal.        Thought Content: Thought content normal.        Judgment: Judgment normal.          ASSESSMENT & PLAN    Problem List Items Addressed This Visit       Endocrine   Hypothyroidism - Primary    - have ordered TSH level. If off we can start where she left off on her levothyroxine 11228mand then follow up in 4-6 weeks for repeat blood work  - pt reports anxiety which could be a cause of TSH levels being off.       Relevant Orders   TSH + free T4   Flu Vaccine QUAD 6+ mos PF IM (Fluarix Quad PF) (Completed)     Musculoskeletal and Integument   Osteoporosis    - have re-ordered a DEXA scan since pt says she has not been on medication in a while for osteoporosis and I would like to check bone density       Relevant Orders  Vitamin D (25 hydroxy)     Other   Turner syndrome    - pt has pmh of Turner Syndrome and has not had lab work done in a while and follow up on this.       Relevant Medications   estradiol (CLIMARA - DOSED IN MG/24 HR) 0.1 mg/24hr patch   Other Relevant Orders   DG Bone Density   CBC   COMPLETE METABOLIC PANEL WITH GFR   Other Visit Diagnoses     Hypercholesterolemia       Relevant Orders   Lipid panel   Attention and concentration deficit       Relevant Orders   Ambulatory referral to Psychology       Return in about 5 weeks (around 03/03/2022).  For thyroid check and to discuss anxiety and see if this is something we need to treat.      Meds ordered this encounter  Medications   estradiol (CLIMARA - DOSED IN MG/24 HR) 0.1 mg/24hr patch    Sig: Place 1 patch (0.1 mg total) onto the skin once a week.    Dispense:  12 patch    Refill:  0    Orders Placed This Encounter  Procedures   DG Bone Density    Standing Status:   Future    Standing Expiration Date:   01/28/2023     Order Specific Question:   Reason for Exam (SYMPTOM  OR DIAGNOSIS REQUIRED)    Answer:   osteoporosis    Order Specific Question:   Is the patient pregnant?    Answer:   No    Order Specific Question:   Preferred imaging location?    Answer:   [facility]   Flu Vaccine QUAD 6+ mos PF IM (Fluarix Quad PF)   TSH + free T4   Lipid panel   Vitamin D (25 hydroxy)   CBC   COMPLETE METABOLIC PANEL WITH GFR   Ambulatory referral to Psychology    Referral Priority:   Routine    Referral Type:   Psychiatric    Referral Reason:   Specialty Services Required    Referred to Provider:   Oren Binet, PhD    Requested Specialty:   Psychology    Number of Visits Requested:   [place], [place] [phone] (phone) [phone] (fax)  [place]

## 2022-01-28 ENCOUNTER — Other Ambulatory Visit: Payer: Self-pay | Admitting: Family Medicine

## 2022-01-28 DIAGNOSIS — E039 Hypothyroidism, unspecified: Secondary | ICD-10-CM

## 2022-01-28 DIAGNOSIS — E559 Vitamin D deficiency, unspecified: Secondary | ICD-10-CM

## 2022-01-28 MED ORDER — CHOLECALCIFEROL 20 MCG (800 UNIT) PO TABS: 1.0000 | ORAL_TABLET | Freq: Every day | ORAL | 0 refills | Status: DC

## 2022-01-28 MED ORDER — LEVOTHYROXINE SODIUM 112 MCG PO TABS: 112.0000 ug | ORAL_TABLET | Freq: Every day | ORAL | 3 refills | Status: DC

## 2022-01-28 NOTE — Progress Notes (Signed)
Called and spoke to patient about blood work. Recommended low cholesterol foods and exercise to get her cholesterol down and HDL increased. Have restarted her synthroid at 145mg. She is vit d deficient and I have sent supplement to pharmacy. Pt verbalized understanding. Hgb at 9 and I have ordered iron panel.

## 2022-01-29 ENCOUNTER — Encounter: Payer: Self-pay | Admitting: Family Medicine

## 2022-01-29 ENCOUNTER — Other Ambulatory Visit: Payer: Self-pay | Admitting: Family Medicine

## 2022-01-29 DIAGNOSIS — D509 Iron deficiency anemia, unspecified: Secondary | ICD-10-CM

## 2022-02-05 ENCOUNTER — Ambulatory Visit (INDEPENDENT_AMBULATORY_CARE_PROVIDER_SITE_OTHER): Payer: No Typology Code available for payment source

## 2022-02-05 DIAGNOSIS — Q969 Turner's syndrome, unspecified: Secondary | ICD-10-CM

## 2022-02-10 ENCOUNTER — Encounter: Payer: Self-pay | Admitting: Family Medicine

## 2022-02-11 ENCOUNTER — Inpatient Hospital Stay (HOSPITAL_BASED_OUTPATIENT_CLINIC_OR_DEPARTMENT_OTHER): Payer: No Typology Code available for payment source | Admitting: Medical Oncology

## 2022-02-11 ENCOUNTER — Encounter: Payer: Self-pay | Admitting: Medical Oncology

## 2022-02-11 ENCOUNTER — Inpatient Hospital Stay: Payer: No Typology Code available for payment source | Attending: Hematology & Oncology

## 2022-02-11 VITALS — BP 111/79 | HR 82 | Temp 98.5°F | Resp 17 | Ht 62.5 in | Wt 174.1 lb

## 2022-02-11 DIAGNOSIS — K7581 Nonalcoholic steatohepatitis (NASH): Secondary | ICD-10-CM | POA: Insufficient documentation

## 2022-02-11 DIAGNOSIS — D509 Iron deficiency anemia, unspecified: Secondary | ICD-10-CM | POA: Diagnosis not present

## 2022-02-11 DIAGNOSIS — Z79899 Other long term (current) drug therapy: Secondary | ICD-10-CM | POA: Insufficient documentation

## 2022-02-11 DIAGNOSIS — Z808 Family history of malignant neoplasm of other organs or systems: Secondary | ICD-10-CM | POA: Diagnosis not present

## 2022-02-11 DIAGNOSIS — N920 Excessive and frequent menstruation with regular cycle: Secondary | ICD-10-CM | POA: Diagnosis not present

## 2022-02-11 LAB — CBC WITH DIFFERENTIAL/PLATELET
Abs Immature Granulocytes: 0.04 10*3/uL (ref 0.00–0.07)
Basophils Absolute: 0.1 10*3/uL (ref 0.0–0.1)
Basophils Relative: 1 %
Eosinophils Absolute: 0.2 10*3/uL (ref 0.0–0.5)
Eosinophils Relative: 3 %
HCT: 32.5 % — ABNORMAL LOW (ref 36.0–46.0)
Hemoglobin: 9.6 g/dL — ABNORMAL LOW (ref 12.0–15.0)
Immature Granulocytes: 1 %
Lymphocytes Relative: 42 %
Lymphs Abs: 2.6 10*3/uL (ref 0.7–4.0)
MCH: 20.3 pg — ABNORMAL LOW (ref 26.0–34.0)
MCHC: 29.5 g/dL — ABNORMAL LOW (ref 30.0–36.0)
MCV: 68.9 fL — ABNORMAL LOW (ref 80.0–100.0)
Monocytes Absolute: 0.5 10*3/uL (ref 0.1–1.0)
Monocytes Relative: 9 %
Neutro Abs: 2.7 10*3/uL (ref 1.7–7.7)
Neutrophils Relative %: 44 %
Platelets: 349 10*3/uL (ref 150–400)
RBC: 4.72 MIL/uL (ref 3.87–5.11)
RDW: 20 % — ABNORMAL HIGH (ref 11.5–15.5)
Smear Review: NORMAL
WBC: 6.1 10*3/uL (ref 4.0–10.5)
nRBC: 0 % (ref 0.0–0.2)

## 2022-02-11 MED ORDER — GENTLE IRON 28-60-0.008-0.4 MG PO CAPS: 1.0000 | ORAL_CAPSULE | Freq: Every morning | ORAL | 3 refills | Status: DC

## 2022-02-11 NOTE — Progress Notes (Signed)
[place] [phone]   [phone]  INITIAL CONSULT NOTE  Patient Care Team: Owens Loffler, DO as PCP - General (Family Medicine)  Hematological/Oncological History # None  CHIEF COMPLAINTS/PURPOSE OF CONSULTATION:  "Anemia"  HISTORY OF PRESENTING ILLNESS:  Cristina Zimmerman 36 y.o. female with medical history significant for Iron deficiency anemia, NASH.   She reports that 2 weeks ago she found out that she was anemic by her PCP. This is new to her. She is on estrogen patches for her Turner's Syndrome. Menstrual cycles are very heavy- worsening since on her estrogen patch. Typically lasting about 2 weeks in length. She uses pads and tampons. She uses super tampons needing to change it every 1-2 hours. Cycles are regular in nature. She reports that she is currently holding her estrogen patches to prevent menstrual cycle due to how heavy her last menstrual cycle was- no longer currently bleeding.   On review of the previous records Yes Blood in stools: No Blood in urine: No Difficulty swallowing: No Pica: No Change of bowel movement/constipation: Yes but has hypothyroidism and IBS with C Prior blood transfusion: No Prior history of blood loss: No Liver disease: Yes- NASH dx years ago. LFTs have improved  Alcohol: Rare Bariatric surgery: No Vaginal bleeding: See above Prior evaluation with hematology: No Prior bone marrow biopsy: No Oral iron: No Prior IV iron infusions: No Family history Anemia: Possibly mother  Restless legs at night:Yes Fatigue: Yes Bruising: No   MEDICAL HISTORY:  Past Medical History:  Diagnosis Date   Allergy    Anxiety    Hematuria    Hip dysplasia    Hyperlipidemia    Hypertension    Migraine    NASH (nonalcoholic steatohepatitis)    Osteoporosis    Scoliosis    minimal   Thyroid disease    Turner syndrome    Vitamin D deficiency     SURGICAL HISTORY: Past Surgical History:  Procedure Laterality  Date   BILATERAL OOPHORECTOMY     HERNIA REPAIR     x2   HIP SURGERY     KYPHOPLASTY  08/2016   L2   TONSILLECTOMY     TYMPANOSTOMY TUBE PLACEMENT      SOCIAL HISTORY: Social History   Socioeconomic History   Marital status: Single    Spouse name: Not on file   Number of children: Not on file   Years of education: Not on file   Highest education level: Some college, no degree  Occupational History   Not on file  Tobacco Use   Smoking status: Never   Smokeless tobacco: Never  Vaping Use   Vaping Use: Never used  Substance and Sexual Activity   Alcohol use: Not Currently    Comment: socially   Drug use: No   Sexual activity: Not on file  Other Topics Concern   Not on file  Social History Narrative   Lives alone    Caffeine use: 2 cups daily   Social Determinants of Health   Financial Resource Strain: Not on file  Food Insecurity: Not on file  Transportation Needs: Not on file  Physical Activity: Not on file  Stress: Not on file  Social Connections: Not on file  Intimate Partner Violence: Not on file    FAMILY HISTORY: Family History  Problem Relation Age of Onset   Hypertension Mother    Arthritis Mother    Fibromyalgia Mother    Hyperlipidemia Mother    Diabetes Father  Diabetes Paternal Grandmother    Stroke Paternal Grandmother    Kidney disease Paternal Grandmother    Heart attack Paternal Grandmother    Hypertension Paternal Grandmother    Hyperlipidemia Paternal Grandmother    Diabetes Paternal Grandfather    Alcohol abuse Maternal Grandfather    Cancer Maternal Grandfather        BRAIN    ALLERGIES:  is allergic to tape and prednisone.  MEDICATIONS:  Current Outpatient Medications  Medication Sig Dispense Refill   albuterol (PROVENTIL HFA;VENTOLIN HFA) 108 (90 Base) MCG/ACT inhaler Inhale 2 puffs into the lungs every 6 (six) hours as needed for wheezing. 1 Inhaler 12   amLODipine (NORVASC) 5 MG tablet Take 1 tablet (5 mg total) by  mouth daily. 180 tablet 3   Cholecalciferol 20 MCG (800 UNIT) TABS Take 1 tablet by mouth daily. 90 tablet 0   estradiol (CLIMARA - DOSED IN MG/24 HR) 0.1 mg/24hr patch Place 1 patch (0.1 mg total) onto the skin once a week. 12 patch 0   fexofenadine (ALLEGRA) 60 MG tablet Take 60 mg by mouth daily.       Fluticasone-Salmeterol (ADVAIR DISKUS) 100-50 MCG/DOSE AEPB Inhale 1 puff into the lungs 2 (two) times daily. Inhale 1 puff into lungs bid 60 each 3   levothyroxine (SYNTHROID) 112 MCG tablet Take 1 tablet (112 mcg total) by mouth daily. 30 tablet 3   loperamide (IMODIUM A-D) 2 MG tablet Take 1-2 tablets (2-4 mg total) by mouth 3 (three) times daily as needed (prior to meals to rpevent diarrhea). 90 tablet 1   medroxyPROGESTERone (PROVERA) 10 MG tablet TAKE 1 TABLET BY MOUTH EVERY DAY FOR 10 DAYS PER MONTH 30 tablet 3   Multiple Vitamin (MULTIVITAMIN) tablet Take 1 tablet by mouth daily.       naratriptan (AMERGE) 2.5 MG tablet TAKE 1 TABLET AS NEEDED MIGRAINE MAY REPEAT AFTER 2 HOURS MAX 2 PER 24 HOURS.  will need follow up before any more refills approved call [phone]. 12 tablet 6   nortriptyline (PAMELOR) 50 MG capsule Take 1 capsule (50 mg total) by mouth at bedtime. 90 capsule 3   omeprazole (PRILOSEC) 40 MG capsule TAKE 1 CAPSULE BY MOUTH EVERY DAY 30 capsule 0   ondansetron (ZOFRAN-ODT) 8 MG disintegrating tablet Take 1 tablet (8 mg total) by mouth every 8 (eight) hours as needed (prior to meals to rpevent diarrhea). 90 tablet 1   promethazine (PHENERGAN) 12.5 MG tablet Take 1 tablet (12.5 mg total) by mouth every 6 (six) hours as needed for nausea or vomiting. 30 tablet 11   propranolol ER (INDERAL LA) 60 MG 24 hr capsule Take 1 capsule (60 mg total) by mouth daily. 90 capsule 6   tizanidine (ZANAFLEX) 2 MG capsule Take 1 capsule (2 mg total) by mouth 3 (three) times daily as needed for muscle spasms. 60 capsule 11   No current facility-administered medications for this visit.     REVIEW OF SYSTEMS:   Constitutional: ( - ) fevers, ( - )  chills , ( - ) night sweats Eyes: ( - ) blurriness of vision, ( - ) double vision, ( - ) watery eyes Ears, nose, mouth, throat, and face: ( - ) mucositis, ( - ) sore throat Respiratory: ( - ) cough, ( - ) dyspnea, ( - ) wheezes Cardiovascular: ( - ) palpitation, ( - ) chest discomfort, ( - ) lower extremity swelling Gastrointestinal:  ( - ) nausea, ( - ) heartburn, ( +) change  in bowel habits Skin: ( - ) abnormal skin rashes Lymphatics: ( - ) new lymphadenopathy, ( - ) easy bruising Neurological: ( - ) numbness, ( - ) tingling, ( - ) new weaknesses Behavioral/Psych: ( - ) mood change, ( - ) new changes  All other systems were reviewed with the patient and are negative.  PHYSICAL EXAMINATION: ECOG PERFORMANCE STATUS: 1 - Symptomatic but completely ambulatory  Vitals:   02/11/22 1056  BP: 111/79  Pulse: 82  Resp: 17  Temp: 98.5 F (36.9 C)  SpO2: 100%   Filed Weights   02/11/22 1056  Weight: 174 lb 1.3 oz (79 kg)    GENERAL: well appearing female in NAD  SKIN: skin color, texture, turgor are normal, no rashes or significant lesions EYES: conjunctiva are pink and non-injected, sclera clear OROPHARYNX: no exudate, no erythema; lips, buccal mucosa with mild pallor, and tongue normal  NECK: supple, non-tender LYMPH:  no palpable lymphadenopathy in the cervical, axillary or supraclavicular lymph nodes.  LUNGS: clear to auscultation and percussion with normal breathing effort HEART: regular rate & rhythm and no murmurs and no lower extremity edema ABDOMEN: soft, non-tender, non-distended, normal bowel sounds, possible scant hepatomegaly without tenderness Musculoskeletal: no cyanosis of digits and no clubbing  PSYCH: alert & oriented x 3, fluent speech NEURO: no focal motor/sensory deficits  LABORATORY DATA:  I have reviewed the data as listed    Latest Ref Rng & Units 02/11/2022   10:41 AM 01/27/2022    9:48 AM  07/25/2019   12:13 PM  CBC  WBC 4.0 - 10.5 K/uL 6.1  6.3  6.3   Hemoglobin 12.0 - 15.0 g/dL 9.6  9.9  12.8   Hematocrit 36.0 - 46.0 % 32.5  33.4  38.6   Platelets 150 - 400 K/uL 349  335  259        Latest Ref Rng & Units 01/27/2022    9:48 AM 07/25/2019   12:13 PM 03/12/2018    1:34 PM  CMP  Glucose 65 - 99 mg/dL 91  89  85   BUN 7 - 25 mg/dL _0 Creatinine 0.50 - 0.97 mg/dL 0.90  0.80  0.90   Sodium 135 - 146 mmol/L 138  138  140   Potassium 3.5 - 5.3 mmol/L 4.0  4.1  4.0   Chloride 98 - 110 mmol/L 104  104  104   CO2 20 - 32 mmol/L _1 Calcium 8.6 - 10.2 mg/dL 8.8  9.2  9.0   Total Protein 6.1 - 8.1 g/dL 7.2  6.9  6.7   Total Bilirubin 0.2 - 1.2 mg/dL 0.4  0.6  0.7   AST 10 - 30 U/L _2 ALT 6 - 29 U/L _3 PATHOLOGY: Appointment on 02/11/2022  Component Date Value Ref Range Status   WBC 02/11/2022 6.1  4.0 - 10.5 K/uL Final   RBC 02/11/2022 4.72  3.87 - 5.11 MIL/uL Final   Hemoglobin 02/11/2022 9.6 (L)  12.0 - 15.0 g/dL Final   Comment: Reticulocyte Hemoglobin testing may be clinically indicated, consider ordering this additional test [id number]    HCT 02/11/2022 32.5 (L)  36.0 - 46.0 % Final   MCV 02/11/2022 68.9 (L)  80.0 - 100.0 fL Final   MCH 02/11/2022 20.3 (L)  26.0 - 34.0 pg Final   MCHC 02/11/2022 29.5 (L)  30.0 - 36.0 g/dL Final   RDW 02/11/2022 20.0 (H)  11.5 - 15.5 % Final   Platelets 02/11/2022 349  150 - 400 K/uL Final   nRBC 02/11/2022 0.0  0.0 - 0.2 % Final   Neutrophils Relative % 02/11/2022 44  % Final   Neutro Abs 02/11/2022 2.7  1.7 - 7.7 K/uL Final   Lymphocytes Relative 02/11/2022 42  % Final   Lymphs Abs 02/11/2022 2.6  0.7 - 4.0 K/uL Final   Monocytes Relative 02/11/2022 9  % Final   Monocytes Absolute 02/11/2022 0.5  0.1 - 1.0 K/uL Final   Eosinophils Relative 02/11/2022 3  % Final   Eosinophils Absolute 02/11/2022 0.2  0.0 - 0.5 K/uL Final   Basophils Relative 02/11/2022 1  % Final   Basophils Absolute  02/11/2022 0.1  0.0 - 0.1 K/uL Final   WBC Morphology 02/11/2022 MORPHOLOGY UNREMARKABLE   Corrected   RBC Morphology 02/11/2022 MORPHOLOGY UNREMARKABLE   Corrected   Smear Review 02/11/2022 Normal platelet morphology   Corrected   PLATELET COUNT CONFIRMED BY SMEAR   Immature Granulocytes 02/11/2022 1  % Final   Abs Immature Granulocytes 02/11/2022 0.04  0.00 - 0.07 K/uL Final   Performed at [facility], [address], [place], [place] [address]    RADIOGRAPHIC STUDIES: N/A  ASSESSMENT & PLAN Encounter Diagnoses  Name Primary?   Iron deficiency anemia, unspecified iron deficiency anemia type Yes   NASH (nonalcoholic steatohepatitis)    Anemia: Anemia with iron deficiency with known heavy menstrual bleeding. Not currently on iron supplementation. Will trial gentle oral iron x 1 month along with referral to GYN to discuss her heavy menses which appear to be the cause of her iron deficiency and anemia. Discussed red flag signs and symptoms along with discussions about iron side effects and that the best time to take this is first thing in the morning with orange juice. We also discussed IV iron should her levels fail to improve with oral supplementation.  NASH: Chronic in nature. Liver US to further assess.   Disposition: Starting gentle iron, GYN referral placed RTC 1 month APP with labs +- Venofer    Orders Placed This Encounter  Procedures   CBC with Differential    Standing Status:   Future    Number of Occurrences:   1    Standing Expiration Date:   02/11/2023    All questions were answered. The patient knows to call the clinic with any problems, questions or concerns.  I have spent a total of 25 minutes minutes of face-to-face and non-face-to-face time, preparing to see the patient, obtaining and/or reviewing separately obtained history, performing a medically appropriate examination, counseling and educating the patient, ordering  medications/tests/procedures, referring and communicating with other health care professionals, documenting clinical information in the electronic health record, independently interpreting results and communicating results to the patient, and care coordination.    Nelwyn Salisbury PA-C Department of Hematology/Oncology [facility]

## 2022-02-13 LAB — CBC
HCT: 33.4 % — ABNORMAL LOW (ref 35.0–45.0)
Hemoglobin: 9.9 g/dL — ABNORMAL LOW (ref 11.7–15.5)
MCH: 20.9 pg — ABNORMAL LOW (ref 27.0–33.0)
MCHC: 29.6 g/dL — ABNORMAL LOW (ref 32.0–36.0)
MCV: 70.5 fL — ABNORMAL LOW (ref 80.0–100.0)
MPV: 8.9 fL (ref 7.5–12.5)
Platelets: 335 10*3/uL (ref 140–400)
RBC: 4.74 10*6/uL (ref 3.80–5.10)
RDW: 19.4 % — ABNORMAL HIGH (ref 11.0–15.0)
WBC: 6.3 10*3/uL (ref 3.8–10.8)

## 2022-02-13 LAB — LIPID PANEL
Cholesterol: 251 mg/dL — ABNORMAL HIGH (ref <200)
HDL: 40 mg/dL — ABNORMAL LOW (ref >=50)
LDL Cholesterol (Calc): 175 mg/dL (calc) — ABNORMAL HIGH
Non-HDL Cholesterol (Calc): 211 mg/dL (calc) — ABNORMAL HIGH (ref <130)
Total CHOL/HDL Ratio: 6.3 (calc) — ABNORMAL HIGH (ref <5.0)
Triglycerides: 196 mg/dL — ABNORMAL HIGH (ref <150)

## 2022-02-13 LAB — IRON,TIBC AND FERRITIN PANEL
%SAT: 3 % (calc) — ABNORMAL LOW (ref 16–45)
Ferritin: 4 ng/mL — ABNORMAL LOW (ref 16–154)
Iron: 16 ug/dL — ABNORMAL LOW (ref 40–190)
TIBC: 480 mcg/dL (calc) — ABNORMAL HIGH (ref 250–450)

## 2022-02-13 LAB — COMPLETE METABOLIC PANEL WITH GFR
AG Ratio: 1.6 (calc) (ref 1.0–2.5)
ALT: 31 U/L — ABNORMAL HIGH (ref 6–29)
AST: 26 U/L (ref 10–30)
Albumin: 4.4 g/dL (ref 3.6–5.1)
Alkaline phosphatase (APISO): 84 U/L (ref 31–125)
BUN: 9 mg/dL (ref 7–25)
CO2: 25 mmol/L (ref 20–32)
Calcium: 8.8 mg/dL (ref 8.6–10.2)
Chloride: 104 mmol/L (ref 98–110)
Creat: 0.9 mg/dL (ref 0.50–0.97)
Globulin: 2.8 g/dL (calc) (ref 1.9–3.7)
Glucose, Bld: 91 mg/dL (ref 65–99)
Potassium: 4 mmol/L (ref 3.5–5.3)
Sodium: 138 mmol/L (ref 135–146)
Total Bilirubin: 0.4 mg/dL (ref 0.2–1.2)
Total Protein: 7.2 g/dL (ref 6.1–8.1)
eGFR: 84 mL/min/{1.73_m2} (ref >=60)

## 2022-02-13 LAB — TEST AUTHORIZATION

## 2022-02-13 LAB — VITAMIN D 25 HYDROXY (VIT D DEFICIENCY, FRACTURES): Vit D, 25-Hydroxy: 16 ng/mL — ABNORMAL LOW (ref 30–100)

## 2022-02-13 LAB — TSH+FREE T4: TSH W/REFLEX TO FT4: 23.35 mIU/L — ABNORMAL HIGH

## 2022-02-13 LAB — T4, FREE: Free T4: 1 ng/dL (ref 0.8–1.8)

## 2022-02-18 ENCOUNTER — Ambulatory Visit (HOSPITAL_BASED_OUTPATIENT_CLINIC_OR_DEPARTMENT_OTHER)
Admission: RE | Admit: 2022-02-18 | Discharge: 2022-02-18 | Disposition: A | Discharge status: Home / Self Care | Payer: No Typology Code available for payment source | Source: Ambulatory Visit | Attending: Medical Oncology | Admitting: Medical Oncology

## 2022-02-18 ENCOUNTER — Other Ambulatory Visit: Payer: Self-pay | Admitting: Medical Oncology

## 2022-02-18 DIAGNOSIS — K7581 Nonalcoholic steatohepatitis (NASH): Secondary | ICD-10-CM | POA: Insufficient documentation

## 2022-02-18 DIAGNOSIS — D509 Iron deficiency anemia, unspecified: Secondary | ICD-10-CM | POA: Insufficient documentation

## 2022-02-20 ENCOUNTER — Ambulatory Visit: Payer: 59 | Admitting: Family Medicine

## 2022-03-03 ENCOUNTER — Ambulatory Visit (INDEPENDENT_AMBULATORY_CARE_PROVIDER_SITE_OTHER): Payer: No Typology Code available for payment source | Admitting: Family Medicine

## 2022-03-03 ENCOUNTER — Encounter: Payer: Self-pay | Admitting: Family Medicine

## 2022-03-03 VITALS — BP 101/69 | HR 82 | Ht 62.5 in | Wt 174.0 lb

## 2022-03-03 DIAGNOSIS — H9201 Otalgia, right ear: Secondary | ICD-10-CM | POA: Diagnosis not present

## 2022-03-03 DIAGNOSIS — M791 Myalgia, unspecified site: Secondary | ICD-10-CM

## 2022-03-03 DIAGNOSIS — I10 Essential (primary) hypertension: Secondary | ICD-10-CM

## 2022-03-03 DIAGNOSIS — E559 Vitamin D deficiency, unspecified: Secondary | ICD-10-CM

## 2022-03-03 DIAGNOSIS — E039 Hypothyroidism, unspecified: Secondary | ICD-10-CM | POA: Diagnosis not present

## 2022-03-03 DIAGNOSIS — Q969 Turner's syndrome, unspecified: Secondary | ICD-10-CM

## 2022-03-03 NOTE — Assessment & Plan Note (Signed)
-   pt taking vit d supplement, will follow up levels to make sure they are rising appropriately

## 2022-03-03 NOTE — Assessment & Plan Note (Signed)
-  will order ESR and CRP since pt is having myalgias and I am concerned about other autoimmune disorders.  - this could also be due to iron deficiency with levels still low or uncontrolled hypothyroidism

## 2022-03-03 NOTE — Assessment & Plan Note (Signed)
-   due to episode of dizziness and low blood pressure will go ahead and decrease her amlodipine to 2.39m daily. If BP remains low we can discontinue BP meds

## 2022-03-03 NOTE — Assessment & Plan Note (Signed)
-   based on physical exam this is most likely related to fluid in her ears. Recommend allergy medication and flonase. TM had good cone of light b/l

## 2022-03-03 NOTE — Progress Notes (Signed)
Established patient visit   Patient: Cristina Zimmerman   DOB: Jul 27, 1985   36 y.o. Female  MRN: [id number] Visit Date: 03/03/2022  Today's healthcare provider: Owens Loffler, DO   Chief Complaint  Patient presents with   Follow-up    SUBJECTIVE    Chief Complaint  Patient presents with   Follow-up   HPI  Pt presents for follow up.   Hypothyroidism At last visit, her TSH was elevated and synthroid was increased to 173mg. She notes some improvement in fatigue. She is due for TSH check today for dose adjustment monitoring.  Hematology  She is currently following with hematology about her iron deficiency anemia. Heme thinks this is likely related to her menstrual cycle and has sent her to gynecology for further workup. She is taking her iron supplements.   HTN Pt is currently on amlodipine 577m BP today is 101/69 and two weeks ago from epic review her BP was 111 SBPs. She has noted some periods of dizziness and weakness.   Muscle pain  Pt admits to muscle weakness that has been occurring for about 2-3 months.   Review of Systems  Constitutional:  Positive for fatigue. Negative for activity change and fever.  HENT:  Positive for ear pain.   Respiratory:  Negative for cough and shortness of breath.   Cardiovascular:  Negative for chest pain.  Gastrointestinal:  Negative for abdominal pain.  Genitourinary:  Negative for difficulty urinating.  Musculoskeletal:  Positive for myalgias.       Current Meds  Medication Sig   albuterol (PROVENTIL HFA;VENTOLIN HFA) 108 (90 Base) MCG/ACT inhaler Inhale 2 puffs into the lungs every 6 (six) hours as needed for wheezing.   amLODipine (NORVASC) 5 MG tablet Take 1 tablet (5 mg total) by mouth daily.   Cholecalciferol 20 MCG (800 UNIT) TABS Take 1 tablet by mouth daily.   estradiol (CLIMARA - DOSED IN MG/24 HR) 0.1 mg/24hr patch Place 1 patch (0.1 mg total) onto the skin once a week.   Fe Bisgly-Vit C-Vit B12-FA (GENTLE IRON)  28-60-0.008-0.4 MG CAPS Take 1 capsule by mouth in the morning.   fexofenadine (ALLEGRA) 60 MG tablet Take 60 mg by mouth daily.     Fluticasone-Salmeterol (ADVAIR DISKUS) 100-50 MCG/DOSE AEPB Inhale 1 puff into the lungs 2 (two) times daily. Inhale 1 puff into lungs bid   levothyroxine (SYNTHROID) 112 MCG tablet Take 1 tablet (112 mcg total) by mouth daily.   loperamide (IMODIUM A-D) 2 MG tablet Take 1-2 tablets (2-4 mg total) by mouth 3 (three) times daily as needed (prior to meals to rpevent diarrhea).   medroxyPROGESTERone (PROVERA) 10 MG tablet TAKE 1 TABLET BY MOUTH EVERY DAY FOR 10 DAYS PER MONTH   Multiple Vitamin (MULTIVITAMIN) tablet Take 1 tablet by mouth daily.     naratriptan (AMERGE) 2.5 MG tablet TAKE 1 TABLET AS NEEDED MIGRAINE MAY REPEAT AFTER 2 HOURS MAX 2 PER 24 HOURS.  will need follow up before any more refills approved call [phone]  nortriptyline (PAMELOR) 50 MG capsule Take 1 capsule (50 mg total) by mouth at bedtime.   omeprazole (PRILOSEC) 40 MG capsule TAKE 1 CAPSULE BY MOUTH EVERY DAY   ondansetron (ZOFRAN-ODT) 8 MG disintegrating tablet Take 1 tablet (8 mg total) by mouth every 8 (eight) hours as needed (prior to meals to rpevent diarrhea).   promethazine (PHENERGAN) 12.5 MG tablet Take 1 tablet (12.5 mg total) by mouth every 6 (six) hours as needed for nausea  or vomiting.   propranolol ER (INDERAL LA) 60 MG 24 hr capsule Take 1 capsule (60 mg total) by mouth daily.   tizanidine (ZANAFLEX) 2 MG capsule Take 1 capsule (2 mg total) by mouth 3 (three) times daily as needed for muscle spasms.    OBJECTIVE    BP 101/69   Pulse 82   Ht 5' 2.5" (1.588 m)   Wt 174 lb (78.9 kg)   SpO2 100%   BMI 31.32 kg/m   Physical Exam Vitals and nursing note reviewed.  Constitutional:      General: She is not in acute distress.    Appearance: Normal appearance.  HENT:     Head: Normocephalic and atraumatic.     Right Ear: Tympanic membrane, ear canal and external ear  normal.     Left Ear: Tympanic membrane, ear canal and external ear normal.     Ears:     Comments: Fluid seen on ears bilaterally    Nose: Nose normal.  Eyes:     Conjunctiva/sclera: Conjunctivae normal.  Cardiovascular:     Rate and Rhythm: Normal rate and regular rhythm.  Pulmonary:     Effort: Pulmonary effort is normal.     Breath sounds: Normal breath sounds.  Neurological:     General: No focal deficit present.     Mental Status: She is alert and oriented to person, place, and time.  Psychiatric:        Mood and Affect: Mood normal.        Behavior: Behavior normal.        Thought Content: Thought content normal.        Judgment: Judgment normal.        ASSESSMENT & PLAN    Problem List Items Addressed This Visit       Cardiovascular and Mediastinum   Hypertension    - due to episode of dizziness and low blood pressure will go ahead and decrease her amlodipine to 2.38m daily. If BP remains low we can discontinue BP meds        Endocrine   Hypothyroidism - Primary    - fatigue is improving which could be due to more appropriate synthroid dosing  - repeat TSH, will dose adjust as needed       Relevant Orders   TSH   Ambulatory referral to Endocrinology     Other   Turner syndrome    - have sent referral to endocrinology       Vitamin D deficiency    - pt taking vit d supplement, will follow up levels to make sure they are rising appropriately      Relevant Orders   Vitamin D (25 hydroxy)   Myalgia    - will order ESR and CRP since pt is having myalgias and I am concerned about other autoimmune disorders.  - this could also be due to iron deficiency with levels still low or uncontrolled hypothyroidism      Relevant Orders   Sed Rate (ESR)   C-reactive protein   Right ear pain    - based on physical exam this is most likely related to fluid in her ears. Recommend allergy medication and flonase. TM had good cone of light b/l       Return pending  blood work.      No orders of the defined types were placed in this encounter.   Orders Placed This Encounter  Procedures   TSH   Vitamin D (  25 hydroxy)   Sed Rate (ESR)   C-reactive protein   Ambulatory referral to Endocrinology    Referral Priority:   Routine    Referral Type:   Consultation    Referral Reason:   Specialty Services Required    Number of Visits Requested:   [place], [place] [phone] (phone) [phone] (fax)  [place]

## 2022-03-03 NOTE — Assessment & Plan Note (Signed)
-   have sent referral to endocrinology

## 2022-03-03 NOTE — Assessment & Plan Note (Addendum)
-   fatigue is improving which could be due to more appropriate synthroid dosing  - repeat TSH, will dose adjust as needed

## 2022-03-04 ENCOUNTER — Other Ambulatory Visit: Payer: Self-pay | Admitting: Family Medicine

## 2022-03-04 LAB — VITAMIN D 25 HYDROXY (VIT D DEFICIENCY, FRACTURES): Vit D, 25-Hydroxy: 20 ng/mL — ABNORMAL LOW (ref 30–100)

## 2022-03-04 LAB — C-REACTIVE PROTEIN: CRP: 1.1 mg/L (ref <8.0)

## 2022-03-04 LAB — TSH: TSH: 4.71 mIU/L — ABNORMAL HIGH

## 2022-03-04 LAB — SEDIMENTATION RATE: Sed Rate: 11 mm/h (ref 0–20)

## 2022-03-04 MED ORDER — LEVOTHYROXINE SODIUM 125 MCG PO TABS: 125.0000 ug | ORAL_TABLET | Freq: Every day | ORAL | 3 refills | Status: DC

## 2022-03-06 ENCOUNTER — Telehealth: Payer: Self-pay | Admitting: *Deleted

## 2022-03-06 NOTE — Telephone Encounter (Signed)
Called and lvm of rescheduling appointment - requested callback to confirm

## 2022-03-10 ENCOUNTER — Other Ambulatory Visit: Payer: Self-pay | Admitting: *Deleted

## 2022-03-10 DIAGNOSIS — D509 Iron deficiency anemia, unspecified: Secondary | ICD-10-CM

## 2022-03-11 ENCOUNTER — Inpatient Hospital Stay: Payer: No Typology Code available for payment source | Admitting: Medical Oncology

## 2022-03-11 ENCOUNTER — Ambulatory Visit: Payer: No Typology Code available for payment source

## 2022-03-11 ENCOUNTER — Inpatient Hospital Stay: Payer: No Typology Code available for payment source

## 2022-03-19 ENCOUNTER — Ambulatory Visit
Admission: RE | Admit: 2022-03-19 | Discharge: 2022-03-19 | Disposition: A | Discharge status: Home / Self Care | Payer: No Typology Code available for payment source | Source: Ambulatory Visit | Attending: Family Medicine | Admitting: Family Medicine

## 2022-03-19 VITALS — BP 116/78 | HR 104 | Temp 98.3°F | Resp 17

## 2022-03-19 DIAGNOSIS — J4541 Moderate persistent asthma with (acute) exacerbation: Secondary | ICD-10-CM

## 2022-03-19 DIAGNOSIS — J209 Acute bronchitis, unspecified: Secondary | ICD-10-CM | POA: Diagnosis not present

## 2022-03-19 MED ORDER — AMOXICILLIN-POT CLAVULANATE 875-125 MG PO TABS: 1.0000 | ORAL_TABLET | Freq: Two times a day (BID) | ORAL | 0 refills | Status: DC

## 2022-03-19 NOTE — Discharge Instructions (Signed)
Use the Advair twice a day until your symptoms improve Continue albuterol as needed Drink lots of water Take the antibiotic 2 times a day for a week

## 2022-03-19 NOTE — ED Triage Notes (Signed)
Pt c/o cough (mostly non productive)  x 1 week. Worsening in last 2 days. Some drainage. Slight RT ear pain. Denies fever. COVID at home neg on Day 2 of sxs. Taking mucinex DM and benedryl prn. Also using albuterol and advair prn.

## 2022-03-19 NOTE — ED Provider Notes (Signed)
[facility]    CSN: [id number] Arrival date & time: 03/19/22  1254      History   Chief Complaint Chief Complaint  Patient presents with   Cough    APPT 1PM    HPI Cristina Zimmerman is a 36 y.o. female.   HPI  Patient has underlying asthma.  She also has some allergies.  She is on albuterol and Advair.  She currently is here for a cough is getting worse over the last week.  States she is developed some ear pain.  Scratchy throat.  Fatigue.  Past Medical History:  Diagnosis Date   Allergy    Anxiety    Hematuria    Hip dysplasia    Hyperlipidemia    Hypertension    Migraine    NASH (nonalcoholic steatohepatitis)    Osteoporosis    Scoliosis    minimal   Thyroid disease    Turner syndrome    Vitamin D deficiency     Patient Active Problem List   Diagnosis Date Noted   Myalgia 03/03/2022   Right ear pain 03/03/2022   Depression 06/14/2018   Vertebral compression fracture, initial encounter 08/22/2016   Chronic migraine without aura with status migrainosus, not intractable 12/03/2015   Migraine with aura and with status migrainosus 12/03/2015   Migraine with aura and without status migrainosus, not intractable 11/22/2015   Hypothyroidism 07/22/2012   Childhood asthma 07/20/2012   Bronchospasm 07/20/2012   Allergy    Anxiety    Turner syndrome    Hip dysplasia    Vitamin D deficiency    Hematuria    Familial hyperlipidemia, high LDL    Hypertension    Osteoporosis    NASH (nonalcoholic steatohepatitis)    Scoliosis     Past Surgical History:  Procedure Laterality Date   BILATERAL OOPHORECTOMY     HERNIA REPAIR     x2   HIP SURGERY     KYPHOPLASTY  08/2016   L2   TONSILLECTOMY     TYMPANOSTOMY TUBE PLACEMENT      OB History   No obstetric history on file.      Home Medications    Prior to Admission medications   Medication Sig Start Date End Date Taking? Authorizing Provider  amoxicillin-clavulanate (AUGMENTIN) 875-125 MG  tablet Take 1 tablet by mouth every 12 (twelve) hours. 03/19/22  Yes Raylene Everts, MD  levothyroxine (SYNTHROID) 125 MCG tablet Take 1 tablet (125 mcg total) by mouth daily. 03/04/22   Owens Loffler, DO  albuterol (PROVENTIL HFA;VENTOLIN HFA) 108 (90 Base) MCG/ACT inhaler Inhale 2 puffs into the lungs every 6 (six) hours as needed for wheezing. 12/30/16   Melvenia Beam, MD  amLODipine (NORVASC) 5 MG tablet Take 1 tablet (5 mg total) by mouth daily. 04/25/21   Melvenia Beam, MD  Cholecalciferol 20 MCG (800 UNIT) TABS Take 1 tablet by mouth daily. 01/28/22   Owens Loffler, DO  estradiol (CLIMARA - DOSED IN MG/24 HR) 0.1 mg/24hr patch Place 1 patch (0.1 mg total) onto the skin once a week. 01/27/22   Owens Loffler, DO  Fe Bisgly-Vit C-Vit B12-FA (GENTLE IRON) 28-60-0.008-0.4 MG CAPS Take 1 capsule by mouth in the morning. 02/11/22   Hughie Closs, PA-C  fexofenadine (ALLEGRA) 60 MG tablet Take 60 mg by mouth daily.      [provider]  Fluticasone-Salmeterol (ADVAIR DISKUS) 100-50 MCG/DOSE AEPB Inhale 1 puff into the lungs 2 (two) times daily.  Inhale 1 puff into lungs bid 12/30/16   Melvenia Beam, MD  loperamide (IMODIUM A-D) 2 MG tablet Take 1-2 tablets (2-4 mg total) by mouth 3 (three) times daily as needed (prior to meals to rpevent diarrhea). 03/10/18   Emeterio Reeve, DO  medroxyPROGESTERone (PROVERA) 10 MG tablet TAKE 1 TABLET BY MOUTH EVERY DAY FOR 10 DAYS PER MONTH 04/05/20   Emeterio Reeve, DO  Multiple Vitamin (MULTIVITAMIN) tablet Take 1 tablet by mouth daily.      [provider]  naratriptan (AMERGE) 2.5 MG tablet TAKE 1 TABLET AS NEEDED MIGRAINE MAY REPEAT AFTER 2 HOURS MAX 2 PER 24 HOURS.  will need follow up before any more refills approved call [phone]. 04/25/21   Melvenia Beam, MD  nortriptyline (PAMELOR) 50 MG capsule Take 1 capsule (50 mg total) by mouth at bedtime. 04/25/21   Melvenia Beam, MD  omeprazole (PRILOSEC) 40 MG capsule  TAKE 1 CAPSULE BY MOUTH EVERY DAY 03/01/20   Emeterio Reeve, DO  ondansetron (ZOFRAN-ODT) 8 MG disintegrating tablet Take 1 tablet (8 mg total) by mouth every 8 (eight) hours as needed (prior to meals to rpevent diarrhea). 03/10/18   Emeterio Reeve, DO  promethazine (PHENERGAN) 12.5 MG tablet Take 1 tablet (12.5 mg total) by mouth every 6 (six) hours as needed for nausea or vomiting. 04/25/21   Melvenia Beam, MD  propranolol ER (INDERAL LA) 60 MG 24 hr capsule Take 1 capsule (60 mg total) by mouth daily. 04/25/21   Melvenia Beam, MD  tizanidine (ZANAFLEX) 2 MG capsule Take 1 capsule (2 mg total) by mouth 3 (three) times daily as needed for muscle spasms. 04/25/21   Melvenia Beam, MD    Family History Family History  Problem Relation Age of Onset   Hypertension Mother    Arthritis Mother    Fibromyalgia Mother    Hyperlipidemia Mother    Diabetes Father    Diabetes Paternal Grandmother    Stroke Paternal Grandmother    Kidney disease Paternal Grandmother    Heart attack Paternal Grandmother    Hypertension Paternal Grandmother    Hyperlipidemia Paternal Grandmother    Diabetes Paternal Grandfather    Alcohol abuse Maternal Grandfather    Cancer Maternal Grandfather        BRAIN    Social History Social History   Tobacco Use   Smoking status: Never   Smokeless tobacco: Never  Vaping Use   Vaping Use: Never used  Substance Use Topics   Alcohol use: Not Currently    Comment: socially   Drug use: No     Allergies   Tape and Prednisone   Review of Systems Review of Systems  See HPI Physical Exam Triage Vital Signs ED Triage Vitals  Enc Vitals Group     BP 03/19/22 1318 116/78     Pulse Rate 03/19/22 1318 (!) 104     Resp 03/19/22 1318 17     Temp 03/19/22 1318 98.3 F (36.8 C)     Temp Source 03/19/22 1318 Oral     SpO2 03/19/22 1318 100 %     Weight --      Height --      Head Circumference --      Peak Flow --      Pain Score 03/19/22 1319 0      Pain Loc --      Pain Edu? --      Excl. in [place]? --  No data found.  Updated Vital Signs BP 116/78 (BP Location: Right Arm)   Pulse (!) 104   Temp 98.3 F (36.8 C) (Oral)   Resp 17   SpO2 100%      Physical Exam Constitutional:      General: She is not in acute distress.    Appearance: She is well-developed.  HENT:     Head: Normocephalic and atraumatic.     Right Ear: Tympanic membrane normal.     Left Ear: Tympanic membrane and ear canal normal.     Nose: Congestion present.     Mouth/Throat:     Pharynx: Posterior oropharyngeal erythema present.  Eyes:     Conjunctiva/sclera: Conjunctivae normal.     Pupils: Pupils are equal, round, and reactive to light.  Cardiovascular:     Rate and Rhythm: Normal rate and regular rhythm.     Heart sounds: Normal heart sounds.  Pulmonary:     Effort: Pulmonary effort is normal. No respiratory distress.     Breath sounds: Wheezing present.  Abdominal:     General: There is no distension.     Palpations: Abdomen is soft.  Musculoskeletal:        General: Normal range of motion.     Cervical back: Normal range of motion.  Skin:    General: Skin is warm and dry.  Neurological:     Mental Status: She is alert.  Psychiatric:        Mood and Affect: Mood normal.        Behavior: Behavior normal.      UC Treatments / Results  Labs (all labs ordered are listed, but only abnormal results are displayed) Labs Reviewed - No data to display  EKG   Radiology No results found.  Procedures Procedures (including critical care time)  Medications Ordered in UC Medications - No data to display  Initial Impression / Assessment and Plan / UC Course  I have reviewed the triage vital signs and the nursing notes.  Pertinent labs & imaging results that were available during my care of the patient were reviewed by me and considered in my medical decision making (see chart for details).     Final Clinical Impressions(s) / UC  Diagnoses   Final diagnoses:  Moderate persistent asthma with exacerbation  Acute bronchitis, unspecified organism     Discharge Instructions      Use the Advair twice a day until your symptoms improve Continue albuterol as needed Drink lots of water Take the antibiotic 2 times a day for a week   ED Prescriptions     Medication Sig Dispense Auth. Provider   amoxicillin-clavulanate (AUGMENTIN) 875-125 MG tablet Take 1 tablet by mouth every 12 (twelve) hours. 14 tablet Raylene Everts, MD      PDMP not reviewed this encounter.   Raylene Everts, MD 03/19/22 [phone]

## 2022-03-20 ENCOUNTER — Telehealth: Payer: Self-pay

## 2022-03-20 NOTE — Telephone Encounter (Signed)
TCT pt to follow up from recent visit. Pt denies any needs at this time.

## 2022-04-03 ENCOUNTER — Other Ambulatory Visit: Payer: Self-pay | Admitting: Family Medicine

## 2022-04-03 DIAGNOSIS — Q969 Turner's syndrome, unspecified: Secondary | ICD-10-CM

## 2022-04-10 ENCOUNTER — Other Ambulatory Visit: Payer: Self-pay | Admitting: Family Medicine

## 2022-04-10 DIAGNOSIS — E039 Hypothyroidism, unspecified: Secondary | ICD-10-CM

## 2022-05-06 ENCOUNTER — Other Ambulatory Visit: Payer: Self-pay | Admitting: Neurology

## 2022-05-09 ENCOUNTER — Other Ambulatory Visit: Payer: Self-pay | Admitting: Neurology

## 2022-05-09 DIAGNOSIS — I10 Essential (primary) hypertension: Secondary | ICD-10-CM

## 2022-05-13 ENCOUNTER — Telehealth: Payer: Self-pay | Admitting: Neurology

## 2022-05-13 ENCOUNTER — Other Ambulatory Visit: Payer: Self-pay | Admitting: Neurology

## 2022-05-13 ENCOUNTER — Encounter: Payer: Self-pay | Admitting: Neurology

## 2022-05-13 DIAGNOSIS — I10 Essential (primary) hypertension: Secondary | ICD-10-CM

## 2022-05-13 MED ORDER — NARATRIPTAN HCL 2.5 MG PO TABS: ORAL_TABLET | ORAL | 2 refills | Status: DC

## 2022-05-13 MED ORDER — AMLODIPINE BESYLATE 5 MG PO TABS: 5.0000 mg | ORAL_TABLET | Freq: Every day | ORAL | 0 refills | Status: DC

## 2022-05-13 NOTE — Telephone Encounter (Signed)
error 

## 2022-05-15 ENCOUNTER — Ambulatory Visit: Payer: No Typology Code available for payment source | Admitting: Neurology

## 2022-05-15 MED ORDER — PROPRANOLOL HCL ER 60 MG PO CP24: 60.0000 mg | ORAL_CAPSULE | Freq: Every day | ORAL | 0 refills | Status: DC

## 2022-05-15 NOTE — Addendum Note (Signed)
Addended by: Gildardo Griffes on: 05/15/2022 04:06 PM   Modules accepted: Orders

## 2022-05-15 NOTE — Progress Notes (Deleted)
Patient: Cristina Zimmerman Date of Birth: 1985/10/04  Reason for Visit: Follow up History from: Patient Primary Neurologist: Jaynee Eagles    ASSESSMENT AND PLAN 37 y.o. year old female    HISTORY OF PRESENT ILLNESS: Today 05/15/22  HISTORY 04/25/2021: She does not feel Ajovy is helping. She tried it and had GI side effects, her stomach didn't like it. She stopped the Ajovy a year ago and did not tell us(side effects), she says she has been under stress and very busy. On average she has been having at least 8 migraine days a month that are severe or moderate the last 6 months and recently even worsening to 15 migraine days a month the last 3 months, pulsating/pounding/throbbing, nausea, photophobia/phonophobia, nausea, movement makes it worse, can be unilateral, propranolol helped a little bit but still with significant migraines, tizanidine helped, no medication overuse, no aura, discussed botox. Migraines can last 24-72 hours or longer. Failed multiple medications.    Medications tried for migraine include: Ajovy(side effects), topamax(depression), nortriptyline, amlodipine, propranolol, naratriptan, phenergan, tizanidine, nurtec, flexeril, lexapro, toradol, depo-medrol, reglan inj, amerge, zofran, prednisone, imitrex,   REVIEW OF SYSTEMS: Out of a complete 14 system review of symptoms, the patient complains only of the following symptoms, and all other reviewed systems are negative.  See HPI  ALLERGIES: Allergies  Allergen Reactions   Tape    Prednisone     PO prednisone only- Jittery, anxious, elevated blood pressure.   Pt does "okay" with steroid injections Jittery intolerance    HOME MEDICATIONS: Outpatient Medications Prior to Visit  Medication Sig Dispense Refill   levothyroxine (SYNTHROID) 125 MCG tablet Take 1 tablet (125 mcg total) by mouth daily. 60 tablet 3   albuterol (PROVENTIL HFA;VENTOLIN HFA) 108 (90 Base) MCG/ACT inhaler Inhale 2 puffs into the lungs every 6 (six)  hours as needed for wheezing. 1 Inhaler 12   amLODipine (NORVASC) 5 MG tablet Take 1 tablet (5 mg total) by mouth daily. 180 tablet 0   amoxicillin-clavulanate (AUGMENTIN) 875-125 MG tablet Take 1 tablet by mouth every 12 (twelve) hours. 14 tablet 0   Cholecalciferol 20 MCG (800 UNIT) TABS Take 1 tablet by mouth daily. 90 tablet 0   estradiol (CLIMARA - DOSED IN MG/24 HR) 0.1 mg/24hr patch PLACE 1 PATCH (0.1 MG TOTAL) ONTO THE SKIN ONCE A WEEK. 12 patch 0   Fe Bisgly-Vit C-Vit B12-FA (GENTLE IRON) 28-60-0.008-0.4 MG CAPS Take 1 capsule by mouth in the morning. 90 capsule 3   fexofenadine (ALLEGRA) 60 MG tablet Take 60 mg by mouth daily.       Fluticasone-Salmeterol (ADVAIR DISKUS) 100-50 MCG/DOSE AEPB Inhale 1 puff into the lungs 2 (two) times daily. Inhale 1 puff into lungs bid 60 each 3   loperamide (IMODIUM A-D) 2 MG tablet Take 1-2 tablets (2-4 mg total) by mouth 3 (three) times daily as needed (prior to meals to rpevent diarrhea). 90 tablet 1   medroxyPROGESTERone (PROVERA) 10 MG tablet TAKE 1 TABLET BY MOUTH EVERY DAY FOR 10 DAYS PER MONTH 30 tablet 3   Multiple Vitamin (MULTIVITAMIN) tablet Take 1 tablet by mouth daily.       naratriptan (AMERGE) 2.5 MG tablet TAKE 1 TABLET AS NEEDED MIGRAINE MAY REPEAT AFTER 2 HOURS MAX 2 PER 24 HOURS.  will need follow up before any more refills approved call [phone]. 12 tablet 2   nortriptyline (PAMELOR) 50 MG capsule Take 1 capsule (50 mg total) by mouth at bedtime. 90 capsule 3   omeprazole (PRILOSEC)  40 MG capsule TAKE 1 CAPSULE BY MOUTH EVERY DAY 30 capsule 0   ondansetron (ZOFRAN-ODT) 8 MG disintegrating tablet Take 1 tablet (8 mg total) by mouth every 8 (eight) hours as needed (prior to meals to rpevent diarrhea). 90 tablet 1   promethazine (PHENERGAN) 12.5 MG tablet Take 1 tablet (12.5 mg total) by mouth every 6 (six) hours as needed for nausea or vomiting. 30 tablet 11   propranolol ER (INDERAL LA) 60 MG 24 hr capsule Take 1 capsule (60 mg  total) by mouth daily. 90 capsule 6   tizanidine (ZANAFLEX) 2 MG capsule Take 1 capsule (2 mg total) by mouth 3 (three) times daily as needed for muscle spasms. 60 capsule 11   No facility-administered medications prior to visit.    PAST MEDICAL HISTORY: Past Medical History:  Diagnosis Date   Allergy    Anxiety    Hematuria    Hip dysplasia    Hyperlipidemia    Hypertension    Migraine    NASH (nonalcoholic steatohepatitis)    Osteoporosis    Scoliosis    minimal   Thyroid disease    Turner syndrome    Vitamin D deficiency     PAST SURGICAL HISTORY: Past Surgical History:  Procedure Laterality Date   BILATERAL OOPHORECTOMY     HERNIA REPAIR     x2   HIP SURGERY     KYPHOPLASTY  08/2016   L2   TONSILLECTOMY     TYMPANOSTOMY TUBE PLACEMENT      FAMILY HISTORY: Family History  Problem Relation Age of Onset   Hypertension Mother    Arthritis Mother    Fibromyalgia Mother    Hyperlipidemia Mother    Diabetes Father    Diabetes Paternal Grandmother    Stroke Paternal Grandmother    Kidney disease Paternal Grandmother    Heart attack Paternal Grandmother    Hypertension Paternal Grandmother    Hyperlipidemia Paternal Grandmother    Diabetes Paternal Grandfather    Alcohol abuse Maternal Grandfather    Cancer Maternal Grandfather        BRAIN    SOCIAL HISTORY: Social History   Socioeconomic History   Marital status: Single    Spouse name: Not on file   Number of children: Not on file   Years of education: Not on file   Highest education level: Some college, no degree  Occupational History   Not on file  Tobacco Use   Smoking status: Never   Smokeless tobacco: Never  Vaping Use   Vaping Use: Never used  Substance and Sexual Activity   Alcohol use: Not Currently    Comment: socially   Drug use: No   Sexual activity: Not on file  Other Topics Concern   Not on file  Social History Narrative   Lives alone    Caffeine use: 2 cups daily   Social  Determinants of Health   Financial Resource Strain: Not on file  Food Insecurity: Not on file  Transportation Needs: Not on file  Physical Activity: Not on file  Stress: Not on file  Social Connections: Not on file  Intimate Partner Violence: Not on file    PHYSICAL EXAM  There were no vitals filed for this visit. There is no height or weight on file to calculate BMI.  Generalized: Well developed, in no acute distress  Neurological examination  Mentation: Alert oriented to time, place, history taking. Follows all commands speech and language fluent Cranial nerve II-XII: Pupils  were equal round reactive to light. Extraocular movements were full, visual field were full on confrontational test. Facial sensation and strength were normal. Uvula tongue midline. Head turning and shoulder shrug  were normal and symmetric. Motor: The motor testing reveals 5 over 5 strength of all 4 extremities. Good symmetric motor tone is noted throughout.  Sensory: Sensory testing is intact to soft touch on all 4 extremities. No evidence of extinction is noted.  Coordination: Cerebellar testing reveals good finger-nose-finger and heel-to-shin bilaterally.  Gait and station: Gait is normal. Tandem gait is normal. Romberg is negative. No drift is seen.  Reflexes: Deep tendon reflexes are symmetric and normal bilaterally.   DIAGNOSTIC DATA (LABS, IMAGING, TESTING) - I reviewed patient records, labs, notes, testing and imaging myself where available.  Lab Results  Component Value Date   WBC 6.1 02/11/2022   HGB 9.6 (L) 02/11/2022   HCT 32.5 (L) 02/11/2022   MCV 68.9 (L) 02/11/2022   PLT 349 02/11/2022      Component Value Date/Time   NA 138 01/27/2022 0948   K 4.0 01/27/2022 0948   CL 104 01/27/2022 0948   CO2 25 01/27/2022 0948   GLUCOSE 91 01/27/2022 0948   BUN 9 01/27/2022 0948   CREATININE 0.90 01/27/2022 0948   CALCIUM 8.8 01/27/2022 0948   PROT 7.2 01/27/2022 0948   ALBUMIN 4.5 11/28/2015  1730   AST 26 01/27/2022 0948   ALT 31 (H) 01/27/2022 0948   ALKPHOS 72 11/28/2015 1730   BILITOT 0.4 01/27/2022 0948   GFRNONAA 96 07/25/2019 1213   GFRAA 111 07/25/2019 1213   Lab Results  Component Value Date   CHOL 251 (H) 01/27/2022   HDL 40 (L) 01/27/2022   LDLCALC 175 (H) 01/27/2022   TRIG 196 (H) 01/27/2022   CHOLHDL 6.3 (H) 01/27/2022   No results found for: "HGBA1C" No results found for: "VITAMINB12" Lab Results  Component Value Date   TSH 4.71 (H) 03/03/2022    Butler Denmark, AGNP-C, DNP 05/15/2022, 5:22 AM [facility] [address] [place], [place] [address] [phone]

## 2022-06-10 ENCOUNTER — Other Ambulatory Visit: Payer: Self-pay | Admitting: Neurology

## 2022-07-03 ENCOUNTER — Encounter: Payer: Self-pay | Admitting: Neurology

## 2022-07-04 ENCOUNTER — Other Ambulatory Visit: Payer: Self-pay | Admitting: Neurology

## 2022-07-08 ENCOUNTER — Telehealth: Payer: Self-pay | Admitting: Neurology

## 2022-07-08 ENCOUNTER — Telehealth (INDEPENDENT_AMBULATORY_CARE_PROVIDER_SITE_OTHER): Payer: No Typology Code available for payment source | Admitting: Family Medicine

## 2022-07-08 ENCOUNTER — Encounter: Payer: Self-pay | Admitting: Family Medicine

## 2022-07-08 DIAGNOSIS — G43701 Chronic migraine without aura, not intractable, with status migrainosus: Secondary | ICD-10-CM

## 2022-07-08 DIAGNOSIS — I10 Essential (primary) hypertension: Secondary | ICD-10-CM

## 2022-07-08 MED ORDER — NARATRIPTAN HCL 2.5 MG PO TABS: ORAL_TABLET | ORAL | 11 refills | Status: DC

## 2022-07-08 MED ORDER — TIZANIDINE HCL 2 MG PO CAPS: ORAL_CAPSULE | ORAL | 0 refills | Status: DC

## 2022-07-08 MED ORDER — PROPRANOLOL HCL ER 60 MG PO CP24: 60.0000 mg | ORAL_CAPSULE | Freq: Every day | ORAL | 3 refills | Status: DC

## 2022-07-08 MED ORDER — AMLODIPINE BESYLATE 5 MG PO TABS: 5.0000 mg | ORAL_TABLET | Freq: Every day | ORAL | 3 refills | Status: DC

## 2022-07-08 MED ORDER — NORTRIPTYLINE HCL 50 MG PO CAPS: 50.0000 mg | ORAL_CAPSULE | Freq: Every day | ORAL | 3 refills | Status: DC

## 2022-07-08 NOTE — Telephone Encounter (Signed)
..   Pt understands that although there may be some limitations with this type of visit, we will take all precautions to reduce any security or privacy concerns.  Pt understands that this will be treated like an in office visit and we will file with pt's insurance, and there may be a patient responsible charge related to this service. ? ?

## 2022-07-08 NOTE — Telephone Encounter (Signed)
LVM and sent mychart msg informing pt of need to reschedule 07/08/22 appointment - MD out

## 2022-07-08 NOTE — Patient Instructions (Addendum)
Below is our plan:  We will continue amlodipine 5mg , nortriptyline 50mg  and propranolol 60mg  daily. Continue tizanidine, phenergan and naratriptan as needed. Please let me know if headaches worsen. We could consider increasing propranolol to 80mg  daily or trial of a different CGRP injection like Emgality. I have included information for you to review. Please message me if you have any questions.   Please make sure you are staying well hydrated. I recommend 50-60 ounces daily. Well balanced diet and regular exercise encouraged. Consistent sleep schedule with 6-8 hours recommended.   Please continue follow up with care team as directed.   Follow up with me in 1 year   You may receive a survey regarding today's visit. I encourage you to leave honest feed back as I do use this information to improve patient care. Thank you for seeing me today!

## 2022-07-08 NOTE — Progress Notes (Signed)
PATIENT: Cristina Zimmerman DOB: 1985-07-07  REASON FOR VISIT: follow up HISTORY FROM: patient  Virtual Visit via Telephone Note  I connected with Cristina Zimmerman on 07/08/22 at  3:00 PM EDT by telephone and verified that I am speaking with the correct person using two identifiers.   I discussed the limitations, risks, security and privacy concerns of performing an evaluation and management service by telephone and the availability of in person appointments. I also discussed with the patient that there may be a patient responsible charge related to this service. The patient expressed understanding and agreed to proceed.   History of Present Illness:  07/08/22 ALL: Cristina Zimmerman is a 37 y.o. female here today for follow up for migraines. She was last seen by Dr Jaynee Eagles 04/2021. She had one procedure of Botox 05/14/2021 but felt it was too expensive. She has continued amlodipine 5mg , nortriptyline 50mg  and propranolol LA 60mg  daily. She uses tizanidine 2mg , phenergan 12.5mg  and naratriptan 2.5mg  PRN. Since last visit, she reports doing well. She has reestablished care with PCP. She is being treated for anemia and hypothyroid. This has helped with migraine intensity. She is having about 4-8 migraine days a month. Abortive meds work well. She does endorse some anxiety that may be playing a role. She is planning to discuss with PCP.   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 She is also planning to see GI for eval for cause of anemia.   History (copied from Dr Cathren Laine previous note)  04/25/2021: She does not feel Ajovy is helping. She tried it and had GI side effects, her stomach didn't like it. She stopped the Ajovy a year ago and did not tell us(side effects), she says she has been under stress and very busy. On average she has been having at least 8 migraine days a month that are severe or moderate the last 6 months and recently even worsening to 15 migraine days a month the last 3 months,  pulsating/pounding/throbbing, nausea, photophobia/phonophobia, nausea, movement makes it worse, can be unilateral, propranolol helped a little bit but still with significant migraines, tizanidine helped, no medication overuse, no aura, discussed botox. Migraines can last 24-72 hours or longer. Failed multiple medications.    Medications tried for migraine include: Ajovy(side effects), topamax(depression), nortriptyline, amlodipine, propranolol, naratriptan, phenergan, tizanidine, nurtec, flexeril, lexapro, toradol, depo-medrol, reglan inj, amerge, zofran, prednisone, imitrex   Observations/Objective:  Generalized: Well developed, in no acute distress  Mentation: Alert oriented to time, place, history taking. Follows all commands speech and language fluent   Assessment and Plan:  37 y.o. year old female  has a past medical history of Allergy, Anxiety, Hematuria, Hip dysplasia, Hyperlipidemia, Hypertension, Migraine, NASH (nonalcoholic steatohepatitis), Osteoporosis, Scoliosis, Thyroid disease, Turner syndrome, and Vitamin D deficiency. here with    ICD-10-CM   1. Chronic migraine without aura with status migrainosus, not intractable  G43.701     2. Essential hypertension  I10 amLODipine (NORVASC) 5 MG tablet      [name] reports migraines do seem a little better following management of anemia and hypothyroid. We have discussed increasing propranolol versus restarting CGRP. We will continue current plan for now but she is aware to call with any worsening symptoms. I have encouraged her to continue close follow up with PCP. Discuss anxiety concerns with PCP and consider mood specialist if needed. Healthy lifestyle habits advised. She will follow up with me in 1 year, sooner if needed.    No orders of the defined  types were placed in this encounter.   Meds ordered this encounter  Medications   propranolol ER (INDERAL LA) 60 MG 24 hr capsule    Sig: Take 1 capsule (60 mg total) by mouth  daily.    Dispense:  90 capsule    Refill:  3    Order Specific Question:   Supervising Provider    Answer:   Melvenia Beam [id number]   naratriptan (AMERGE) 2.5 MG tablet    Sig: TAKE 1 TABLET AS NEEDED MIGRAINE MAY REPEAT AFTER 2 HOURS MAX 2 PER 24 HOURS.    Dispense:  12 tablet    Refill:  11    #12 per 30    Order Specific Question:   Supervising Provider    Answer:   Melvenia Beam [id number]   nortriptyline (PAMELOR) 50 MG capsule    Sig: Take 1 capsule (50 mg total) by mouth at bedtime.    Dispense:  90 capsule    Refill:  3    PT REQ REFILLS    Order Specific Question:   Supervising Provider    Answer:   Melvenia Beam [id number]   tizanidine (ZANAFLEX) 2 MG capsule    Sig: TAKE 1 CAPSULE BY MOUTH 3 TIMES DAILY AS NEEDED FOR MUSCLE SPASMS.    Dispense:  60 capsule    Refill:  0    PT REQ REFILLS    Order Specific Question:   Supervising Provider    Answer:   Melvenia Beam [id number]   amLODipine (NORVASC) 5 MG tablet    Sig: Take 1 tablet (5 mg total) by mouth daily.    Dispense:  90 tablet    Refill:  3    Order Specific Question:   Supervising Provider    Answer:   Melvenia Beam [id number]     Follow Up Instructions:  I discussed the assessment and treatment plan with the patient. The patient was provided an opportunity to ask questions and all were answered. The patient agreed with the plan and demonstrated an understanding of the instructions.   The patient was advised to call back or seek an in-person evaluation if the symptoms worsen or if the condition fails to improve as anticipated.  I provided 25 minutes of non-face-to-face time during this encounter. Patient located at their place of residence during [place] visit. Provider is in the office.    Debbora Presto, NP

## 2022-07-09 ENCOUNTER — Ambulatory Visit: Payer: No Typology Code available for payment source | Admitting: Neurology

## 2022-07-21 ENCOUNTER — Telehealth: Payer: Self-pay | Admitting: General Practice

## 2022-07-21 DIAGNOSIS — Z0289 Encounter for other administrative examinations: Secondary | ICD-10-CM

## 2022-07-21 NOTE — Transitions of Care (Post Inpatient/ED Visit) (Signed)
   07/21/2022  Name: Cristina Zimmerman MRN: [id number] DOB: [birth year]  Today's TOC FU Call Status: Today's TOC FU Call Status:: Unsuccessul Call (1st Attempt) Unsuccessful Call (1st Attempt) Date: 07/21/22  Attempted to reach the patient regarding the most recent Inpatient/ED visit.  Follow Up Plan: Additional outreach attempts will be made to reach the patient to complete the Transitions of Care (Post Inpatient/ED visit) call.   Signature Tinnie Gens, RN BSN

## 2022-07-21 NOTE — Telephone Encounter (Signed)
Gave completed/signed form back to medical records to process for pt. 

## 2022-07-22 ENCOUNTER — Telehealth: Payer: Self-pay | Admitting: *Deleted

## 2022-07-22 NOTE — Transitions of Care (Post Inpatient/ED Visit) (Signed)
   07/22/2022  Name: Cristina Zimmerman MRN: [id number] DOB: July 03, 1985  Today's TOC FU Call Status: Today's TOC FU Call Status:: Unsuccessful Call (2nd Attempt) Unsuccessful Call (1st Attempt) Date: 07/21/22 Unsuccessful Call (2nd Attempt) Date: 07/22/22  Attempted to reach the patient regarding the most recent Inpatient/ED visit.  Follow Up Plan: Additional outreach attempts will be made to reach the patient to complete the Transitions of Care (Post Inpatient/ED visit) call.   Signature Tinnie Gens, RN BSN

## 2022-07-22 NOTE — Telephone Encounter (Signed)
Pt FMLA form faxed on 07/21/2022 to [phone]

## 2022-07-23 ENCOUNTER — Ambulatory Visit: Payer: No Typology Code available for payment source | Admitting: Neurology

## 2022-07-23 NOTE — Transitions of Care (Post Inpatient/ED Visit) (Signed)
   07/23/2022  Name: Cristina Zimmerman MRN: [id number] DOB: 21-Feb-1986  Today's TOC FU Call Status: Today's TOC FU Call Status:: Unsuccessful Call (3rd Attempt) Unsuccessful Call (1st Attempt) Date: 07/21/22 Unsuccessful Call (2nd Attempt) Date: 07/22/22 Unsuccessful Call (3rd Attempt) Date: 07/23/22  Attempted to reach the patient regarding the most recent Inpatient/ED visit.  Follow Up Plan: No further outreach attempts will be made at this time. We have been unable to contact the patient.  Signature Tinnie Gens, RN BSN

## 2022-07-28 ENCOUNTER — Ambulatory Visit
Admission: RE | Admit: 2022-07-28 | Discharge: 2022-07-28 | Disposition: A | Discharge status: Home / Self Care | Payer: No Typology Code available for payment source | Source: Ambulatory Visit | Attending: Family Medicine | Admitting: Family Medicine

## 2022-07-28 VITALS — BP 113/79 | HR 99 | Temp 98.6°F | Resp 17

## 2022-07-28 DIAGNOSIS — H66004 Acute suppurative otitis media without spontaneous rupture of ear drum, recurrent, right ear: Secondary | ICD-10-CM

## 2022-07-28 DIAGNOSIS — J069 Acute upper respiratory infection, unspecified: Secondary | ICD-10-CM | POA: Diagnosis not present

## 2022-07-28 DIAGNOSIS — J4540 Moderate persistent asthma, uncomplicated: Secondary | ICD-10-CM

## 2022-07-28 MED ORDER — CEFDINIR 300 MG PO CAPS: 300.0000 mg | ORAL_CAPSULE | Freq: Two times a day (BID) | ORAL | 0 refills | Status: DC

## 2022-07-28 NOTE — Discharge Instructions (Signed)
Use Flonase for the nasal congestion and to help the ears drain Drink lots of fluids Take cefdinir 2 times a day for 10 days See your after if not improving by next week

## 2022-07-28 NOTE — ED Triage Notes (Signed)
Pt c/o sore throat and RT ear pain x 3 days. Lots of post nasal drainage. Worsening sxs in last 24 hours. Denies fever. Mucinex and motrin prn.

## 2022-07-28 NOTE — ED Provider Notes (Addendum)
[facility]    CSN: [id number] Arrival date & time: 07/28/22  1648      History   Chief Complaint Chief Complaint  Patient presents with   Sore Throat   Otalgia    RT    HPI Cristina Zimmerman is a 37 y.o. female.   HPI  Patient had cold symptoms for several days with postnasal drip.  Since yesterday she has had sore throat and right ear pain.  This morning her right ear pain was worse.  Hearing is normal.  She does have a history of recurring ear infections and several ear surgeries  Past Medical History:  Diagnosis Date   Allergy    Anxiety    Hematuria    Hip dysplasia    Hyperlipidemia    Hypertension    Migraine    NASH (nonalcoholic steatohepatitis)    Osteoporosis    Scoliosis    minimal   Thyroid disease    Turner syndrome    Vitamin D deficiency     Patient Active Problem List   Diagnosis Date Noted   Myalgia 03/03/2022   Right ear pain 03/03/2022   Depression 06/14/2018   Vertebral compression fracture, initial encounter 08/22/2016   Chronic migraine without aura with status migrainosus, not intractable 12/03/2015   Migraine with aura and with status migrainosus 12/03/2015   Migraine with aura and without status migrainosus, not intractable 11/22/2015   Hypothyroidism 07/22/2012   Childhood asthma 07/20/2012   Bronchospasm 07/20/2012   Allergy    Anxiety    Turner syndrome    Hip dysplasia    Vitamin D deficiency    Hematuria    Familial hyperlipidemia, high LDL    Hypertension    Osteoporosis    NASH (nonalcoholic steatohepatitis)    Scoliosis     Past Surgical History:  Procedure Laterality Date   BILATERAL OOPHORECTOMY     HERNIA REPAIR     x2   HIP SURGERY     KYPHOPLASTY  08/2016   L2   TONSILLECTOMY     TYMPANOSTOMY TUBE PLACEMENT      OB History   No obstetric history on file.      Home Medications    Prior to Admission medications   Medication Sig Start Date End Date Taking? Authorizing Provider   cefdinir (OMNICEF) 300 MG capsule Take 1 capsule (300 mg total) by mouth 2 (two) times daily. 07/28/22  Yes Eustace Moore, MD  levothyroxine (SYNTHROID) 125 MCG tablet Take 1 tablet (125 mcg total) by mouth daily. 03/04/22   Charlton Amor, DO  albuterol (PROVENTIL HFA;VENTOLIN HFA) 108 (90 Base) MCG/ACT inhaler Inhale 2 puffs into the lungs every 6 (six) hours as needed for wheezing. 12/30/16   Anson Fret, MD  amLODipine (NORVASC) 5 MG tablet Take 1 tablet (5 mg total) by mouth daily. 07/08/22   Lomax, Amy, NP  Cholecalciferol 20 MCG (800 UNIT) TABS Take 1 tablet by mouth daily. 01/28/22   Charlton Amor, DO  estradiol (CLIMARA - DOSED IN MG/24 HR) 0.1 mg/24hr patch PLACE 1 PATCH (0.1 MG TOTAL) ONTO THE SKIN ONCE A WEEK. 04/03/22   Everrett Coombe, DO  Fe Bisgly-Vit C-Vit B12-FA (GENTLE IRON) 28-60-0.008-0.4 MG CAPS Take 1 capsule by mouth in the morning. 02/11/22   Rushie Chestnut, PA-C  fexofenadine (ALLEGRA) 60 MG tablet Take 60 mg by mouth daily.      [provider]  Fluticasone-Salmeterol (ADVAIR DISKUS) 100-50 MCG/DOSE AEPB Inhale 1  puff into the lungs 2 (two) times daily. Inhale 1 puff into lungs bid 12/30/16   Anson FretAhern, Antonia B, MD  medroxyPROGESTERone (PROVERA) 10 MG tablet TAKE 1 TABLET BY MOUTH EVERY DAY FOR 10 DAYS PER MONTH 04/05/20   Sunnie NielsenAlexander, Natalie, DO  Multiple Vitamin (MULTIVITAMIN) tablet Take 1 tablet by mouth daily.      [provider]  naratriptan (AMERGE) 2.5 MG tablet TAKE 1 TABLET AS NEEDED MIGRAINE MAY REPEAT AFTER 2 HOURS MAX 2 PER 24 HOURS. 07/08/22   Lomax, Amy, NP  nortriptyline (PAMELOR) 50 MG capsule Take 1 capsule (50 mg total) by mouth at bedtime. 07/08/22   Lomax, Amy, NP  omeprazole (PRILOSEC) 40 MG capsule TAKE 1 CAPSULE BY MOUTH EVERY DAY 03/01/20   Sunnie NielsenAlexander, Natalie, DO  ondansetron (ZOFRAN-ODT) 8 MG disintegrating tablet Take 1 tablet (8 mg total) by mouth every 8 (eight) hours as needed (prior to meals to rpevent diarrhea).  03/10/18   Sunnie NielsenAlexander, Natalie, DO  promethazine (PHENERGAN) 12.5 MG tablet Take 1 tablet (12.5 mg total) by mouth every 6 (six) hours as needed for nausea or vomiting. 04/25/21   Anson FretAhern, Antonia B, MD  propranolol ER (INDERAL LA) 60 MG 24 hr capsule Take 1 capsule (60 mg total) by mouth daily. 07/08/22   Lomax, Amy, NP    Family History Family History  Problem Relation Age of Onset   Hypertension Mother    Arthritis Mother    Fibromyalgia Mother    Hyperlipidemia Mother    Diabetes Father    Diabetes Paternal Grandmother    Stroke Paternal Grandmother    Kidney disease Paternal Grandmother    Heart attack Paternal Grandmother    Hypertension Paternal Grandmother    Hyperlipidemia Paternal Grandmother    Diabetes Paternal Grandfather    Alcohol abuse Maternal Grandfather    Cancer Maternal Grandfather        BRAIN    Social History Social History   Tobacco Use   Smoking status: Never   Smokeless tobacco: Never  Vaping Use   Vaping Use: Never used  Substance Use Topics   Alcohol use: Not Currently    Comment: socially   Drug use: No     Allergies   Tape and Prednisone   Review of Systems Review of Systems  See HPI Physical Exam Triage Vital Signs ED Triage Vitals [07/28/22 1701]  Enc Vitals Group     BP 113/79     Pulse Rate 99     Resp 17     Temp 98.6 F (37 C)     Temp Source Oral     SpO2 100 %     Weight      Height      Head Circumference      Peak Flow      Pain Score 5     Pain Loc      Pain Edu?      Excl. in GC?    No data found.  Updated Vital Signs BP 113/79 (BP Location: Right Arm)   Pulse 99   Temp 98.6 F (37 C) (Oral)   Resp 17   LMP  (LMP Unknown)   SpO2 100%      Physical Exam Constitutional:      General: She is not in acute distress.    Appearance: She is well-developed. She is obese.  HENT:     Head: Normocephalic and atraumatic.     Right Ear: Ear canal normal. A middle ear  effusion is present. Tympanic membrane is  erythematous.     Left Ear: Tympanic membrane and ear canal normal.     Nose: Congestion and rhinorrhea present.     Mouth/Throat:     Mouth: Mucous membranes are moist. No oral lesions.     Pharynx: Uvula midline. Posterior oropharyngeal erythema present. No oropharyngeal exudate.     Tonsils: No tonsillar exudate. 0 on the right. 0 on the left.     Comments: Tonsils are surgically absent.  There is posterior pharyngeal erythema and hyperplasia from the postnasal drip.  Clear drainage visible Eyes:     Conjunctiva/sclera: Conjunctivae normal.     Pupils: Pupils are equal, round, and reactive to light.  Cardiovascular:     Rate and Rhythm: Normal rate and regular rhythm.  Pulmonary:     Effort: Pulmonary effort is normal. No respiratory distress.     Breath sounds: Normal breath sounds. No wheezing.  Abdominal:     General: There is no distension.     Palpations: Abdomen is soft.  Musculoskeletal:        General: Normal range of motion.     Cervical back: Normal range of motion.  Lymphadenopathy:     Cervical: No cervical adenopathy.  Skin:    General: Skin is warm and dry.  Neurological:     Mental Status: She is alert.      UC Treatments / Results  Labs (all labs ordered are listed, but only abnormal results are displayed) Labs Reviewed - No data to display  EKG   Radiology No results found.  Procedures Procedures (including critical care time)  Medications Ordered in UC Medications - No data to display  Initial Impression / Assessment and Plan / UC Course  I have reviewed the triage vital signs and the nursing notes.  Pertinent labs & imaging results that were available during my care of the patient were reviewed by me and considered in my medical decision making (see chart for details).    Patient states the Augmentin I prescribed for her last visit did not work well. Final Clinical Impressions(s) / UC Diagnoses   Final diagnoses:  Recurrent acute  suppurative otitis media of right ear without spontaneous rupture of tympanic membrane  Viral upper respiratory tract infection  Moderate persistent asthma, unspecified whether complicated     Discharge Instructions      Use Flonase for the nasal congestion and to help the ears drain Drink lots of fluids Take cefdinir 2 times a day for 10 days See your after if not improving by next week    ED Prescriptions     Medication Sig Dispense Auth. Provider   cefdinir (OMNICEF) 300 MG capsule Take 1 capsule (300 mg total) by mouth 2 (two) times daily. 20 capsule Eustace Moore, MD      PDMP not reviewed this encounter.   Eustace Moore, MD 07/28/22 Evette Doffing    Eustace Moore, MD 07/28/22 [phone]

## 2022-08-15 ENCOUNTER — Encounter (INDEPENDENT_AMBULATORY_CARE_PROVIDER_SITE_OTHER): Payer: No Typology Code available for payment source | Admitting: Family Medicine

## 2022-08-15 DIAGNOSIS — N921 Excessive and frequent menstruation with irregular cycle: Secondary | ICD-10-CM

## 2022-08-18 NOTE — Telephone Encounter (Signed)
  Please see the MyChart message reply(ies) for my assessment and plan.    This patient gave consent for this Medical Advice Message and is aware that it may result in a bill to [employer], as well as the possibility of receiving a bill for a co-payment or deductible. They are an established patient, but are not seeking medical advice exclusively about a problem treated during an in person or video visit in the last seven days. I did not recommend an in person or video visit within seven days of my reply.    I spent a total of 5 minutes cumulative time within 7 days through [employer].  Reviewed pt chart and have sent referral for gynecology.  Charlton Amor, DO

## 2022-09-04 ENCOUNTER — Other Ambulatory Visit: Payer: Self-pay | Admitting: Family Medicine

## 2022-09-04 DIAGNOSIS — Q969 Turner's syndrome, unspecified: Secondary | ICD-10-CM

## 2022-09-05 ENCOUNTER — Other Ambulatory Visit: Payer: Self-pay | Admitting: Family Medicine

## 2022-09-22 ENCOUNTER — Encounter: Payer: Self-pay | Admitting: Obstetrics and Gynecology

## 2022-09-22 ENCOUNTER — Other Ambulatory Visit (HOSPITAL_COMMUNITY)
Admission: RE | Admit: 2022-09-22 | Discharge: 2022-09-22 | Disposition: A | Discharge status: Home / Self Care | Payer: No Typology Code available for payment source | Source: Ambulatory Visit | Attending: Obstetrics and Gynecology | Admitting: Obstetrics and Gynecology

## 2022-09-22 ENCOUNTER — Ambulatory Visit: Payer: No Typology Code available for payment source | Admitting: Obstetrics and Gynecology

## 2022-09-22 VITALS — BP 122/84 | HR 83 | Ht 62.5 in | Wt 174.0 lb

## 2022-09-22 DIAGNOSIS — Q969 Turner's syndrome, unspecified: Secondary | ICD-10-CM | POA: Diagnosis not present

## 2022-09-22 DIAGNOSIS — Z01419 Encounter for gynecological examination (general) (routine) without abnormal findings: Secondary | ICD-10-CM | POA: Insufficient documentation

## 2022-09-22 DIAGNOSIS — N939 Abnormal uterine and vaginal bleeding, unspecified: Secondary | ICD-10-CM

## 2022-09-22 MED ORDER — ESTRADIOL 0.075 MG/24HR TD PTTW: 1.0000 | MEDICATED_PATCH | TRANSDERMAL | 12 refills | Status: DC

## 2022-09-22 NOTE — Progress Notes (Signed)
Subjective:     Cristina Zimmerman is a 37 y.o. female P0 with LMP 3/24 and BMI 31 who is here for a routine exam.  Current complaints: abnormal uterine bleeding for 5 weeks.  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 She has been on estrogen/progesterone replacement therapy for over a year at the current dosing. She is being followed by PCP and endocrinologist. She reports a 5 week episode of heavy vaginal bleeding with passage of clots. She denies chest pain, shortness of breath, lightheadedness/dizziness. She admits to being fatigued. This was the first time she had such episode of vaginal bleeding. She was seen at urgent care [facility] and was noted to have a normal ultrasound with a normal endometrial stripe and no evidence of fibroid or endometrial polyp. Patient is not sexually active and is undecided regarding future fertility plans. Patient is without any other complaints    Obstetric History OB History  No obstetric history on file.        Review of Systems Pertinent items noted in HPI and remainder of comprehensive ROS otherwise negative.    Objective:  Blood pressure 122/84, pulse 83, height 5' 2.5" (1.588 m), weight 174 lb (78.9 kg).   GENERAL: Well-developed, well-nourished female in no acute distress.  HEENT: Normocephalic, atraumatic. Sclerae anicteric.  NECK: Supple. Normal thyroid.  LUNGS: Clear to auscultation bilaterally.  HEART: Regular rate and rhythm. BREASTS: Symmetric in size. No palpable masses or lymphadenopathy, skin changes, or nipple drainage. ABDOMEN: Soft, nontender, nondistended. No organomegaly. PELVIC: Normal external female genitalia. Vagina is pink and rugated.  Normal discharge. Normal appearing cervix. Uterus is normal in size. No adnexal mass or tenderness. Chaperone present during the pelvic exam EXTREMITIES: No cyanosis, clubbing, or edema, 2+ distal pulses.     Assessment:    Healthy female exam.    Plan:     37 yo here for annual exam and management of AUB - pap smear collected - CBC today to rule out anemia - patient declined STI screening - Patient will be contacted with abnormal results - Discussed continued monitoring of menstrual cycle at current dosing of climara. If no further episodes of abnormal vaginal bleeding no need to change medication. If however, abnormal uterine bleeding persists, patient was provided new RX of 0.075 weekly patch to try until next endocrinology appointment. - patient verbalized understanding and all questions were answered

## 2022-09-23 ENCOUNTER — Other Ambulatory Visit: Payer: Self-pay

## 2022-09-23 DIAGNOSIS — Z01419 Encounter for gynecological examination (general) (routine) without abnormal findings: Secondary | ICD-10-CM

## 2022-09-23 DIAGNOSIS — N939 Abnormal uterine and vaginal bleeding, unspecified: Secondary | ICD-10-CM

## 2022-09-23 LAB — CBC
Hematocrit: 36.7 % (ref 34.0–46.6)
Hemoglobin: 11.2 g/dL (ref 11.1–15.9)
MCH: 24.6 pg — ABNORMAL LOW (ref 26.6–33.0)
MCHC: 30.5 g/dL — ABNORMAL LOW (ref 31.5–35.7)
MCV: 81 fL (ref 79–97)
Platelets: 331 10*3/uL (ref 150–450)
RBC: 4.56 x10E6/uL (ref 3.77–5.28)
RDW: 14.6 % (ref 11.7–15.4)
WBC: 6.6 10*3/uL (ref 3.4–10.8)

## 2022-09-23 MED ORDER — ESTRADIOL 0.075 MG/24HR TD PTTW
1.0000 | MEDICATED_PATCH | TRANSDERMAL | 12 refills | Status: DC
Start: 2022-09-23 — End: 2023-12-30

## 2022-09-23 NOTE — Progress Notes (Signed)
Rx corrected to qty 4 with 12 refills per pharamacy

## 2022-09-26 LAB — CYTOLOGY - PAP
Comment: NEGATIVE
Diagnosis: NEGATIVE
High risk HPV: NEGATIVE

## 2022-10-09 ENCOUNTER — Other Ambulatory Visit: Payer: Self-pay | Admitting: Family Medicine

## 2022-10-28 ENCOUNTER — Ambulatory Visit
Admission: RE | Admit: 2022-10-28 | Discharge: 2022-10-28 | Disposition: A | Discharge status: Home / Self Care | Payer: No Typology Code available for payment source | Source: Ambulatory Visit | Attending: Family Medicine | Admitting: Family Medicine

## 2022-10-28 ENCOUNTER — Other Ambulatory Visit: Payer: Self-pay

## 2022-10-28 VITALS — BP 104/72 | HR 87 | Temp 98.2°F | Resp 16 | Ht 62.0 in | Wt 175.0 lb

## 2022-10-28 DIAGNOSIS — J309 Allergic rhinitis, unspecified: Secondary | ICD-10-CM

## 2022-10-28 DIAGNOSIS — J0100 Acute maxillary sinusitis, unspecified: Secondary | ICD-10-CM | POA: Diagnosis not present

## 2022-10-28 DIAGNOSIS — R0981 Nasal congestion: Secondary | ICD-10-CM

## 2022-10-28 MED ORDER — FEXOFENADINE HCL 180 MG PO TABS: 180.0000 mg | ORAL_TABLET | Freq: Every day | ORAL | 0 refills | Status: DC

## 2022-10-28 MED ORDER — AMOXICILLIN-POT CLAVULANATE 875-125 MG PO TABS: 1.0000 | ORAL_TABLET | Freq: Two times a day (BID) | ORAL | 0 refills | Status: AC

## 2022-10-28 NOTE — ED Triage Notes (Signed)
Patient c/o 1 week of sinus congestion and drainage. For the past 3 days ear fullness and and pain, mostly in the right ear.

## 2022-10-28 NOTE — Discharge Instructions (Addendum)
Advised patient to take medications as directed with food to completion.  Advised patient to take Allegra with first dose of Augmentin for the next 5 of 10 days.  Advised may use Allegra as needed afterwards for concurrent postnasal drainage/drip.  Encouraged increase daily water intake to 64 ounces per day while taking these medications.  Advised if symptoms worsen and/or unresolved please follow-up PCP or here for further evaluation.

## 2022-10-28 NOTE — ED Provider Notes (Signed)
[facility]    CSN: [id number] Arrival date & time: 10/28/22  1012      History   Chief Complaint Chief Complaint  Patient presents with   Ear Fullness    Ear pain, sinus drainage - Entered by patient   Sinus Problem    HPI Witten Register is a 37 y.o. female.   HPI Pleasant 37 year old female presents with sinus nasal congestion with drainage for 1 week.  Patient reports right ear pain and fullness for the past 3 days.  PMH significant for obesity, thyroid disease, and HTN.  Past Medical History:  Diagnosis Date   Allergy    Anxiety    Hematuria    Hip dysplasia    Hyperlipidemia    Hypertension    Migraine    NASH (nonalcoholic steatohepatitis)    Osteoporosis    Scoliosis    minimal   Thyroid disease    Turner syndrome    Vitamin D deficiency     Patient Active Problem List   Diagnosis Date Noted   Myalgia 03/03/2022   Right ear pain 03/03/2022   Depression 06/14/2018   Vertebral compression fracture, initial encounter 08/22/2016   Chronic migraine without aura with status migrainosus, not intractable 12/03/2015   Migraine with aura and with status migrainosus 12/03/2015   Migraine with aura and without status migrainosus, not intractable 11/22/2015   Hypothyroidism 07/22/2012   Childhood asthma 07/20/2012   Bronchospasm 07/20/2012   Allergy    Anxiety    Turner syndrome    Hip dysplasia    Vitamin D deficiency    Hematuria    Familial hyperlipidemia, high LDL    Hypertension    Osteoporosis    NASH (nonalcoholic steatohepatitis)    Scoliosis     Past Surgical History:  Procedure Laterality Date   BILATERAL OOPHORECTOMY     HERNIA REPAIR     x2   HIP SURGERY     KYPHOPLASTY  08/2016   L2   TONSILLECTOMY     TYMPANOSTOMY TUBE PLACEMENT      OB History   No obstetric history on file.      Home Medications    Prior to Admission medications   Medication Sig Start Date End Date Taking? Authorizing Provider   amoxicillin-clavulanate (AUGMENTIN) 875-125 MG tablet Take 1 tablet by mouth 2 (two) times daily for 10 days. 10/28/22 11/07/22 Yes Trevor Iha, FNP  fexofenadine [facility] ALLERGY) 180 MG tablet Take 1 tablet (180 mg total) by mouth daily for 15 days. 10/28/22 11/12/22 Yes Trevor Iha, FNP  albuterol (PROVENTIL HFA;VENTOLIN HFA) 108 (90 Base) MCG/ACT inhaler Inhale 2 puffs into the lungs every 6 (six) hours as needed for wheezing. 12/30/16   Anson Fret, MD  amLODipine (NORVASC) 5 MG tablet Take 1 tablet (5 mg total) by mouth daily. 07/08/22   Lomax, Amy, NP  Cholecalciferol 20 MCG (800 UNIT) TABS Take 1 tablet by mouth daily. 01/28/22   Charlton Amor, DO  estradiol (CLIMARA - DOSED IN MG/24 HR) 0.1 mg/24hr patch PLACE 1 PATCH (0.1 MG TOTAL) ONTO THE SKIN ONCE A WEEK. 09/05/22   Morey Hummingbird S, DO  estradiol (VIVELLE-DOT) 0.075 MG/24HR Place 1 patch onto the skin once a week. 09/23/22   Constant, Peggy, MD  Fe Bisgly-Vit C-Vit B12-FA (GENTLE IRON) 28-60-0.008-0.4 MG CAPS Take 1 capsule by mouth in the morning. 02/11/22   Rushie Chestnut, PA-C  Fluticasone-Salmeterol (ADVAIR DISKUS) 100-50 MCG/DOSE AEPB Inhale 1 puff into the lungs  2 (two) times daily. Inhale 1 puff into lungs bid 12/30/16   Anson Fret, MD  levothyroxine (SYNTHROID) 125 MCG tablet TAKE 1 TABLET (125 MCG TOTAL) BY MOUTH DAILY. PT NEEDS APT FOR ADDITIONAL REFILLS 10/09/22   Charlton Amor, DO  medroxyPROGESTERone (PROVERA) 10 MG tablet TAKE 1 TABLET BY MOUTH EVERY DAY FOR 10 DAYS PER MONTH 04/05/20   Sunnie Nielsen, DO  Multiple Vitamin (MULTIVITAMIN) tablet Take 1 tablet by mouth daily.      [provider]  naratriptan (AMERGE) 2.5 MG tablet TAKE 1 TABLET AS NEEDED MIGRAINE MAY REPEAT AFTER 2 HOURS MAX 2 PER 24 HOURS. 07/08/22   Lomax, Amy, NP  nortriptyline (PAMELOR) 50 MG capsule Take 1 capsule (50 mg total) by mouth at bedtime. 07/08/22   Lomax, Amy, NP  omeprazole (PRILOSEC) 40 MG capsule TAKE 1 CAPSULE BY MOUTH  EVERY DAY 03/01/20   Sunnie Nielsen, DO  ondansetron (ZOFRAN-ODT) 8 MG disintegrating tablet Take 1 tablet (8 mg total) by mouth every 8 (eight) hours as needed (prior to meals to rpevent diarrhea). 03/10/18   Sunnie Nielsen, DO  promethazine (PHENERGAN) 12.5 MG tablet Take 1 tablet (12.5 mg total) by mouth every 6 (six) hours as needed for nausea or vomiting. 04/25/21   Anson Fret, MD  propranolol ER (INDERAL LA) 60 MG 24 hr capsule Take 1 capsule (60 mg total) by mouth daily. 07/08/22   Shawnie Dapper, NP    Family History Family History  Problem Relation Age of Onset   Hypertension Mother    Arthritis Mother    Fibromyalgia Mother    Hyperlipidemia Mother    Diabetes Father    Diabetes Paternal Grandmother    Stroke Paternal Grandmother    Kidney disease Paternal Grandmother    Heart attack Paternal Grandmother    Hypertension Paternal Grandmother    Hyperlipidemia Paternal Grandmother    Diabetes Paternal Grandfather    Alcohol abuse Maternal Grandfather    Cancer Maternal Grandfather        BRAIN    Social History Social History   Tobacco Use   Smoking status: Never   Smokeless tobacco: Never  Vaping Use   Vaping Use: Never used  Substance Use Topics   Alcohol use: Not Currently    Comment: socially   Drug use: No     Allergies   Tape and Prednisone   Review of Systems Review of Systems  HENT:  Positive for congestion, postnasal drip and sinus pressure.   All other systems reviewed and are negative.    Physical Exam Triage Vital Signs ED Triage Vitals  Enc Vitals Group     BP 10/28/22 1025 104/72     Pulse Rate 10/28/22 1025 87     Resp 10/28/22 1025 16     Temp 10/28/22 1025 98.2 F (36.8 C)     Temp Source 10/28/22 1025 Oral     SpO2 10/28/22 1025 98 %     Weight 10/28/22 1022 175 lb (79.4 kg)     Height 10/28/22 1022 5\' 2"  (1.575 m)     Head Circumference --      Peak Flow --      Pain Score 10/28/22 1022 4     Pain Loc --      Pain  Edu? --      Excl. in GC? --    No data found.  Updated Vital Signs BP 104/72 (BP Location: Right Arm)   Pulse 87  Temp 98.2 F (36.8 C) (Oral)   Resp 16   Ht 5\' 2"  (1.575 m)   Wt 175 lb (79.4 kg)   SpO2 98%   BMI 32.01 kg/m      Physical Exam Vitals and nursing note reviewed.  Constitutional:      General: She is not in acute distress.    Appearance: Normal appearance. She is obese. She is not ill-appearing, toxic-appearing or diaphoretic.  HENT:     Head: Normocephalic and atraumatic.     Right Ear: External ear normal.     Left Ear: Tympanic membrane and external ear normal.     Ears:     Comments: Right TM: clear, retracted, scarring from old perforation noted; significant eustachian tube dysfunction noted bilaterally    Nose:     Comments: Turbinates are erythematous/edematous    Mouth/Throat:     Mouth: Mucous membranes are moist.     Pharynx: Oropharynx is clear.     Comments: Moderate to significant amount of clear drainage of posterior oropharynx noted Eyes:     Extraocular Movements: Extraocular movements intact.     Conjunctiva/sclera: Conjunctivae normal.     Pupils: Pupils are equal, round, and reactive to light.  Cardiovascular:     Rate and Rhythm: Normal rate and regular rhythm.     Pulses: Normal pulses.     Heart sounds: Normal heart sounds.  Pulmonary:     Effort: Pulmonary effort is normal.     Breath sounds: Normal breath sounds. No wheezing, rhonchi or rales.  Musculoskeletal:        General: Normal range of motion.     Cervical back: Normal range of motion and neck supple. No tenderness.  Lymphadenopathy:     Cervical: No cervical adenopathy.  Skin:    General: Skin is warm and dry.  Neurological:     General: No focal deficit present.     Mental Status: She is alert and oriented to person, place, and time. Mental status is at baseline.  Psychiatric:        Mood and Affect: Mood normal.        Behavior: Behavior normal.         Thought Content: Thought content normal.      UC Treatments / Results  Labs (all labs ordered are listed, but only abnormal results are displayed) Labs Reviewed - No data to display  EKG   Radiology No results found.  Procedures Procedures (including critical care time)  Medications Ordered in UC Medications - No data to display  Initial Impression / Assessment and Plan / UC Course  I have reviewed the triage vital signs and the nursing notes.  Pertinent labs & imaging results that were available during my care of the patient were reviewed by me and considered in my medical decision making (see chart for details).     MDM: 1.  Acute maxillary sinusitis, recurrence not specified-Rx'd Augmentin 875/125 mg tablet twice daily x 10 days; 2.  Allergic rhinitis, unspecified seasonality, unspecified trigger-Rx'd Allegra 180 mg fexofenadine daily x 5 days; 3.  Congestion of nasal sinus-same as 2. Rx'd Allegra 180 mg fexofenadine daily x 5 days. Advised patient to take medications as directed with food to completion.  Advised patient to take Allegra with first dose of Augmentin for the next 5 of 10 days.  Advised may use Allegra as needed afterwards for concurrent postnasal drainage/drip.  Encouraged increase daily water intake to 64 ounces per day while taking  these medications.  Advised if symptoms worsen and/or unresolved please follow-up PCP or here for further evaluation.  Work note provided to patient prior to discharge.  Patient discharged home, hemodynamically stable.   Final Clinical Impressions(s) / UC Diagnoses   Final diagnoses:  Acute maxillary sinusitis, recurrence not specified  Allergic rhinitis, unspecified seasonality, unspecified trigger  Congestion of nasal sinus     Discharge Instructions      Advised patient to take medications as directed with food to completion.  Advised patient to take Allegra with first dose of Augmentin for the next 5 of 10 days.  Advised  may use Allegra as needed afterwards for concurrent postnasal drainage/drip.  Encouraged increase daily water intake to 64 ounces per day while taking these medications.  Advised if symptoms worsen and/or unresolved please follow-up PCP or here for further evaluation.     ED Prescriptions     Medication Sig Dispense Auth. Provider   amoxicillin-clavulanate (AUGMENTIN) 875-125 MG tablet Take 1 tablet by mouth 2 (two) times daily for 10 days. 20 tablet Trevor Iha, FNP   fexofenadine [facility] ALLERGY) 180 MG tablet Take 1 tablet (180 mg total) by mouth daily for 15 days. 15 tablet Trevor Iha, FNP      PDMP not reviewed this encounter.   Trevor Iha, FNP 10/28/22 1106

## 2022-11-30 ENCOUNTER — Encounter: Payer: Self-pay | Admitting: Family Medicine

## 2022-12-10 ENCOUNTER — Telehealth: Payer: Self-pay

## 2022-12-10 NOTE — Telephone Encounter (Signed)
Faxed form to [phone] on 12/10/2022

## 2023-02-25 ENCOUNTER — Encounter: Payer: Self-pay | Admitting: Family Medicine

## 2023-02-26 MED ORDER — TIZANIDINE HCL 2 MG PO CAPS: 2.0000 mg | ORAL_CAPSULE | Freq: Three times a day (TID) | ORAL | 1 refills | Status: DC

## 2023-02-26 NOTE — Telephone Encounter (Signed)
Last seen on 07/08/22 per note " She uses tizanidine 2mg , phenergan 12.5mg  and naratriptan 2.5mg  PRN " No follow up scheduled   Medication is not currently listed on med list, however I did see where Amy prescribed medication in past under medication list. Rx sent

## 2023-06-15 ENCOUNTER — Encounter: Payer: Self-pay | Admitting: Family Medicine

## 2023-06-18 NOTE — Patient Instructions (Signed)
 Below is our plan:  We will continue propranolol and nortriptyline daily. Continue tizanidine, naratriptan and phenergan only as needed. We will submit for Botox coverage. Sign up for Botox savings program.   Please keep an eye on your BP at home. Decrease amlodipine to 5mg  every other day. If BP stays well managed we can stop this in the future.    Please follow up with PCP if dizziness does not improve.   Please make sure you are staying well hydrated. I recommend 50-60 ounces daily. Well balanced diet and regular exercise encouraged. Consistent sleep schedule with 6-8 hours recommended.   Please continue follow up with care team as directed.   Follow up with me in 6 months   You may receive a survey regarding today's visit. I encourage you to leave honest feed back as I do use this information to improve patient care. Thank you for seeing me today!     GENERAL HEADACHE INFORMATION:   Natural supplements: Magnesium Oxide or Magnesium Glycinate 500 mg at bed (up to 800 mg daily) Coenzyme Q10 300 mg in AM Vitamin B2- 200 mg twice a day   Add 1 supplement at a time since even natural supplements can have undesirable side effects. You can sometimes buy supplements cheaper (especially Coenzyme Q10) at www.WebmailGuide.co.za or at [facility].  Migraine with aura: There is increased risk for stroke in women with migraine with aura and a contraindication for the combined contraceptive pill for use by women who have migraine with aura. The risk for women with migraine without aura is lower. However other risk factors like smoking are far more likely to increase stroke risk than migraine. There is a recommendation for no smoking and for the use of OCPs without estrogen such as progestogen only pills particularly for women with migraine with aura.Marland Kitchen People who have migraine headaches with auras may be 3 times more likely to have a stroke caused by a blood clot, compared to migraine patients who don't see  auras. Women who take hormone-replacement therapy may be 30 percent more likely to suffer a clot-based stroke than women not taking medication containing estrogen. Other risk factors like smoking and high blood pressure may be  much more important.    Vitamins and herbs that show potential:   Magnesium: Magnesium (250 mg twice a day or 500 mg at bed) has a relaxant effect on smooth muscles such as blood vessels. Individuals suffering from frequent or daily headache usually have low magnesium levels which can be increase with daily supplementation of 400-750 mg. Three trials found 40-90% average headache reduction  when used as a preventative. Magnesium may help with headaches are aura, the best evidence for magnesium is for migraine with aura is its thought to stop the cortical spreading depression we believe is the pathophysiology of migraine aura.Magnesium also demonstrated the benefit in menstrually related migraine.  Magnesium is part of the messenger system in the serotonin cascade and it is a good muscle relaxant.  It is also useful for constipation which can be a side effect of other medications used to treat migraine. Good sources include nuts, whole grains, and tomatoes. Side Effects: loose stool/diarrhea  Riboflavin (vitamin B 2) 200 mg twice a day. This vitamin assists nerve cells in the production of ATP a principal energy storing molecule.  It is necessary for many chemical reactions in the body.  There have been at least 3 clinical trials of riboflavin using 400 mg per day all of which suggested  that migraine frequency can be decreased.  All 3 trials showed significant improvement in over half of migraine sufferers.  The supplement is found in bread, cereal, milk, meat, and poultry.  Most Americans get more riboflavin than the recommended daily allowance, however riboflavin deficiency is not necessary for the supplements to help prevent headache. Side effects: energizing, green urine   Coenzyme  Q10: This is present in almost all cells in the body and is critical component for the conversion of energy.  Recent studies have shown that a nutritional supplement of CoQ10 can reduce the frequency of migraine attacks by improving the energy production of cells as with riboflavin.  Doses of 150 mg twice a day have been shown to be effective.   Melatonin: Increasing evidence shows correlation between melatonin secretion and headache conditions.  Melatonin supplementation has decreased headache intensity and duration.  It is widely used as a sleep aid.  Sleep is natures way of dealing with migraine.  A dose of 3 mg is recommended to start for headaches including cluster headache. Higher doses up to 15 mg has been reviewed for use in Cluster headache and have been used. The rationale behind using melatonin for cluster is that many theories regarding the cause of Cluster headache center around the disruption of the normal circadian rhythm in the brain.  This helps restore the normal circadian rhythm.   HEADACHE DIET: Foods and beverages which may trigger migraine Note that only 20% of headache patients are food sensitive. You will know if you are food sensitive if you get a headache consistently 20 minutes to 2 hours after eating a certain food. Only cut out a food if it causes headaches, otherwise you might remove foods you enjoy! What matters most for diet is to eat a well balanced healthy diet full of vegetables and low fat protein, and to not miss meals.   Chocolate, other sweets ALL cheeses except cottage and cream cheese Dairy products, yogurt, sour cream, ice cream Liver Meat extracts (Bovril, Marmite, meat tenderizers) Meats or fish which have undergone aging, fermenting, pickling or smoking. These include: Hotdogs,salami,Lox,sausage, mortadellas,smoked salmon, pepperoni, Pickled herring Pods of broad bean (English beans, Chinese pea pods, [place] (fava) beans, lima and navy beans Ripe avocado,  ripe banana Yeast extracts or active yeast preparations such as Brewer's or Fleishman's (commercial bakes goods are permitted) Tomato based foods, pizza (lasagna, etc.)   MSG (monosodium glutamate) is disguised as many things; look for these common aliases: Monopotassium glutamate Autolysed yeast Hydrolysed protein Sodium caseinate "flavorings" "all natural preservatives" Nutrasweet   Avoid all other foods that convincingly provoke headaches.   Resources: The Dizzy [place] Laundry Your Headache Diet, migrainestrong.com  https://zamora-andrews.com/   Caffeine and Migraine For patients that have migraine, caffeine intake more than 3 days per week can lead to dependency and increased migraine frequency. I would recommend cutting back on your caffeine intake as best you can. The recommended amount of caffeine is 200-300 mg daily, although migraine patients may experience dependency at even lower doses. While you may notice an increase in headache temporarily, cutting back will be helpful for headaches in the long run. For more information on caffeine and migraine, visit: https://americanmigrainefoundation.org/resource-library/caffeine-and-migraine/   Headache Prevention Strategies:   1. Maintain a headache diary; learn to identify and avoid triggers.  - This can be a simple note where you log when you had a headache, associated symptoms, and medications used - There are several smartphone apps developed to help track migraines: Migraine Buddy,  Migraine Monitor, Curelator N1-Headache App   Common triggers include: Emotional triggers: Emotional/Upset family or friends Emotional/Upset occupation Business reversal/success Anticipation anxiety Crisis-serious Post-crisis periodNew job/position   Physical triggers: Vacation Day Weekend Strenuous Exercise High Altitude Location New Move Menstrual Day Physical Illness Oversleep/Not enough  sleep Weather changes Light: Photophobia or light sesnitivity treatment involves a balance between desensitization and reduction in overly strong input. Use dark polarized glasses outside, but not inside. Avoid bright or fluorescent light, but do not dim environment to the point that going into a normally lit room hurts. Consider FL-41 tint lenses, which reduce the most irritating wavelengths without blocking too much light.  These can be obtained at axonoptics.com or theraspecs.com Foods: see list above.   2. Limit use of acute treatments (over-the-counter medications, triptans, etc.) to no more than 2 days per week or 10 days per month to prevent medication overuse headache (rebound headache).     3. Follow a regular schedule (including weekends and holidays): Don't skip meals. Eat a balanced diet. 8 hours of sleep nightly. Minimize stress. Exercise 30 minutes per day. Being overweight is associated with a 5 times increased risk of chronic migraine. Keep well hydrated and drink 6-8 glasses of water per day.   4. Initiate non-pharmacologic measures at the earliest onset of your headache. Rest and quiet environment. Relax and reduce stress. Breathe2Relax is a free app that can instruct you on    some simple relaxtion and breathing techniques. Http://Dawnbuse.com is a    free website that provides teaching videos on relaxation.  Also, there are  many apps that   can be downloaded for "mindful" relaxation.  An app called YOGA NIDRA will help walk you through mindfulness. Another app called Calm can be downloaded to give you a structured mindfulness guide with daily reminders and skill development. Headspace for guided meditation Mindfulness Based Stress Reduction Online Course: www.palousemindfulness.com Cold compresses.   5. Don't wait!! Take the maximum allowable dosage of prescribed medication at the first sign of migraine.   6. Compliance:  Take prescribed medication regularly as directed and  at the first sign of a migraine.   7. Communicate:  Call your physician when problems arise, especially if your headaches change, increase in frequency/severity, or become associated with neurological symptoms (weakness, numbness, slurred speech, etc.). Proceed to emergency room if you experience new or worsening symptoms or symptoms do not resolve, if you have new neurologic symptoms or if headache is severe, or for any concerning symptom.   8. Headache/pain management therapies: Consider various complementary methods, including medication, behavioral therapy, psychological counselling, biofeedback, massage therapy, acupuncture, dry needling, and other modalities.  Such measures may reduce the need for medications. Counseling for pain management, where patients learn to function and ignore/minimize their pain, seems to work very well.   9. Recommend changing family's attention and focus away from patient's headaches. Instead, emphasize daily activities. If first question of day is 'How are your headaches/Do you have a headache today?', then patient will constantly think about headaches, thus making them worse. Goal is to re-direct attention away from headaches, toward daily activities and other distractions.   10. Helpful Websites: www.AmericanHeadacheSociety.org PatentHood.ch www.headaches.org TightMarket.nl www.achenet.org

## 2023-06-18 NOTE — Progress Notes (Signed)
 Chief Complaint  Patient presents with   Follow-up    Pt in 2 Pt here for Migraine f/u Pt has questions about FMLA paperwork Pt wants to revisit botox injections     HISTORY OF PRESENT ILLNESS:  06/22/23 ALL:  Cristina Zimmerman is a 38 y.o. female here today for follow up for migraines. She was last seen 06/2022 and doing failry well on amlodipine 5mg , nortriptyline 50mg  and propranolol 60mg  daily. Tizanidine, phenergan and naratriptan continued as needed. Since, she reports having 12-16 headache days a month. She has about 4-8 migraines per month. Naratriptan usually helps abort migraine. She uses tizanidine at night for neck tension. Phenergan on occasion for nausea. She has had concerns of dizziness. She reports recent concerns of heavy menstrual cycles and low iron levels. Unsure what typical BP is but feels today's reading of 96/65 is low. She endorses significant anxiety.   Medications tried for migraine include: Ajovy(side effects), topamax(depression), nortriptyline, amlodipine, propranolol, naratriptan, phenergan, tizanidine, nurtec, flexeril, lexapro, toradol, depo-medrol, reglan inj, amerge, zofran, prednisone, imitrex  07/08/22 ALL (Mychart): Cristina Zimmerman is a 38 y.o. female here today for follow up for migraines. She was last seen by Dr Lucia Gaskins 04/2021. She had one procedure of Botox 05/14/2021 but felt it was too expensive. She has continued amlodipine 5mg , nortriptyline 50mg  and propranolol LA 60mg  daily. She uses tizanidine 2mg , phenergan 12.5mg  and naratriptan 2.5mg  PRN. Since last visit, she reports doing well. She has reestablished care with PCP. She is being treated for anemia and hypothyroid. This has helped with migraine intensity. She is having about 4-8 migraine days a month. Abortive meds work well. She does endorse some anxiety that may be playing a role. She is planning to discuss with PCP.   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 She is also planning to see  GI for eval for cause of anemia.   History (copied from Dr Trevor Mace previous note)  04/25/2021: She does not feel Ajovy is helping. She tried it and had GI side effects, her stomach didn't like it. She stopped the Ajovy a year ago and did not tell us(side effects), she says she has been under stress and very busy. On average she has been having at least 8 migraine days a month that are severe or moderate the last 6 months and recently even worsening to 15 migraine days a month the last 3 months, pulsating/pounding/throbbing, nausea, photophobia/phonophobia, nausea, movement makes it worse, can be unilateral, propranolol helped a little bit but still with significant migraines, tizanidine helped, no medication overuse, no aura, discussed botox. Migraines can last 24-72 hours or longer. Failed multiple medications.    Medications tried for migraine include: Ajovy(side effects), topamax(depression), nortriptyline, amlodipine, propranolol, naratriptan, phenergan, tizanidine, nurtec, flexeril, lexapro, toradol, depo-medrol, reglan inj, amerge, zofran, prednisone, imitrex   REVIEW OF SYSTEMS: Out of a complete 14 system review of symptoms, the patient complains only of the following symptoms, dizziness, fatigue and all other reviewed systems are negative.   ALLERGIES: Allergies  Allergen Reactions   Tape    Prednisone     PO prednisone only- Jittery, anxious, elevated blood pressure.   Pt does "okay" with steroid injections Jittery intolerance     HOME MEDICATIONS: Outpatient Medications Prior to Visit  Medication Sig Dispense Refill   albuterol (PROVENTIL HFA;VENTOLIN HFA) 108 (90 Base) MCG/ACT inhaler Inhale 2 puffs into the lungs every 6 (six) hours as needed for wheezing. 1 Inhaler 12   Cholecalciferol 20 MCG (  800 UNIT) TABS Take 1 tablet by mouth daily. 90 tablet 0   estradiol (VIVELLE-DOT) 0.075 MG/24HR Place 1 patch onto the skin once a week. 4 patch 12   Fe Bisgly-Vit C-Vit B12-FA (GENTLE  IRON) 28-60-0.008-0.4 MG CAPS Take 1 capsule by mouth in the morning. 90 capsule 3   Fluticasone-Salmeterol (ADVAIR DISKUS) 100-50 MCG/DOSE AEPB Inhale 1 puff into the lungs 2 (two) times daily. Inhale 1 puff into lungs bid 60 each 3   levothyroxine (SYNTHROID) 125 MCG tablet TAKE 1 TABLET (125 MCG TOTAL) BY MOUTH DAILY. PT NEEDS APT FOR ADDITIONAL REFILLS 14 tablet 0   medroxyPROGESTERone (PROVERA) 10 MG tablet TAKE 1 TABLET BY MOUTH EVERY DAY FOR 10 DAYS PER MONTH 30 tablet 3   Multiple Vitamin (MULTIVITAMIN) tablet Take 1 tablet by mouth daily.       omeprazole (PRILOSEC) 40 MG capsule TAKE 1 CAPSULE BY MOUTH EVERY DAY 30 capsule 0   ondansetron (ZOFRAN-ODT) 8 MG disintegrating tablet Take 1 tablet (8 mg total) by mouth every 8 (eight) hours as needed (prior to meals to rpevent diarrhea). 90 tablet 1   promethazine (PHENERGAN) 12.5 MG tablet Take 1 tablet (12.5 mg total) by mouth every 6 (six) hours as needed for nausea or vomiting. 30 tablet 11   amLODipine (NORVASC) 5 MG tablet Take 1 tablet (5 mg total) by mouth daily. 90 tablet 3   naratriptan (AMERGE) 2.5 MG tablet TAKE 1 TABLET AS NEEDED MIGRAINE MAY REPEAT AFTER 2 HOURS MAX 2 PER 24 HOURS. 12 tablet 11   nortriptyline (PAMELOR) 50 MG capsule Take 1 capsule (50 mg total) by mouth at bedtime. 90 capsule 3   propranolol ER (INDERAL LA) 60 MG 24 hr capsule Take 1 capsule (60 mg total) by mouth daily. 90 capsule 3   tizanidine (ZANAFLEX) 2 MG capsule Take 1 capsule (2 mg total) by mouth 3 (three) times daily. 60 capsule 1   estradiol (CLIMARA - DOSED IN MG/24 HR) 0.1 mg/24hr patch PLACE 1 PATCH (0.1 MG TOTAL) ONTO THE SKIN ONCE A WEEK. 12 patch 0   fexofenadine (ALLEGRA ALLERGY) 180 MG tablet Take 1 tablet (180 mg total) by mouth daily for 15 days. 15 tablet 0   No facility-administered medications prior to visit.     PAST MEDICAL HISTORY: Past Medical History:  Diagnosis Date   Allergy    Anxiety    Hematuria    Hip dysplasia     Hyperlipidemia    Hypertension    Migraine    NASH (nonalcoholic steatohepatitis)    Osteoporosis    Scoliosis    minimal   Thyroid disease    Turner syndrome    Vitamin D deficiency      PAST SURGICAL HISTORY: Past Surgical History:  Procedure Laterality Date   BILATERAL OOPHORECTOMY     HERNIA REPAIR     x2   HIP SURGERY     KYPHOPLASTY  08/2016   L2   TONSILLECTOMY     TYMPANOSTOMY TUBE PLACEMENT       FAMILY HISTORY: Family History  Problem Relation Age of Onset   Hypertension Mother    Arthritis Mother    Fibromyalgia Mother    Hyperlipidemia Mother    Migraines Mother    Diabetes Father    Alcohol abuse Maternal Grandfather    Cancer Maternal Grandfather        BRAIN   Diabetes Paternal Grandmother    Stroke Paternal Grandmother    Kidney disease Paternal Grandmother  Heart attack Paternal Grandmother    Hypertension Paternal Grandmother    Hyperlipidemia Paternal Grandmother    Diabetes Paternal Grandfather      SOCIAL HISTORY: Social History   Socioeconomic History   Marital status: Single    Spouse name: Not on file   Number of children: Not on file   Years of education: Not on file   Highest education level: Some college, no degree  Occupational History   Not on file  Tobacco Use   Smoking status: Never   Smokeless tobacco: Never  Vaping Use   Vaping status: Never Used  Substance and Sexual Activity   Alcohol use: Not Currently    Comment: socially   Drug use: No   Sexual activity: Yes    Birth control/protection: Patch  Other Topics Concern   Not on file  Social History Narrative   Lives alone    Caffeine use: 2 cups daily   Pt works    Social Drivers of Corporate investment banker Strain: Not on file  Food Insecurity: Not on file  Transportation Needs: Not on file  Physical Activity: Not on file  Stress: Not on file  Social Connections: Unknown (07/19/2022)   Received from [employer], [facility]   Social Network     Social Network: Not on file  Intimate Partner Violence: Not At Risk (07/19/2022)   Received from [facility], [facility]   HITS    Over the last 12 months how often did your partner physically hurt you?: Never    Over the last 12 months how often did your partner insult you or talk down to you?: Never    Over the last 12 months how often did your partner threaten you with physical harm?: Never    Over the last 12 months how often did your partner scream or curse at you?: Never     PHYSICAL EXAM  Vitals:   06/22/23 1125  BP: 96/65  Pulse: (!) 55  Weight: 175 lb (79.4 kg)  Height: 5\' 2"  (1.575 m)   Body mass index is 32.01 kg/m.  Generalized: Well developed, in no acute distress  Cardiology: normal rate and rhythm, no murmur auscultated  Respiratory: clear to auscultation bilaterally    Neurological examination  Mentation: Alert oriented to time, place, history taking. Follows all commands speech and language fluent Cranial nerve II-XII: Pupils were equal round reactive to light. Extraocular movements were full, visual field were full on confrontational test. Facial sensation and strength were normal. Uvula tongue midline. Head turning and shoulder shrug  were normal and symmetric. Motor: The motor testing reveals 5 over 5 strength of all 4 extremities. Good symmetric motor tone is noted throughout.  Gait and station: Gait is normal.    DIAGNOSTIC DATA (LABS, IMAGING, TESTING) - I reviewed patient records, labs, notes, testing and imaging myself where available.  Lab Results  Component Value Date   WBC 6.6 09/22/2022   HGB 11.2 09/22/2022   HCT 36.7 09/22/2022   MCV 81 09/22/2022   PLT 331 09/22/2022      Component Value Date/Time   NA 138 01/27/2022 0948   K 4.0 01/27/2022 0948   CL 104 01/27/2022 0948   CO2 25 01/27/2022 0948   GLUCOSE 91 01/27/2022 0948   BUN 9 01/27/2022 0948   CREATININE 0.90 01/27/2022 0948   CALCIUM 8.8 01/27/2022 0948   PROT 7.2  01/27/2022 0948   ALBUMIN 4.5 11/28/2015 1730   AST 26 01/27/2022  0948   ALT 31 (H) 01/27/2022 0948   ALKPHOS 72 11/28/2015 1730   BILITOT 0.4 01/27/2022 0948   GFRNONAA 96 07/25/2019 1213   GFRAA 111 07/25/2019 1213   Lab Results  Component Value Date   CHOL 251 (H) 01/27/2022   HDL 40 (L) 01/27/2022   LDLCALC 175 (H) 01/27/2022   TRIG 196 (H) 01/27/2022   CHOLHDL 6.3 (H) 01/27/2022   No results found for: "HGBA1C" No results found for: "VITAMINB12" Lab Results  Component Value Date   TSH 4.71 (H) 03/03/2022        No data to display               No data to display           ASSESSMENT AND PLAN  38 y.o. year old female  has a past medical history of Allergy, Anxiety, Hematuria, Hip dysplasia, Hyperlipidemia, Hypertension, Migraine, NASH (nonalcoholic steatohepatitis), Osteoporosis, Scoliosis, Thyroid disease, Turner syndrome, and Vitamin D deficiency. here with    Chronic migraine without aura with status migrainosus, not intractable  Essential hypertension - Plan: amLODipine (NORVASC) 5 MG tablet  Dizziness  Anxiety  Cristina Zimmerman reports worsening headaches. Also has dizziness, heavy menstrual cycles, and low iron levels. We will continue decreased amlodipine to 5mg  every other day. May stop if BP stays well managed. She will continue nortriptyline 50mg  and propranolol 60mg  daily. Continue naratriptan, tizanidine and phenergan as needed. We will submit for Botox. I have encouraged her to follow up closely with PCP and GYN for concerns of dizziness, low iron and anxiety. Healthy lifestyle habits encouraged. She will follow up with PCP as directed. She will return to see me in 6 months, sooner if needed. She verbalizes understanding and agreement with this plan.   No orders of the defined types were placed in this encounter.    Meds ordered this encounter  Medications   amLODipine (NORVASC) 5 MG tablet    Sig: Take 1 tablet (5 mg total) by mouth every  other day.    Dispense:  45 tablet    Refill:  3    Supervising Provider:   Anson Fret [[id number]]   naratriptan (AMERGE) 2.5 MG tablet    Sig: TAKE 1 TABLET AS NEEDED MIGRAINE MAY REPEAT AFTER 2 HOURS MAX 2 PER 24 HOURS.    Dispense:  12 tablet    Refill:  11    #12 per 30    Supervising Provider:   Anson Fret [[id number]]   nortriptyline (PAMELOR) 50 MG capsule    Sig: Take 1 capsule (50 mg total) by mouth at bedtime.    Dispense:  90 capsule    Refill:  3    PT REQ REFILLS    Supervising Provider:   Anson Fret [[id number]]   propranolol ER (INDERAL LA) 60 MG 24 hr capsule    Sig: Take 1 capsule (60 mg total) by mouth daily.    Dispense:  90 capsule    Refill:  3    Supervising Provider:   Anson Fret [[id number]]   tizanidine (ZANAFLEX) 2 MG capsule    Sig: Take 1 capsule (2 mg total) by mouth 3 (three) times daily as needed for muscle spasms.    Dispense:  60 capsule    Refill:  0    Supervising Provider:   Anson Fret [[id number]]     Shawnie Dapper, MSN, FNP-C 06/22/2023, 12:25 PM  [facility] [address] [place], [place] [address] [phone]

## 2023-06-22 ENCOUNTER — Ambulatory Visit (INDEPENDENT_AMBULATORY_CARE_PROVIDER_SITE_OTHER): Payer: No Typology Code available for payment source | Admitting: Family Medicine

## 2023-06-22 ENCOUNTER — Encounter: Payer: Self-pay | Admitting: Family Medicine

## 2023-06-22 ENCOUNTER — Telehealth: Payer: Self-pay | Admitting: *Deleted

## 2023-06-22 VITALS — BP 96/65 | HR 55 | Ht 62.0 in | Wt 175.0 lb

## 2023-06-22 DIAGNOSIS — R42 Dizziness and giddiness: Secondary | ICD-10-CM

## 2023-06-22 DIAGNOSIS — I10 Essential (primary) hypertension: Secondary | ICD-10-CM | POA: Diagnosis not present

## 2023-06-22 DIAGNOSIS — F419 Anxiety disorder, unspecified: Secondary | ICD-10-CM | POA: Diagnosis not present

## 2023-06-22 DIAGNOSIS — G43701 Chronic migraine without aura, not intractable, with status migrainosus: Secondary | ICD-10-CM

## 2023-06-22 MED ORDER — TIZANIDINE HCL 2 MG PO CAPS: 2.0000 mg | ORAL_CAPSULE | Freq: Three times a day (TID) | ORAL | 0 refills | Status: DC | PRN

## 2023-06-22 MED ORDER — NARATRIPTAN HCL 2.5 MG PO TABS: ORAL_TABLET | ORAL | 11 refills | Status: AC

## 2023-06-22 MED ORDER — AMLODIPINE BESYLATE 5 MG PO TABS
5.0000 mg | ORAL_TABLET | ORAL | 3 refills | Status: DC
Start: 2023-06-22 — End: 2023-08-11

## 2023-06-22 MED ORDER — NORTRIPTYLINE HCL 50 MG PO CAPS: 50.0000 mg | ORAL_CAPSULE | Freq: Every day | ORAL | 3 refills | Status: AC

## 2023-06-22 MED ORDER — PROPRANOLOL HCL ER 60 MG PO CP24: 60.0000 mg | ORAL_CAPSULE | Freq: Every day | ORAL | 3 refills | Status: AC

## 2023-06-22 NOTE — Telephone Encounter (Signed)
 Needs Botox auth. New start.   Chronic Migraine CPT 64615    Botox J0585 Units:200   G43.701 Chronic migraine without aura with status migrainosus, not intractable

## 2023-06-22 NOTE — Telephone Encounter (Signed)
 Submitted benefit verification [id number].

## 2023-06-24 NOTE — Telephone Encounter (Signed)
 Submitted auth request via CMM, status is pending. Key: [id number]

## 2023-06-25 ENCOUNTER — Other Ambulatory Visit: Payer: Self-pay

## 2023-06-25 ENCOUNTER — Other Ambulatory Visit (HOSPITAL_COMMUNITY): Payer: Self-pay

## 2023-06-25 MED ORDER — BOTOX 200 UNITS IJ SOLR
INTRAMUSCULAR | 2 refills | Status: AC
  Filled 2023-06-25: qty 1, fill #0

## 2023-06-25 NOTE — Telephone Encounter (Signed)
 Received fax of approval, please send rx to [facility]. Please send note of 3/27 appt date on the rx, thank you!  Auth#: [id number] (06/24/23-12/25/23)

## 2023-06-25 NOTE — Addendum Note (Signed)
 Addended by: Raynald Kemp A on: 06/25/2023 07:24 AM   Modules accepted: Orders

## 2023-06-29 DIAGNOSIS — Z0289 Encounter for other administrative examinations: Secondary | ICD-10-CM

## 2023-07-01 ENCOUNTER — Telehealth: Payer: Self-pay | Admitting: *Deleted

## 2023-07-01 NOTE — Telephone Encounter (Signed)
 [facility]ed on 07/01/2023

## 2023-07-10 ENCOUNTER — Telehealth: Payer: Self-pay

## 2023-07-10 ENCOUNTER — Other Ambulatory Visit (HOSPITAL_COMMUNITY): Payer: Self-pay

## 2023-07-10 ENCOUNTER — Other Ambulatory Visit: Payer: Self-pay

## 2023-07-10 NOTE — Telephone Encounter (Signed)
 Per test claim PT can not use our specialty pharmacy-looks like she is contracted per her plan-would advice pt to call her plan to verify which pharmacy to send to or it is usually listed on the [employer] benefits verification report. Thanks.

## 2023-07-13 NOTE — Telephone Encounter (Signed)
 Received approval from [facility] for buy/bill.  Auth#: [id number] (07/16/23-01/15/24)

## 2023-07-13 NOTE — Progress Notes (Unsigned)
 07/13/23 ALL: Cristina Zimmerman presents to start Botox. Baseline 12-16 headache days a month. She has about 4-8 migraines per month on propranolol and nortriptyline. Naratriptan usually helps abort migraine. She uses tizanidine at night for neck tension. Phenergan on occasion for nausea.    Consent Form Botulism Toxin Injection For Chronic Migraine    Reviewed orally with patient, additionally signature is on file:  Botulism toxin has been approved by the Federal drug administration for treatment of chronic migraine. Botulism toxin does not cure chronic migraine and it may not be effective in some patients.  The administration of botulism toxin is accomplished by injecting a small amount of toxin into the muscles of the neck and head. Dosage must be titrated for each individual. Any benefits resulting from botulism toxin tend to wear off after 3 months with a repeat injection required if benefit is to be maintained. Injections are usually done every 3-4 months with maximum effect peak achieved by about 2 or 3 weeks. Botulism toxin is expensive and you should be sure of what costs you will incur resulting from the injection.  The side effects of botulism toxin use for chronic migraine may include:   -Transient, and usually mild, facial weakness with facial injections  -Transient, and usually mild, head or neck weakness with head/neck injections  -Reduction or loss of forehead facial animation due to forehead muscle weakness  -Eyelid drooping  -Dry eye  -Pain at the site of injection or bruising at the site of injection  -Double vision  -Potential unknown long term risks   Contraindications: You should not have Botox if you are pregnant, nursing, allergic to albumin, have an infection, skin condition, or muscle weakness at the site of the injection, or have myasthenia gravis, Lambert-Eaton syndrome, or ALS.  It is also possible that as with any injection, there may be an allergic reaction or no  effect from the medication. Reduced effectiveness after repeated injections is sometimes seen and rarely infection at the injection site may occur. All care will be taken to prevent these side effects. If therapy is given over a long time, atrophy and wasting in the muscle injected may occur. Occasionally the patient's become refractory to treatment because they develop antibodies to the toxin. In this event, therapy needs to be modified.  I have read the above information and consent to the administration of botulism toxin.    BOTOX PROCEDURE NOTE FOR MIGRAINE HEADACHE  Contraindications and precautions discussed with patient(above). Aseptic procedure was observed and patient tolerated procedure. Procedure performed by Shawnie Dapper, FNP-C.   The condition has existed for more than 6 months, and pt does not have a diagnosis of ALS, Myasthenia Gravis or Lambert-Eaton Syndrome.  Risks and benefits of injections discussed and pt agrees to proceed with the procedure.  Written consent obtained  These injections are medically necessary. Pt  receives good benefits from these injections. These injections do not cause sedations or hallucinations which the oral therapies may cause.   Description of procedure:  The patient was placed in a sitting position. The standard protocol was used for Botox as follows, with 5 units of Botox injected at each site:  -Procerus muscle, midline injection  -Corrugator muscle, bilateral injection  -Frontalis muscle, bilateral injection, with 2 sites each side, medial injection was performed in the upper one third of the frontalis muscle, in the region vertical from the medial inferior edge of the superior orbital rim. The lateral injection was again in the upper one  third of the forehead vertically above the lateral limbus of the cornea, 1.5 cm lateral to the medial injection site.  -Temporalis muscle injection, 4 sites, bilaterally. The first injection was 3 cm above the  tragus of the ear, second injection site was 1.5 cm to 3 cm up from the first injection site in line with the tragus of the ear. The third injection site was 1.5-3 cm forward between the first 2 injection sites. The fourth injection site was 1.5 cm posterior to the second injection site. 5th site laterally in the temporalis  muscleat the level of the outer canthus.  -Occipitalis muscle injection, 3 sites, bilaterally. The first injection was done one half way between the occipital protuberance and the tip of the mastoid process behind the ear. The second injection site was done lateral and superior to the first, 1 fingerbreadth from the first injection. The third injection site was 1 fingerbreadth superiorly and medially from the first injection site.  -Cervical paraspinal muscle injection, 2 sites, bilaterally. The first injection site was 1 cm from the midline of the cervical spine, 3 cm inferior to the lower border of the occipital protuberance. The second injection site was 1.5 cm superiorly and laterally to the first injection site.  -Trapezius muscle injection was performed at 3 sites, bilaterally. The first injection site was in the upper trapezius muscle halfway between the inflection point of the neck, and the acromion. The second injection site was one half way between the acromion and the first injection site. The third injection was done between the first injection site and the inflection point of the neck.   Will return for repeat injection in 3 months.   A total of 200 units of Botox was prepared, 155 units of Botox was injected as documented above, any Botox not injected was wasted. The patient tolerated the procedure well, there were no complications of the above procedure.

## 2023-07-13 NOTE — Telephone Encounter (Signed)
 [employer] reported [facility] as covered SP, they no longer dispense Botox. I submitted urgent auth request to [employer] to see if pt can be covered under buy/bill. This was sent to the pharmacy on 3/6, since we are now a couple days out from pt's appt and finding out [facility] cannot fill.

## 2023-07-16 ENCOUNTER — Ambulatory Visit (INDEPENDENT_AMBULATORY_CARE_PROVIDER_SITE_OTHER): Admitting: Family Medicine

## 2023-07-16 DIAGNOSIS — G43701 Chronic migraine without aura, not intractable, with status migrainosus: Secondary | ICD-10-CM | POA: Diagnosis not present

## 2023-07-16 MED ORDER — ONABOTULINUMTOXINA 200 UNITS IJ SOLR
155.0000 [IU] | Freq: Once | INTRAMUSCULAR | Status: AC
  Administered 2023-07-16: 155 [IU] via INTRAMUSCULAR

## 2023-07-16 NOTE — Progress Notes (Signed)
 Botox-200U x 1vial Lot: [id number] Expiration: 07/2025 NDC: 3086-5784-69   Bacteriostatic 0.9% Sodium Chloride- 4mL total GEX:[id number] Expiration:11//04/2023 NDC: 2440-1027-25   Dx: G43.701  Buy/bill Witnessed by: Otilio Connors

## 2023-08-10 ENCOUNTER — Encounter: Payer: Self-pay | Admitting: Family Medicine

## 2023-08-11 ENCOUNTER — Other Ambulatory Visit: Payer: Self-pay | Admitting: *Deleted

## 2023-08-11 DIAGNOSIS — I10 Essential (primary) hypertension: Secondary | ICD-10-CM

## 2023-08-11 MED ORDER — AMLODIPINE BESYLATE 5 MG PO TABS: 5.0000 mg | ORAL_TABLET | ORAL | 3 refills | Status: DC

## 2023-08-11 NOTE — Telephone Encounter (Signed)
 Spoke to patient made her aware that FMLA has been updated 2-3 days per month  1  episode lasting 24 hours . Pt thanked me for calling

## 2023-08-27 ENCOUNTER — Encounter: Payer: Self-pay | Admitting: Family Medicine

## 2023-10-15 ENCOUNTER — Ambulatory Visit (INDEPENDENT_AMBULATORY_CARE_PROVIDER_SITE_OTHER): Admitting: Family Medicine

## 2023-10-15 VITALS — BP 105/70 | HR 90 | Ht 62.0 in | Wt 178.8 lb

## 2023-10-15 DIAGNOSIS — G43701 Chronic migraine without aura, not intractable, with status migrainosus: Secondary | ICD-10-CM

## 2023-10-15 MED ORDER — ONABOTULINUMTOXINA 200 UNITS IJ SOLR
155.0000 [IU] | Freq: Once | INTRAMUSCULAR | Status: AC
  Administered 2023-10-15: 155 [IU] via INTRAMUSCULAR

## 2023-10-15 NOTE — Progress Notes (Signed)
 Botox -200u vialx1 Lot: [id number] Expiration: 01/2026 NDC: 9976-6078-97  0.9% Sodium Chloride bacteriostatic 4mL total Lot: [id number] Expiration: 10//31/2026 NDC: 9590-8033-97  Witnessed by: Briant Golden, CMA  Buy/bill  Dx: H56.298

## 2023-10-15 NOTE — Progress Notes (Signed)
 10/15/23 ALL: Cristina Zimmerman returns for Botox . 1st procedure 06/2023. She reports about 30% improvement in headache intensity and frequency. She tolerated procedure well.   07/16/2023 ALL: Cristina Zimmerman presents to start Botox . Baseline 12-16 headache days a month. She has about 4-8 migraines per month on propranolol  and nortriptyline . Naratriptan  usually helps abort migraine. She uses tizanidine  at night for neck tension. Phenergan  on occasion for nausea.    Consent Form Botulism Toxin Injection For Chronic Migraine    Reviewed orally with patient, additionally signature is on file:  Botulism toxin has been approved by the Federal drug administration for treatment of chronic migraine. Botulism toxin does not cure chronic migraine and it may not be effective in some patients.  The administration of botulism toxin is accomplished by injecting a small amount of toxin into the muscles of the neck and head. Dosage must be titrated for each individual. Any benefits resulting from botulism toxin tend to wear off after 3 months with a repeat injection required if benefit is to be maintained. Injections are usually done every 3-4 months with maximum effect peak achieved by about 2 or 3 weeks. Botulism toxin is expensive and you should be sure of what costs you will incur resulting from the injection.  The side effects of botulism toxin use for chronic migraine may include:   -Transient, and usually mild, facial weakness with facial injections  -Transient, and usually mild, head or neck weakness with head/neck injections  -Reduction or loss of forehead facial animation due to forehead muscle weakness  -Eyelid drooping  -Dry eye  -Pain at the site of injection or bruising at the site of injection  -Double vision  -Potential unknown long term risks   Contraindications: You should not have Botox  if you are pregnant, nursing, allergic to albumin, have an infection, skin condition, or muscle weakness at the site  of the injection, or have myasthenia gravis, Lambert-Eaton syndrome, or ALS.  It is also possible that as with any injection, there may be an allergic reaction or no effect from the medication. Reduced effectiveness after repeated injections is sometimes seen and rarely infection at the injection site may occur. All care will be taken to prevent these side effects. If therapy is given over a long time, atrophy and wasting in the muscle injected may occur. Occasionally the patient's become refractory to treatment because they develop antibodies to the toxin. In this event, therapy needs to be modified.  I have read the above information and consent to the administration of botulism toxin.    BOTOX  PROCEDURE NOTE FOR MIGRAINE HEADACHE  Contraindications and precautions discussed with patient(above). Aseptic procedure was observed and patient tolerated procedure. Procedure performed by Greig Forbes, FNP-C.   The condition has existed for more than 6 months, and pt does not have a diagnosis of ALS, Myasthenia Gravis or Lambert-Eaton Syndrome.  Risks and benefits of injections discussed and pt agrees to proceed with the procedure.  Written consent obtained  These injections are medically necessary. Pt  receives good benefits from these injections. These injections do not cause sedations or hallucinations which the oral therapies may cause.   Description of procedure:  The patient was placed in a sitting position. The standard protocol was used for Botox  as follows, with 5 units of Botox  injected at each site:  -Procerus muscle, midline injection  -Corrugator muscle, bilateral injection  -Frontalis muscle, bilateral injection, with 2 sites each side, medial injection was performed in the upper one third of the  frontalis muscle, in the region vertical from the medial inferior edge of the superior orbital rim. The lateral injection was again in the upper one third of the forehead vertically above the  lateral limbus of the cornea, 1.5 cm lateral to the medial injection site.  -Temporalis muscle injection, 4 sites, bilaterally. The first injection was 3 cm above the tragus of the ear, second injection site was 1.5 cm to 3 cm up from the first injection site in line with the tragus of the ear. The third injection site was 1.5-3 cm forward between the first 2 injection sites. The fourth injection site was 1.5 cm posterior to the second injection site. 5th site laterally in the temporalis  muscleat the level of the outer canthus.  -Occipitalis muscle injection, 3 sites, bilaterally. The first injection was done one half way between the occipital protuberance and the tip of the mastoid process behind the ear. The second injection site was done lateral and superior to the first, 1 fingerbreadth from the first injection. The third injection site was 1 fingerbreadth superiorly and medially from the first injection site.  -Cervical paraspinal muscle injection, 2 sites, bilaterally. The first injection site was 1 cm from the midline of the cervical spine, 3 cm inferior to the lower border of the occipital protuberance. The second injection site was 1.5 cm superiorly and laterally to the first injection site.  -Trapezius muscle injection was performed at 3 sites, bilaterally. The first injection site was in the upper trapezius muscle halfway between the inflection point of the neck, and the acromion. The second injection site was one half way between the acromion and the first injection site. The third injection was done between the first injection site and the inflection point of the neck.   Will return for repeat injection in 3 months.   A total of 200 units of Botox  was prepared, 155 units of Botox  was injected as documented above, any Botox  not injected was wasted. The patient tolerated the procedure well, there were no complications of the above procedure.

## 2023-10-17 ENCOUNTER — Emergency Department (HOSPITAL_BASED_OUTPATIENT_CLINIC_OR_DEPARTMENT_OTHER)

## 2023-10-17 ENCOUNTER — Other Ambulatory Visit: Payer: Self-pay

## 2023-10-17 ENCOUNTER — Observation Stay (HOSPITAL_BASED_OUTPATIENT_CLINIC_OR_DEPARTMENT_OTHER)
Admission: EM | Admit: 2023-10-17 | Discharge: 2023-10-18 | Disposition: A | Discharge status: Home / Self Care | Attending: Emergency Medicine | Admitting: Emergency Medicine

## 2023-10-17 ENCOUNTER — Encounter (HOSPITAL_BASED_OUTPATIENT_CLINIC_OR_DEPARTMENT_OTHER): Payer: Self-pay | Admitting: Emergency Medicine

## 2023-10-17 DIAGNOSIS — F32A Depression, unspecified: Secondary | ICD-10-CM | POA: Diagnosis not present

## 2023-10-17 DIAGNOSIS — Z743 Need for continuous supervision: Secondary | ICD-10-CM | POA: Diagnosis not present

## 2023-10-17 DIAGNOSIS — N939 Abnormal uterine and vaginal bleeding, unspecified: Principal | ICD-10-CM

## 2023-10-17 DIAGNOSIS — D62 Acute posthemorrhagic anemia: Secondary | ICD-10-CM | POA: Diagnosis not present

## 2023-10-17 DIAGNOSIS — E039 Hypothyroidism, unspecified: Secondary | ICD-10-CM | POA: Diagnosis not present

## 2023-10-17 DIAGNOSIS — I10 Essential (primary) hypertension: Secondary | ICD-10-CM | POA: Diagnosis not present

## 2023-10-17 DIAGNOSIS — J45909 Unspecified asthma, uncomplicated: Secondary | ICD-10-CM | POA: Diagnosis not present

## 2023-10-17 DIAGNOSIS — Z79899 Other long term (current) drug therapy: Secondary | ICD-10-CM | POA: Insufficient documentation

## 2023-10-17 DIAGNOSIS — N920 Excessive and frequent menstruation with regular cycle: Secondary | ICD-10-CM | POA: Diagnosis not present

## 2023-10-17 DIAGNOSIS — R42 Dizziness and giddiness: Secondary | ICD-10-CM | POA: Diagnosis present

## 2023-10-17 DIAGNOSIS — F419 Anxiety disorder, unspecified: Secondary | ICD-10-CM | POA: Diagnosis not present

## 2023-10-17 LAB — CBC
HCT: 19.8 % — ABNORMAL LOW (ref 36.0–46.0)
Hemoglobin: 6.1 g/dL — CL (ref 12.0–15.0)
MCH: 21.9 pg — ABNORMAL LOW (ref 26.0–34.0)
MCHC: 30.8 g/dL (ref 30.0–36.0)
MCV: 71 fL — ABNORMAL LOW (ref 80.0–100.0)
Platelets: 288 10*3/uL (ref 150–400)
RBC: 2.79 MIL/uL — ABNORMAL LOW (ref 3.87–5.11)
RDW: 21.3 % — ABNORMAL HIGH (ref 11.5–15.5)
WBC: 7.5 10*3/uL (ref 4.0–10.5)
nRBC: 0 % (ref 0.0–0.2)

## 2023-10-17 LAB — HEPATIC FUNCTION PANEL
ALT: 24 U/L (ref 0–44)
AST: 23 U/L (ref 15–41)
Albumin: 3.5 g/dL (ref 3.5–5.0)
Alkaline Phosphatase: 51 U/L (ref 38–126)
Bilirubin, Direct: <0.1 mg/dL (ref 0.0–0.2)
Total Bilirubin: 0.6 mg/dL (ref 0.0–1.2)
Total Protein: 6.1 g/dL — ABNORMAL LOW (ref 6.5–8.1)

## 2023-10-17 LAB — BASIC METABOLIC PANEL WITH GFR
Anion gap: 12 (ref 5–15)
BUN: 13 mg/dL (ref 6–20)
CO2: 21 mmol/L — ABNORMAL LOW (ref 22–32)
Calcium: 8.5 mg/dL — ABNORMAL LOW (ref 8.9–10.3)
Chloride: 102 mmol/L (ref 98–111)
Creatinine, Ser: 0.96 mg/dL (ref 0.44–1.00)
GFR, Estimated: >60 mL/min (ref >60)
Glucose, Bld: 120 mg/dL — ABNORMAL HIGH (ref 70–99)
Potassium: 4.3 mmol/L (ref 3.5–5.1)
Sodium: 135 mmol/L (ref 135–145)

## 2023-10-17 LAB — HCG, SERUM, QUALITATIVE: Preg, Serum: NEGATIVE

## 2023-10-17 LAB — HIV ANTIBODY (ROUTINE TESTING W REFLEX): HIV Screen 4th Generation wRfx: NONREACTIVE

## 2023-10-17 LAB — HEMOGLOBIN A1C
Hgb A1c MFr Bld: 5.3 % (ref 4.8–5.6)
Mean Plasma Glucose: 105.41 mg/dL

## 2023-10-17 LAB — TSH: TSH: 11.397 u[IU]/mL — ABNORMAL HIGH (ref 0.350–4.500)

## 2023-10-17 LAB — T4, FREE: Free T4: 0.92 ng/dL (ref 0.61–1.12)

## 2023-10-17 LAB — PRO BRAIN NATRIURETIC PEPTIDE: Pro Brain Natriuretic Peptide: <36 pg/mL (ref <300.0)

## 2023-10-17 LAB — ABO/RH: ABO/RH(D): O POS

## 2023-10-17 LAB — D-DIMER, QUANTITATIVE: D-Dimer, Quant: <0.27 ug{FEU}/mL (ref 0.00–0.50)

## 2023-10-17 LAB — PREPARE RBC (CROSSMATCH)

## 2023-10-17 MED ORDER — TRANEXAMIC ACID 1000 MG/10ML IV SOLN
1000.0000 mg | Freq: Once | INTRAVENOUS | Status: AC
  Administered 2023-10-17: 1000 mg via INTRAVENOUS
  Filled 2023-10-17: qty 10

## 2023-10-17 MED ORDER — PROCHLORPERAZINE EDISYLATE 10 MG/2ML IJ SOLN: 5.0000 mg | Freq: Four times a day (QID) | INTRAMUSCULAR | Status: DC | PRN

## 2023-10-17 MED ORDER — LEVOTHYROXINE SODIUM 25 MCG PO TABS
125.0000 ug | ORAL_TABLET | Freq: Every day | ORAL | Status: DC
  Administered 2023-10-18: 125 ug via ORAL
  Filled 2023-10-17: qty 1

## 2023-10-17 MED ORDER — NORTRIPTYLINE HCL 25 MG PO CAPS
50.0000 mg | ORAL_CAPSULE | Freq: Every day | ORAL | Status: DC
  Administered 2023-10-17: 50 mg via ORAL
  Filled 2023-10-17: qty 2

## 2023-10-17 MED ORDER — MELATONIN 5 MG PO TABS: 5.0000 mg | ORAL_TABLET | Freq: Every evening | ORAL | Status: DC | PRN

## 2023-10-17 MED ORDER — ACETAMINOPHEN 650 MG RE SUPP
650.0000 mg | Freq: Four times a day (QID) | RECTAL | Status: DC | PRN
Start: 2023-10-17 — End: 2023-10-18

## 2023-10-17 MED ORDER — LACTATED RINGERS IV BOLUS
1000.0000 mL | Freq: Once | INTRAVENOUS | Status: AC
  Administered 2023-10-17: 1000 mL via INTRAVENOUS

## 2023-10-17 MED ORDER — MEGESTROL ACETATE 40 MG PO TABS
40.0000 mg | ORAL_TABLET | Freq: Every day | ORAL | Status: DC
  Administered 2023-10-17 – 2023-10-18 (×2): 40 mg via ORAL
  Filled 2023-10-17 (×2): qty 1

## 2023-10-17 MED ORDER — POLYETHYLENE GLYCOL 3350 17 G PO PACK: 17.0000 g | PACK | Freq: Every day | ORAL | Status: DC | PRN

## 2023-10-17 MED ORDER — MEDROXYPROGESTERONE ACETATE 150 MG/ML IM SUSP
300.0000 mg | Freq: Once | INTRAMUSCULAR | Status: AC
  Administered 2023-10-17: 300 mg via INTRAMUSCULAR
  Filled 2023-10-17: qty 2

## 2023-10-17 MED ORDER — SODIUM CHLORIDE 0.9% IV SOLUTION: Freq: Once | INTRAVENOUS | Status: AC

## 2023-10-17 MED ORDER — SODIUM CHLORIDE 0.9% FLUSH
3.0000 mL | Freq: Two times a day (BID) | INTRAVENOUS | Status: DC
  Administered 2023-10-17: 3 mL via INTRAVENOUS
  Administered 2023-10-18: 10 mL via INTRAVENOUS

## 2023-10-17 MED ORDER — MEDROXYPROGESTERONE ACETATE 10 MG PO TABS
20.0000 mg | ORAL_TABLET | Freq: Every day | ORAL | Status: DC
  Administered 2023-10-17 – 2023-10-18 (×2): 20 mg via ORAL
  Filled 2023-10-17 (×2): qty 2

## 2023-10-17 MED ORDER — AMLODIPINE BESYLATE 5 MG PO TABS
5.0000 mg | ORAL_TABLET | Freq: Every day | ORAL | Status: DC
  Administered 2023-10-18: 5 mg via ORAL
  Filled 2023-10-17: qty 1

## 2023-10-17 MED ORDER — ALBUTEROL SULFATE (2.5 MG/3ML) 0.083% IN NEBU: 2.5000 mg | INHALATION_SOLUTION | Freq: Four times a day (QID) | RESPIRATORY_TRACT | Status: DC | PRN

## 2023-10-17 MED ORDER — ONDANSETRON HCL 4 MG/2ML IJ SOLN
4.0000 mg | Freq: Once | INTRAMUSCULAR | Status: AC
  Administered 2023-10-17: 4 mg via INTRAVENOUS
  Filled 2023-10-17: qty 2

## 2023-10-17 MED ORDER — FLUTICASONE FUROATE-VILANTEROL 100-25 MCG/ACT IN AEPB: 1.0000 | INHALATION_SPRAY | Freq: Every day | RESPIRATORY_TRACT | Status: DC

## 2023-10-17 MED ORDER — ACETAMINOPHEN 325 MG PO TABS: 650.0000 mg | ORAL_TABLET | Freq: Four times a day (QID) | ORAL | Status: DC | PRN

## 2023-10-17 MED ORDER — SODIUM CHLORIDE 0.9% FLUSH: 3.0000 mL | INTRAVENOUS | Status: DC | PRN

## 2023-10-17 NOTE — ED Notes (Signed)
 Pt has no reports of itchiness, no redness noted. Pt verbalized understanding to notify staff of any concerns.

## 2023-10-17 NOTE — ED Provider Notes (Signed)
 Patient seen on arrival after transfer from our affiliated facility.  Bleeding has stopped.  I discussed her presentation with EMS crew on arrival.  Patient remains tachycardic, though clinically seemingly has improved somewhat. Patient I discussed indication for transfusion, risk, benefits and she is amenable to transfusion.  Orders placed. Case discussed with OB, Dr. Jayne.  Patient will receive 300 mg medroxyprogesterone  IM, when appropriate for discharge Megesterol 40 mg daily with anticipated close outpatient follow-up.  No indication for emergent Gyn eval of abnormal ultrasound.  5:59 PM Patient attempt ambulation, was lightheaded, particularly tachycardic.  Now resting, she has mild headache, but her infusion has not yet started, with persistent symptomatic anemia, patient will be admitted for monitoring, management, transfusions.   Garrick Charleston, MD 10/17/23 [id number]

## 2023-10-17 NOTE — ED Notes (Signed)
 Pt in bed, pt denies pain or itchiness at iv site, no hives noted.

## 2023-10-17 NOTE — H&P (Signed)
 History and Physical    Patient: Cristina Zimmerman DOB: 29-Jan-1986 DOA: 10/17/2023 DOS: the patient was seen and examined on 10/17/2023 . PCP: Bevin Bernice RAMAN, DO (Inactive)  Patient coming from: Home Chief complaint: Chief Complaint  Patient presents with   Vaginal Bleeding   HPI:  Cristina Zimmerman is a 38 y.o. 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  Patient also reports some fatigue and shortness of breath.  She reports that she has been having menorrhagia every day for 2 weeks now and is fatigued and dizzy.  No reports of chest pain falls or any other complaints.  Patient went to [facility] today and was given p.o. medroxyprogesterone .     ED Course: Pt in ed at bedside  is alert awake and oriented meeting SIRS criteria Vital signs in the ED were notable for the following:  Vitals:   10/17/23 1815 10/17/23 1830 10/17/23 1841 10/17/23 1849  BP: 121/65 (!) 101/51 91/62 98/68   Pulse: 72 (!) 108 (!) 105 (!) 108  Temp:   98.8 F (37.1 C) 98.2 F (36.8 C)  Resp: (!) 22 18 18 19   SpO2: 91% 100% 100% 100%  TempSrc:   Oral Oral  >>ED evaluation thus far shows: Patient had ultrasound non-OB transvaginal showing heterogeneous endometrium thickened heterogeneous endometrium with a oval masslike area around the right side of the fundus that is 1.7 cm. GYN oncology was consulted who recommended 300 mg of medroxyprogesterone  IM and when ready for discharge start patient on megestrol 40 mg daily with outpatient follow-up. Admission requested for acute blood loss anemia with a hemoglobin of 6.1. BMP today showed a glucose of 120 normal kidney function bicarb 21. CBC shows white count of 7.5 hemoglobin of 6.1.  MCV of 71 RDW of 21.3 platelets of 288. Chest x-ray negative O2  sats  100% on room air. EKG shows sinus rhythm at 99 PR 175 QTc 473 borderline interventricular conduction delay nonspecific ST changes in lateral lead.  >>While in the ED patient received the following: Medications  medroxyPROGESTERone  (PROVERA ) tablet 20 mg (20 mg Oral Given 10/17/23 1318)  0.9 %  sodium chloride infusion (Manually program via Guardrails IV Fluids) (has no administration in time range)  albuterol  (VENTOLIN  HFA) 108 (90 Base) MCG/ACT inhaler 2 puff (has no administration in time range)  amLODipine  (NORVASC ) tablet 5 mg (has no administration in time range)  fluticasone  furoate-vilanterol (BREO ELLIPTA) 100-25 MCG/ACT 1 puff (has no administration in time range)  levothyroxine  (SYNTHROID ) tablet 125 mcg (has no administration in time range)  nortriptyline  (PAMELOR ) capsule 50 mg (has no administration in time range)  lactated ringers bolus 1,000 mL (0 mLs Intravenous Stopped 10/17/23 1347)  tranexamic acid (CYKLOKAPRON) injection 1,000 mg (1,000 mg Intravenous Given 10/17/23 1319)  ondansetron  (ZOFRAN ) injection 4 mg (4 mg Intravenous Given 10/17/23 1347)  medroxyPROGESTERone  (DEPO-PROVERA ) injection 300 mg (300 mg Intramuscular Given 10/17/23 1759)   Review of Systems  Constitutional:  Positive for malaise/fatigue.  Genitourinary:        Menorrhagia.   Neurological:  Positive for dizziness and weakness.   Past Medical History:  Diagnosis Date   Allergy    Anxiety    Hematuria    Hip dysplasia    Hyperlipidemia    Hypertension    Migraine  NASH (nonalcoholic steatohepatitis)    Osteoporosis    Scoliosis    minimal   Thyroid disease    Turner syndrome    Vitamin D  deficiency    Past Surgical History:  Procedure Laterality Date   BILATERAL OOPHORECTOMY     HERNIA REPAIR     x2   HIP SURGERY     KYPHOPLASTY  08/2016   L2   TONSILLECTOMY     TYMPANOSTOMY TUBE PLACEMENT      reports that she has never smoked. She has never used smokeless tobacco. She  reports that she does not currently use alcohol. She reports that she does not use drugs. Allergies  Allergen Reactions   Tape    Prednisone      PO prednisone  only- Jittery, anxious, elevated blood pressure.   Pt does okay with steroid injections Jittery intolerance   Family History  Problem Relation Age of Onset   Hypertension Mother    Arthritis Mother    Fibromyalgia Mother    Hyperlipidemia Mother    Migraines Mother    Diabetes Father    Alcohol abuse Maternal Grandfather    Cancer Maternal Grandfather        BRAIN   Diabetes Paternal Grandmother    Stroke Paternal Grandmother    Kidney disease Paternal Grandmother    Heart attack Paternal Grandmother    Hypertension Paternal Grandmother    Hyperlipidemia Paternal Grandmother    Diabetes Paternal Grandfather    Prior to Admission medications   Medication Sig Start Date End Date Taking? Authorizing Provider  albuterol  (PROVENTIL  HFA;VENTOLIN  HFA) 108 (90 Base) MCG/ACT inhaler Inhale 2 puffs into the lungs every 6 (six) hours as needed for wheezing. 12/30/16   Ines Onetha NOVAK, MD  amLODipine  (NORVASC ) 5 MG tablet Take 1 tablet (5 mg total) by mouth every other day. 08/11/23   Lomax, Amy, NP  botulinum toxin Type A  (BOTOX ) 200 units injection Provider to inject 155 units into the muscles of the head and neck every 12 weeks. Discard remainder 06/25/23   Lomax, Amy, NP  Cholecalciferol  20 MCG (800 UNIT) TABS Take 1 tablet by mouth daily. 01/28/22   Bevin Bernice RAMAN, DO  estradiol  (VIVELLE -DOT) 0.075 MG/24HR Place 1 patch onto the skin once a week. 09/23/22   Constant, Peggy, MD  Fe Bisgly-Vit C-Vit B12-FA (GENTLE IRON ) 28-60-0.008-0.4 MG CAPS Take 1 capsule by mouth in the morning. 02/11/22   Tonette Lauraine HERO, PA-C  Fluticasone -Salmeterol (ADVAIR DISKUS) 100-50 MCG/DOSE AEPB Inhale 1 puff into the lungs 2 (two) times daily. Inhale 1 puff into lungs bid 12/30/16   Ahern, Antonia B, MD  levothyroxine  (SYNTHROID ) 125 MCG tablet TAKE 1  TABLET (125 MCG TOTAL) BY MOUTH DAILY. PT NEEDS APT FOR ADDITIONAL REFILLS 10/09/22   Bevin Bernice RAMAN, DO  medroxyPROGESTERone  (PROVERA ) 10 MG tablet TAKE 1 TABLET BY MOUTH EVERY DAY FOR 10 DAYS PER MONTH 04/05/20   Marsa Edelman, DO  Multiple Vitamin (MULTIVITAMIN) tablet Take 1 tablet by mouth daily.      [provider]  naratriptan  (AMERGE) 2.5 MG tablet TAKE 1 TABLET AS NEEDED MIGRAINE MAY REPEAT AFTER 2 HOURS MAX 2 PER 24 HOURS. 06/22/23   Lomax, Amy, NP  nortriptyline  (PAMELOR ) 50 MG capsule Take 1 capsule (50 mg total) by mouth at bedtime. 06/22/23   Lomax, Amy, NP  omeprazole  (PRILOSEC) 40 MG capsule TAKE 1 CAPSULE BY MOUTH EVERY DAY 03/01/20   Alexander, Natalie, DO  ondansetron  (ZOFRAN -ODT) 8 MG disintegrating tablet Take 1  tablet (8 mg total) by mouth every 8 (eight) hours as needed (prior to meals to rpevent diarrhea). 03/10/18   Alexander, Natalie, DO  promethazine  (PHENERGAN ) 12.5 MG tablet Take 1 tablet (12.5 mg total) by mouth every 6 (six) hours as needed for nausea or vomiting. 04/25/21   Ines Onetha NOVAK, MD  propranolol  ER (INDERAL  LA) 60 MG 24 hr capsule Take 1 capsule (60 mg total) by mouth daily. 06/22/23   Lomax, Amy, NP  tizanidine  (ZANAFLEX ) 2 MG capsule Take 1 capsule (2 mg total) by mouth 3 (three) times daily as needed for muscle spasms. 06/22/23   Lomax, Amy, NP                                                                                 Vitals:   10/17/23 1815 10/17/23 1830 10/17/23 1841 10/17/23 1849  BP: 121/65 (!) 101/51 91/62 98/68   Pulse: 72 (!) 108 (!) 105 (!) 108  Resp: (!) 22 18 18 19   Temp:   98.8 F (37.1 C) 98.2 F (36.8 C)  TempSrc:   Oral Oral  SpO2: 91% 100% 100% 100%   Physical Exam Vitals (pale appearing.) and nursing note reviewed.  Constitutional:      General: She is not in acute distress.    Appearance: She is obese.  HENT:     Head: Normocephalic and atraumatic.     Right Ear: Hearing normal.     Left Ear: Hearing normal.      Nose: No nasal deformity.     Mouth/Throat:     Lips: Pink.   Eyes:     General: Lids are normal.     Extraocular Movements: Extraocular movements intact.    Cardiovascular:     Rate and Rhythm: Normal rate and regular rhythm.     Pulses: Normal pulses.     Heart sounds: Normal heart sounds.  Pulmonary:     Effort: Pulmonary effort is normal.     Breath sounds: Normal breath sounds.  Abdominal:     General: Bowel sounds are normal. There is no distension.     Palpations: Abdomen is soft. There is no mass.     Tenderness: There is no abdominal tenderness.   Musculoskeletal:     Right lower leg: No edema.     Left lower leg: No edema.   Skin:    General: Skin is warm.   Neurological:     General: No focal deficit present.     Mental Status: She is alert and oriented to person, place, and time.     Cranial Nerves: Cranial nerves 2-12 are intact.   Psychiatric:        Speech: Speech normal.     Labs on Admission: I have personally reviewed following labs and imaging studies CBC: Recent Labs  Lab 10/17/23 1141  WBC 7.5  HGB 6.1*  HCT 19.8*  MCV 71.0*  PLT 288   Basic Metabolic Panel: Recent Labs  Lab 10/17/23 1141  NA 135  K 4.3  CL 102  CO2 21*  GLUCOSE 120*  BUN 13  CREATININE 0.96  CALCIUM  8.5*   GFR: Estimated Creatinine Clearance: 77.6 mL/min (by C-G  formula based on SCr of 0.96 mg/dL). Liver Function Tests: No results for input(s): AST, ALT, ALKPHOS, BILITOT, PROT, ALBUMIN in the last 168 hours. No results for input(s): LIPASE, AMYLASE in the last 168 hours. No results for input(s): AMMONIA in the last 168 hours. Coagulation Profile: No results for input(s): INR, PROTIME in the last 168 hours. Cardiac Enzymes: No results for input(s): CKTOTAL, CKMB, CKMBINDEX, TROPONINI in the last 168 hours. BNP (last 3 results) Recent Labs    10/17/23 1141  PROBNP <36.0   HbA1C: No results for input(s): HGBA1C in the last  72 hours. CBG: No results for input(s): GLUCAP in the last 168 hours. Lipid Profile: No results for input(s): CHOL, HDL, LDLCALC, TRIG, CHOLHDL, LDLDIRECT in the last 72 hours. Thyroid Function Tests: No results for input(s): TSH, T4TOTAL, FREET4, T3FREE, THYROIDAB in the last 72 hours. Anemia Panel: No results for input(s): VITAMINB12, FOLATE, FERRITIN, TIBC, IRON , RETICCTPCT in the last 72 hours. Urine analysis:    Component Value Date/Time   BILIRUBINUR neg 07/22/2012 0915   PROTEINUR neg 07/22/2012 0915   UROBILINOGEN negative 07/22/2012 0915   NITRITE neg 07/22/2012 0915   LEUKOCYTESUR Negative 07/22/2012 0915   Radiological Exams on Admission: DG Chest Portable 1 View Result Date: 10/17/2023 CLINICAL DATA:  Dyspnea and dizziness. Patient has been on her. Four 2 weeks with progressively heavier bleeding EXAM: PORTABLE CHEST 1 VIEW COMPARISON:  03/15/2012. FINDINGS: The heart size and mediastinal contours are within normal limits. Both lungs are clear. The visualized skeletal structures are unremarkable. IMPRESSION: No active disease. Electronically Signed   By: Waddell Calk M.D.   On: 10/17/2023 13:53   US  Pelvis Complete Result Date: 10/17/2023 CLINICAL DATA:  Heavy vaginal bleeding 2 weeks. Previous bilateral oophorectomy at age 75 due to Turner syndrome. Patient on estrogen and progesterone . EXAM: TRANSABDOMINAL AND TRANSVAGINAL ULTRASOUND OF PELVIS TECHNIQUE: Both transabdominal and transvaginal ultrasound examinations of the pelvis were performed. Transabdominal technique was performed for global imaging of the pelvis including uterus, ovaries, adnexal regions, and pelvic cul-de-sac. It was necessary to proceed with endovaginal exam following the transabdominal exam to visualize the endometrium. COMPARISON:  None Available. FINDINGS: Uterus Uterus is retroverted. Measurements: 7.2 x 4.7 x 5.2 cm = volume: 81 mL. No fibroids or other mass  visualized. Endometrium Thickness: 19 mm. Endometrium is thickened and somewhat heterogeneous. Oval masslike area along the right side of the fundal endometrium measuring 1.1 x 1.6 x 1.7 cm. Several nabothian cysts over the cervix. Right ovary Previous oophorectomy. Left ovary Previous oophorectomy. Other findings No abnormal free fluid. IMPRESSION: 1. Thickened heterogeneous endometrium measuring 19 mm. Oval masslike area along the right side of the fundal endometrium measuring 1.7 cm. Differential considerations include endometrial hyperplasia, polyp, submucosal fibroid and carcinoma. Recommend OBGYN consultation for consideration of tissue sampling. 2. Previous bilateral oophorectomy. Electronically Signed   By: Toribio Agreste M.D.   On: 10/17/2023 13:47   US  Transvaginal Non-OB Result Date: 10/17/2023 CLINICAL DATA:  Heavy vaginal bleeding 2 weeks. Previous bilateral oophorectomy at age 53 due to Turner syndrome. Patient on estrogen and progesterone . EXAM: TRANSABDOMINAL AND TRANSVAGINAL ULTRASOUND OF PELVIS TECHNIQUE: Both transabdominal and transvaginal ultrasound examinations of the pelvis were performed. Transabdominal technique was performed for global imaging of the pelvis including uterus, ovaries, adnexal regions, and pelvic cul-de-sac. It was necessary to proceed with endovaginal exam following the transabdominal exam to visualize the endometrium. COMPARISON:  None Available. FINDINGS: Uterus Uterus is retroverted. Measurements: 7.2 x 4.7 x 5.2 cm = volume:  81 mL. No fibroids or other mass visualized. Endometrium Thickness: 19 mm. Endometrium is thickened and somewhat heterogeneous. Oval masslike area along the right side of the fundal endometrium measuring 1.1 x 1.6 x 1.7 cm. Several nabothian cysts over the cervix. Right ovary Previous oophorectomy. Left ovary Previous oophorectomy. Other findings No abnormal free fluid. IMPRESSION: 1. Thickened heterogeneous endometrium measuring 19 mm. Oval  masslike area along the right side of the fundal endometrium measuring 1.7 cm. Differential considerations include endometrial hyperplasia, polyp, submucosal fibroid and carcinoma. Recommend OBGYN consultation for consideration of tissue sampling. 2. Previous bilateral oophorectomy. Electronically Signed   By: Toribio Agreste M.D.   On: 10/17/2023 13:47   Data Reviewed: Relevant notes from primary care and specialist visits, past discharge summaries as available in EHR, including Care Everywhere . Prior diagnostic testing as pertinent to current admission diagnoses, Updated medications and problem lists for reconciliation .ED course, including vitals, labs, imaging, treatment and response to treatment,Triage notes, nursing and pharmacy notes and ED provider's notes.Notable results as noted in HPI.Discussed case with EDMD/ ED APP/ or Specialty MD on call and as needed.  Assessment & Plan  >> ABLA/Menorrhagia: Patient given medroxyprogesterone  we will continue with recommendations of GYN, megestrol 40 mg daily with outpatient follow-up. upon discharge. Free T4 TSH. Continue with type and screen and transfuse pt is on her first unit.     >>Hypothyroidism: Cont levothyroxine . FT4 TSH.   >> Elevated Glucose: We will get an A1c.   >>SOB: Attribute to fatigue however with pt on HRT risk of PE is moderate and if dimer is high we will proceed with CTA chest.    DVT prophylaxis:  SCD's  Consults:  None  Advance Care Planning:    Code Status: Full Code   Family Communication:  Mom. Disposition Plan:  Home. Severity of Illness: The appropriate patient status for this patient is OBSERVATION. Observation status is judged to be reasonable and necessary in order to provide the required intensity of service to ensure the patient's safety. The patient's presenting symptoms, physical exam findings, and initial radiographic and laboratory data in the context of their medical condition is felt to place  them at decreased risk for further clinical deterioration. Furthermore, it is anticipated that the patient will be medically stable for discharge from the hospital within 2 midnights of admission.   Unresulted Labs (From admission, onward)     Start     Ordered   10/18/23 0500  Comprehensive metabolic panel  Tomorrow morning,   R        10/17/23 1931   10/18/23 0500  CBC with Differential/Platelet  Tomorrow morning,   R        10/17/23 1932   10/17/23 1933  T4, free  Add-on,   AD        10/17/23 1932   10/17/23 1933  TSH  Add-on,   AD        10/17/23 1932   10/17/23 1929  HIV Antibody (routine testing w rflx)  (HIV Antibody (Routine testing w reflex) panel)  Once,   R        10/17/23 1931   10/17/23 1918  D-dimer, quantitative  Once,   STAT        10/17/23 1920   10/17/23 1917  Hemoglobin A1c  Add-on,   AD        10/17/23 1916   10/17/23 1815  Hepatic function panel  Add-on,   AD  10/17/23 1814            Meds ordered this encounter  Medications   lactated ringers bolus 1,000 mL   tranexamic acid (CYKLOKAPRON) injection 1,000 mg   medroxyPROGESTERone  (PROVERA ) tablet 20 mg   ondansetron  (ZOFRAN ) injection 4 mg   0.9 %  sodium chloride infusion (Manually program via Guardrails IV Fluids)   medroxyPROGESTERone  (DEPO-PROVERA ) injection 300 mg   albuterol  (PROVENTIL ) (2.5 MG/3ML) 0.083% nebulizer solution 2.5 mg   amLODipine  (NORVASC ) tablet 5 mg   fluticasone  furoate-vilanterol (BREO ELLIPTA) 100-25 MCG/ACT 1 puff   levothyroxine  (SYNTHROID ) tablet 125 mcg    PT NEEDS APT FOR ADDITIONAL REFILLS     nortriptyline  (PAMELOR ) capsule 50 mg   OR Linked Order Group    acetaminophen  (TYLENOL ) tablet 650 mg    acetaminophen  (TYLENOL ) suppository 650 mg   sodium chloride flush (NS) 0.9 % injection 3-10 mL   sodium chloride flush (NS) 0.9 % injection 3-10 mL     Orders Placed This Encounter  Procedures   Critical Care   US  Pelvis Complete   US  Transvaginal Non-OB   DG  Chest Portable 1 View   CBC   Basic metabolic panel   hCG, serum, qualitative   Pro Brain natriuretic peptide   Hepatic function panel   Hemoglobin A1c   D-dimer, quantitative   HIV Antibody (routine testing w rflx)   Comprehensive metabolic panel   T4, free   TSH   CBC with Differential/Platelet   Diet Heart Room service appropriate? Yes; Fluid consistency: Thin   Informed Consent Details: Physician/Practitioner Attestation; Transcribe to consent form and obtain patient signature   SCDs   Cardiac Monitoring Continuous x 24 hours Indications for use: Other; other indications for use: Monitor for ischemia   Vital signs   Notify physician (specify)   Mobility Protocol: No Restrictions RN to initiate protocols based on patient's level of care   Refer to Sidebar Report Refer to ICU, Med-Surg, Progressive, and Step-Down Mobility Protocol Sidebars   Initiate Adult Central Line Maintenance and Catheter Protocol for patients with central line (CVC, PICC, Port, Hemodialysis, Trialysis)   If patient diabetic or glucose greater than 140 notify physician for Sliding Scale Insulin Orders   Intake and Output   Do not place and if present remove PureWick   Initiate Oral Care Protocol   Initiate Carrier Fluid Protocol   RN may order General Admission PRN Orders utilizing General Admission PRN medications (through manage orders) for the following patient needs: allergy symptoms (Claritin), cold sores (Carmex), cough (Robitussin DM), eye irritation (Liquifilm Tears), hemorrhoids (Tucks), indigestion (Maalox), minor skin irritation (Hydrocortisone Cream), muscle pain Lucienne Gay), nose irritation (saline nasal spray) and sore throat (Chloraseptic spray).   Ambulate with assistance   Full code   Consult to obstetrics / gynecology   Consult to obstetrics / gynecology   Consult to hospitalist   Pulse oximetry check with vital signs   Oxygen therapy Mode or (Route): Nasal cannula; Liters Per Minute: 2;  Keep O2 saturation between: greater than 92 %   Pulse oximetry, continuous   ED EKG   EKG 12-Lead   EKG   EKG   Type and screen [facility]   Prepare RBC (crossmatch)   ABO/Rh   Place in observation (patient's expected length of stay will be less than 2 midnights)   Aspiration precautions   Fall precautions    Author: Mario LULLA Blanch, MD 12 pm- 8 pm. [facility]. 10/17/2023 7:34  PM Please note for any communication after hours contact [facility] Assigned provider on call on Amion.

## 2023-10-17 NOTE — ED Triage Notes (Signed)
 Pt reported normal menstrual cycle started 2 wks ago; bleeding is heavier this week; now c/o dizziness/lightheadedness, SHOB, balance is off, nausea; pt is pale

## 2023-10-17 NOTE — ED Notes (Signed)
 Transfusion started, pt prepped to alert staff for feelings of pain at iv site, itchiness, hives, shortness of breath or any other concerns, pt verbalized understanding.  Staying with pt for the next 15 minutes

## 2023-10-17 NOTE — ED Notes (Addendum)
 Holding on depo shot for provider to talk with patient. MD aware

## 2023-10-17 NOTE — ED Notes (Signed)
 Patient receiving blood, collect labs 2 hours post

## 2023-10-17 NOTE — ED Notes (Signed)
 Assisted to BR, via w/c. C/o of dizziness upon standing. EDP informed

## 2023-10-17 NOTE — ED Notes (Signed)
Ambulatory to RR

## 2023-10-17 NOTE — ED Provider Notes (Addendum)
 [place] [facility] AT [facility] Provider Note   CSN: [id number] Arrival date & time: 10/17/23  1126     Patient presents with: Vaginal Bleeding   Cristina Zimmerman is a 38 y.o. female.   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  Patient reports that she has irregular periods, has had heavy vaginal bleeding in the past, has had anemia.  She reports that starting 2 weeks ago she began to have vaginal bleeding.  She reports that this has been worse than previously.  She does not have ovaries.   Vaginal Bleeding      Prior to Admission medications   Medication Sig Start Date End Date Taking? Authorizing Provider  albuterol  (PROVENTIL  HFA;VENTOLIN  HFA) 108 (90 Base) MCG/ACT inhaler Inhale 2 puffs into the lungs every 6 (six) hours as needed for wheezing. 12/30/16   Ines Onetha NOVAK, MD  amLODipine  (NORVASC ) 5 MG tablet Take 1 tablet (5 mg total) by mouth every other day. 08/11/23   Lomax, Amy, NP  botulinum toxin Type A  (BOTOX ) 200 units injection Provider to inject 155 units into the muscles of the head and neck every 12 weeks. Discard remainder 06/25/23   Lomax, Amy, NP  Cholecalciferol  20 MCG (800 UNIT) TABS Take 1 tablet by mouth daily. 01/28/22   Bevin Bernice RAMAN, DO  estradiol  (VIVELLE -DOT) 0.075 MG/24HR Place 1 patch onto the skin once a week. 09/23/22   Constant, Peggy, MD  Fe Bisgly-Vit C-Vit B12-FA (GENTLE IRON ) 28-60-0.008-0.4 MG CAPS Take 1 capsule by mouth in the morning. 02/11/22   Tonette Lauraine HERO, PA-C  Fluticasone -Salmeterol (ADVAIR DISKUS) 100-50 MCG/DOSE AEPB Inhale 1 puff into the lungs 2 (two) times daily. Inhale 1 puff into lungs bid 12/30/16   Ines Onetha NOVAK, MD  levothyroxine  (SYNTHROID ) 125 MCG tablet TAKE 1 TABLET (125 MCG TOTAL) BY MOUTH DAILY. PT NEEDS APT FOR ADDITIONAL REFILLS 10/09/22   Bevin Bernice RAMAN, DO  medroxyPROGESTERone  (PROVERA ) 10 MG tablet TAKE 1  TABLET BY MOUTH EVERY DAY FOR 10 DAYS PER MONTH 04/05/20   Marsa Edelman, DO  Multiple Vitamin (MULTIVITAMIN) tablet Take 1 tablet by mouth daily.      [provider]  naratriptan  (AMERGE) 2.5 MG tablet TAKE 1 TABLET AS NEEDED MIGRAINE MAY REPEAT AFTER 2 HOURS MAX 2 PER 24 HOURS. 06/22/23   Lomax, Amy, NP  nortriptyline  (PAMELOR ) 50 MG capsule Take 1 capsule (50 mg total) by mouth at bedtime. 06/22/23   Lomax, Amy, NP  omeprazole  (PRILOSEC) 40 MG capsule TAKE 1 CAPSULE BY MOUTH EVERY DAY 03/01/20   Alexander, Natalie, DO  ondansetron  (ZOFRAN -ODT) 8 MG disintegrating tablet Take 1 tablet (8 mg total) by mouth every 8 (eight) hours as needed (prior to meals to rpevent diarrhea). 03/10/18   Alexander, Natalie, DO  promethazine  (PHENERGAN ) 12.5 MG tablet Take 1 tablet (12.5 mg total) by mouth every 6 (six) hours as needed for nausea or vomiting. 04/25/21   Ines Onetha NOVAK, MD  propranolol  ER (INDERAL  LA) 60 MG 24 hr capsule Take 1 capsule (60 mg total) by mouth daily. 06/22/23   Lomax, Amy, NP  tizanidine  (ZANAFLEX ) 2 MG capsule Take 1 capsule (2 mg total) by mouth 3 (three) times daily as needed for muscle spasms. 06/22/23   Lomax, Amy, NP    Allergies: Tape and Prednisone     Review of Systems  Genitourinary:  Positive for vaginal bleeding.    Updated Vital  Signs BP 128/81   Pulse (!) 107   Temp (!) 97.2 F (36.2 C)   Resp (!) 21   LMP 09/29/2023   SpO2 100%   Physical Exam Vitals and nursing note reviewed.  Constitutional:      Appearance: She is not toxic-appearing.   Cardiovascular:     Rate and Rhythm: Tachycardia present.  Pulmonary:     Effort: Pulmonary effort is normal.  Abdominal:     General: Abdomen is flat.     Palpations: Abdomen is soft.   Musculoskeletal:        General: Normal range of motion.     Cervical back: Normal range of motion.   Neurological:     Mental Status: She is alert.     (all labs ordered are listed, but only abnormal results are  displayed) Labs Reviewed  CBC - Abnormal; Notable for the following components:      Result Value   RBC 2.79 (*)    Hemoglobin 6.1 (*)    HCT 19.8 (*)    MCV 71.0 (*)    MCH 21.9 (*)    RDW 21.3 (*)    All other components within normal limits  BASIC METABOLIC PANEL WITH GFR - Abnormal; Notable for the following components:   CO2 21 (*)    Glucose, Bld 120 (*)    Calcium  8.5 (*)    All other components within normal limits  HCG, SERUM, QUALITATIVE  PRO BRAIN NATRIURETIC PEPTIDE    EKG: None  Radiology: DG Chest Portable 1 View Result Date: 10/17/2023 CLINICAL DATA:  Dyspnea and dizziness. Patient has been on her. Four 2 weeks with progressively heavier bleeding EXAM: PORTABLE CHEST 1 VIEW COMPARISON:  03/15/2012. FINDINGS: The heart size and mediastinal contours are within normal limits. Both lungs are clear. The visualized skeletal structures are unremarkable. IMPRESSION: No active disease. Electronically Signed   By: Waddell Calk M.D.   On: 10/17/2023 13:53   US  Pelvis Complete Result Date: 10/17/2023 CLINICAL DATA:  Heavy vaginal bleeding 2 weeks. Previous bilateral oophorectomy at age 58 due to Turner syndrome. Patient on estrogen and progesterone . EXAM: TRANSABDOMINAL AND TRANSVAGINAL ULTRASOUND OF PELVIS TECHNIQUE: Both transabdominal and transvaginal ultrasound examinations of the pelvis were performed. Transabdominal technique was performed for global imaging of the pelvis including uterus, ovaries, adnexal regions, and pelvic cul-de-sac. It was necessary to proceed with endovaginal exam following the transabdominal exam to visualize the endometrium. COMPARISON:  None Available. FINDINGS: Uterus Uterus is retroverted. Measurements: 7.2 x 4.7 x 5.2 cm = volume: 81 mL. No fibroids or other mass visualized. Endometrium Thickness: 19 mm. Endometrium is thickened and somewhat heterogeneous. Oval masslike area along the right side of the fundal endometrium measuring 1.1 x 1.6 x 1.7  cm. Several nabothian cysts over the cervix. Right ovary Previous oophorectomy. Left ovary Previous oophorectomy. Other findings No abnormal free fluid. IMPRESSION: 1. Thickened heterogeneous endometrium measuring 19 mm. Oval masslike area along the right side of the fundal endometrium measuring 1.7 cm. Differential considerations include endometrial hyperplasia, polyp, submucosal fibroid and carcinoma. Recommend OBGYN consultation for consideration of tissue sampling. 2. Previous bilateral oophorectomy. Electronically Signed   By: Toribio Agreste M.D.   On: 10/17/2023 13:47   US  Transvaginal Non-OB Result Date: 10/17/2023 CLINICAL DATA:  Heavy vaginal bleeding 2 weeks. Previous bilateral oophorectomy at age 26 due to Turner syndrome. Patient on estrogen and progesterone . EXAM: TRANSABDOMINAL AND TRANSVAGINAL ULTRASOUND OF PELVIS TECHNIQUE: Both transabdominal and transvaginal ultrasound examinations of the pelvis  were performed. Transabdominal technique was performed for global imaging of the pelvis including uterus, ovaries, adnexal regions, and pelvic cul-de-sac. It was necessary to proceed with endovaginal exam following the transabdominal exam to visualize the endometrium. COMPARISON:  None Available. FINDINGS: Uterus Uterus is retroverted. Measurements: 7.2 x 4.7 x 5.2 cm = volume: 81 mL. No fibroids or other mass visualized. Endometrium Thickness: 19 mm. Endometrium is thickened and somewhat heterogeneous. Oval masslike area along the right side of the fundal endometrium measuring 1.1 x 1.6 x 1.7 cm. Several nabothian cysts over the cervix. Right ovary Previous oophorectomy. Left ovary Previous oophorectomy. Other findings No abnormal free fluid. IMPRESSION: 1. Thickened heterogeneous endometrium measuring 19 mm. Oval masslike area along the right side of the fundal endometrium measuring 1.7 cm. Differential considerations include endometrial hyperplasia, polyp, submucosal fibroid and carcinoma. Recommend  OBGYN consultation for consideration of tissue sampling. 2. Previous bilateral oophorectomy. Electronically Signed   By: Toribio Agreste M.D.   On: 10/17/2023 13:47     .Critical Care  Performed by: Mannie Fairy DASEN, DO Authorized by: Mannie Fairy DASEN, DO   Critical care provider statement:    Critical care time (minutes):  33   Critical care was necessary to treat or prevent imminent or life-threatening deterioration of the following conditions:  Shock   Critical care was time spent personally by me on the following activities:  Blood draw for specimens, discussions with consultants, examination of patient and review of old charts    Medications Ordered in the ED  medroxyPROGESTERone  (PROVERA ) tablet 20 mg (20 mg Oral Given 10/17/23 1318)  lactated ringers bolus 1,000 mL (0 mLs Intravenous Stopped 10/17/23 1347)  tranexamic acid (CYKLOKAPRON) injection 1,000 mg (1,000 mg Intravenous Given 10/17/23 1319)  ondansetron  (ZOFRAN ) injection 4 mg (4 mg Intravenous Given 10/17/23 1347)                                    Medical Decision Making 38 year old female here today for vaginal bleeding, shortness of breath and lightheadedness.  Plan-patient with symptomatic anemia.  Hemoglobin 6.1.  At my freestanding ED we do not have type and screens nor blood products to transfuse.  Fortunately, patient is hemodynamically stable at this time.  Likely is taking her some time to get to this point.  Will give the patient TXA and Provera .  Have ordered ultrasound.  Will work patient up for high-output heart failure though I think this is less likely.  Reassessment 1:55 PM-pulm examined the patient normal.  I did not appreciate any bleeding from the cervical os.  Patient still pending results of transvaginal ultrasound.  BNP negative, no pulmonary edema per my independent review on chest x-ray.  Spoke with Dr. Mercie who agreed with ED to ED transfer.  Accepting ED physician is Dr. Pamella.  When patient  attempted to ambulate to the bathroom, she was too lightheaded and dizzy.  Amount and/or Complexity of Data Reviewed Labs: ordered. Radiology: ordered.  Risk Prescription drug management.        Final diagnoses:  Vaginal bleeding    ED Discharge Orders     None          Mannie Fairy DASEN, DO 10/17/23 1403    Mannie Fairy T, DO 10/17/23 1444

## 2023-10-17 NOTE — ED Notes (Signed)
 Attempted to call report to [facility] ED charge nurse.  No answer

## 2023-10-17 NOTE — ED Notes (Signed)
 Pt to er, per [facility] for vaginal bleeding for the past two week, pt is pale and reports a slight headache, states that she has a hx of headaches.  MD at bedside.

## 2023-10-17 NOTE — ED Notes (Signed)
 Called [facility] for transport to [facility] ED

## 2023-10-18 DIAGNOSIS — E039 Hypothyroidism, unspecified: Secondary | ICD-10-CM

## 2023-10-18 DIAGNOSIS — N920 Excessive and frequent menstruation with regular cycle: Secondary | ICD-10-CM

## 2023-10-18 DIAGNOSIS — D62 Acute posthemorrhagic anemia: Secondary | ICD-10-CM | POA: Diagnosis not present

## 2023-10-18 LAB — CBC WITH DIFFERENTIAL/PLATELET
Abs Immature Granulocytes: 0.07 10*3/uL (ref 0.00–0.07)
Basophils Absolute: 0 10*3/uL (ref 0.0–0.1)
Basophils Relative: 0 %
Eosinophils Absolute: 0.1 10*3/uL (ref 0.0–0.5)
Eosinophils Relative: 1 %
HCT: 27 % — ABNORMAL LOW (ref 36.0–46.0)
Hemoglobin: 8.6 g/dL — ABNORMAL LOW (ref 12.0–15.0)
Immature Granulocytes: 1 %
Lymphocytes Relative: 27 %
Lymphs Abs: 2 10*3/uL (ref 0.7–4.0)
MCH: 24.4 pg — ABNORMAL LOW (ref 26.0–34.0)
MCHC: 31.9 g/dL (ref 30.0–36.0)
MCV: 76.5 fL — ABNORMAL LOW (ref 80.0–100.0)
Monocytes Absolute: 0.7 10*3/uL (ref 0.1–1.0)
Monocytes Relative: 9 %
Neutro Abs: 4.5 10*3/uL (ref 1.7–7.7)
Neutrophils Relative %: 62 %
Platelets: 252 10*3/uL (ref 150–400)
RBC: 3.53 MIL/uL — ABNORMAL LOW (ref 3.87–5.11)
RDW: 20.6 % — ABNORMAL HIGH (ref 11.5–15.5)
WBC: 7.3 10*3/uL (ref 4.0–10.5)
nRBC: 0 % (ref 0.0–0.2)

## 2023-10-18 LAB — TYPE AND SCREEN
ABO/RH(D): O POS
Antibody Screen: NEGATIVE
Unit division: 0
Unit division: 0

## 2023-10-18 LAB — BPAM RBC
Blood Product Expiration Date: 202507282359
Blood Product Expiration Date: 202507282359
ISSUE DATE / TIME: 202506281818
ISSUE DATE / TIME: 202506282205
Unit Type and Rh: 5100
Unit Type and Rh: 5100

## 2023-10-18 LAB — COMPREHENSIVE METABOLIC PANEL WITH GFR
ALT: 22 U/L (ref 0–44)
AST: 22 U/L (ref 15–41)
Albumin: 3.2 g/dL — ABNORMAL LOW (ref 3.5–5.0)
Alkaline Phosphatase: 48 U/L (ref 38–126)
Anion gap: 9 (ref 5–15)
BUN: 6 mg/dL (ref 6–20)
CO2: 23 mmol/L (ref 22–32)
Calcium: 8.2 mg/dL — ABNORMAL LOW (ref 8.9–10.3)
Chloride: 107 mmol/L (ref 98–111)
Creatinine, Ser: 0.99 mg/dL (ref 0.44–1.00)
GFR, Estimated: >60 mL/min (ref >60)
Glucose, Bld: 110 mg/dL — ABNORMAL HIGH (ref 70–99)
Potassium: 3.2 mmol/L — ABNORMAL LOW (ref 3.5–5.1)
Sodium: 139 mmol/L (ref 135–145)
Total Bilirubin: 0.7 mg/dL (ref 0.0–1.2)
Total Protein: 5.6 g/dL — ABNORMAL LOW (ref 6.5–8.1)

## 2023-10-18 MED ORDER — MEGESTROL ACETATE 40 MG PO TABS: 40.0000 mg | ORAL_TABLET | Freq: Every day | ORAL | 0 refills | Status: DC

## 2023-10-18 MED ORDER — LEVOTHYROXINE SODIUM 125 MCG PO TABS: 125.0000 ug | ORAL_TABLET | Freq: Every day | ORAL | 2 refills | Status: DC

## 2023-10-18 MED ORDER — FERROUS SULFATE 325 (65 FE) MG PO TABS: 325.0000 mg | ORAL_TABLET | Freq: Every day | ORAL | 0 refills | Status: DC

## 2023-10-18 NOTE — Plan of Care (Signed)
   Problem: Activity: Goal: Risk for activity intolerance will decrease Outcome: Progressing

## 2023-10-18 NOTE — Discharge Summary (Signed)
 Physician Discharge Summary   Patient: Cristina Zimmerman MRN: [id number] DOB: 1986/03/26  Admit date:     10/17/2023  Discharge date: 10/18/23  Discharge Physician: Carliss LELON Canales   PCP: Bevin Bernice RAMAN, DO (Inactive)   Recommendations at discharge:    Pt to be discharged home.   If you experience worsening fever, chills, chest pain, shortness of breath, or other concerning symptoms, please call your PCP or go to the emergency department immediately.  Discharge Diagnoses: Principal Problem:   ABLA (acute blood loss anemia) Active Problems:   Anxiety   Hypertension   Childhood asthma   Hypothyroidism   Depression   Acute blood loss anemia (ABLA)   Menorrhagia with regular cycle  Resolved Problems:   * No resolved hospital problems. *   Hospital Course:  38 y.o. 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  Patient also reports some fatigue and shortness of breath.  She reports that she has been having menorrhagia every day for 40 years now and is fatigued and dizzy.  No reports of chest pain falls or any other complaints.  Patient went to [facility] today and was given p.o. medroxyprogesterone .    Assessment and Plan:  Acute blood loss anemia - Hemoglobin 6.1 on presentation.  Secondary to menometrorrhagia.  Status post 2 units packed RBCs.  Iron  studies noting severe iron  deficiency.  Will provide refills of p.o. iron  supplementation to take daily.  Recommend patient follow-up with OB/GYN in the outpatient setting.  May be appropriate for IV iron  supplementation.  Increased menorrhagia - Evaluated by OB/[facility]ynn in ED.  Given 300 mg medroxyprogesterone  x 1 with recommendations for megestrol 40 mg daily thereafter.  Patient to follow-up with outpatient appointment to reevaluate  hormone therapy.  Hypothyroidism - TSH noted to be greater than 11.  Reportedly had been taking her reduced prescription.  Will give refill of Synthroid  125 mcg to take daily.  Recommend repeat TSH in 6-8 weeks.  Consultants: None Procedures performed: None Disposition: Home Diet recommendation:  Discharge Diet Orders (From admission, onward)     Start     Ordered   10/18/23 0000  Diet - low sodium heart healthy        10/18/23 0947           Regular diet  DISCHARGE MEDICATION: Allergies as of 10/18/2023       Reactions   Tape Other (See Comments)   Skin irritation, redness   Deltasone  [prednisone ] Anxiety, Other (See Comments), Hypertension   Jittery Reaction is mainly to tablet formulations Pt does okay with steroid injections        Medication List     TAKE these medications    albuterol  108 (90 Base) MCG/ACT inhaler Commonly known as: VENTOLIN  HFA Inhale 2 puffs into the lungs every 6 (six) hours as needed for wheezing.   amLODipine  5 MG tablet Commonly known as: NORVASC  Take 1 tablet (5 mg total) by mouth every other day.   b complex vitamins capsule Take 1 capsule by mouth daily after breakfast.   Botox  200 units injection Generic drug: botulinum toxin Type A  Provider to inject 155 units into the muscles of the head and neck every 12 weeks. Discard remainder   estradiol  0.075 MG/24HR Commonly known as: VIVELLE -DOT Place 1 patch onto the  skin once a week. What changed: when to take this   ferrous sulfate 325 (65 FE) MG tablet Take 1 tablet (325 mg total) by mouth daily.   ibuprofen 200 MG tablet Commonly known as: ADVIL Take 400 mg by mouth 2 (two) times daily as needed for headache, moderate pain (pain score 4-6) or cramping.   levothyroxine  125 MCG tablet Commonly known as: SYNTHROID  Take 1 tablet (125 mcg total) by mouth daily. PT NEEDS APT FOR ADDITIONAL REFILLS What changed: Another medication with the same name was removed. Continue  taking this medication, and follow the directions you see here.   megestrol 40 MG tablet Commonly known as: MEGACE Take 1 tablet (40 mg total) by mouth daily. Start taking on: October 19, 2023   Multivitamin Women Tabs Take 1 tablet by mouth daily with lunch.   naratriptan  2.5 MG tablet Commonly known as: AMERGE TAKE 1 TABLET AS NEEDED MIGRAINE MAY REPEAT AFTER 2 HOURS MAX 2 PER 24 HOURS.   nortriptyline  50 MG capsule Commonly known as: PAMELOR  Take 1 capsule (50 mg total) by mouth at bedtime.   omeprazole  20 MG tablet Commonly known as: PRILOSEC OTC Take 20 mg by mouth at bedtime.   promethazine  12.5 MG tablet Commonly known as: PHENERGAN  Take 1 tablet (12.5 mg total) by mouth every 6 (six) hours as needed for nausea or vomiting.   propranolol  ER 60 MG 24 hr capsule Commonly known as: INDERAL  LA Take 1 capsule (60 mg total) by mouth daily. What changed: when to take this   tizanidine  2 MG capsule Commonly known as: ZANAFLEX  Take 1 capsule (2 mg total) by mouth 3 (three) times daily as needed for muscle spasms.         Discharge Exam: Filed Weights   10/18/23 0205  Weight: 80.6 kg    GENERAL:  Alert, pleasant, no acute distress  HEENT:  EOMI CARDIOVASCULAR:  RRR, no murmurs appreciated RESPIRATORY:  Clear to auscultation, no wheezing, rales, or rhonchi GASTROINTESTINAL:  Soft, nontender, nondistended EXTREMITIES:  No LE edema bilaterally NEURO:  No new focal deficits appreciated SKIN:  No rashes noted PSYCH:  Appropriate mood and affect     Condition at discharge: improving  The results of significant diagnostics from this hospitalization (including imaging, microbiology, ancillary and laboratory) are listed below for reference.   Imaging Studies: DG Chest Portable 1 View Result Date: 10/17/2023 CLINICAL DATA:  Dyspnea and dizziness. Patient has been on her. Four 2 weeks with progressively heavier bleeding EXAM: PORTABLE CHEST 1 VIEW COMPARISON:   03/15/2012. FINDINGS: The heart size and mediastinal contours are within normal limits. Both lungs are clear. The visualized skeletal structures are unremarkable. IMPRESSION: No active disease. Electronically Signed   By: Waddell Calk M.D.   On: 10/17/2023 13:53   US  Pelvis Complete Result Date: 10/17/2023 CLINICAL DATA:  Heavy vaginal bleeding 2 weeks. Previous bilateral oophorectomy at age 86 due to Turner syndrome. Patient on estrogen and progesterone . EXAM: TRANSABDOMINAL AND TRANSVAGINAL ULTRASOUND OF PELVIS TECHNIQUE: Both transabdominal and transvaginal ultrasound examinations of the pelvis were performed. Transabdominal technique was performed for global imaging of the pelvis including uterus, ovaries, adnexal regions, and pelvic cul-de-sac. It was necessary to proceed with endovaginal exam following the transabdominal exam to visualize the endometrium. COMPARISON:  None Available. FINDINGS: Uterus Uterus is retroverted. Measurements: 7.2 x 4.7 x 5.2 cm = volume: 81 mL. No fibroids or other mass visualized. Endometrium Thickness: 19 mm. Endometrium is thickened and somewhat heterogeneous. Oval masslike area along  the right side of the fundal endometrium measuring 1.1 x 1.6 x 1.7 cm. Several nabothian cysts over the cervix. Right ovary Previous oophorectomy. Left ovary Previous oophorectomy. Other findings No abnormal free fluid. IMPRESSION: 1. Thickened heterogeneous endometrium measuring 19 mm. Oval masslike area along the right side of the fundal endometrium measuring 1.7 cm. Differential considerations include endometrial hyperplasia, polyp, submucosal fibroid and carcinoma. Recommend OBGYN consultation for consideration of tissue sampling. 2. Previous bilateral oophorectomy. Electronically Signed   By: Toribio Agreste M.D.   On: 10/17/2023 13:47   US  Transvaginal Non-OB Result Date: 10/17/2023 CLINICAL DATA:  Heavy vaginal bleeding 2 weeks. Previous bilateral oophorectomy at age 41 due to Turner  syndrome. Patient on estrogen and progesterone . EXAM: TRANSABDOMINAL AND TRANSVAGINAL ULTRASOUND OF PELVIS TECHNIQUE: Both transabdominal and transvaginal ultrasound examinations of the pelvis were performed. Transabdominal technique was performed for global imaging of the pelvis including uterus, ovaries, adnexal regions, and pelvic cul-de-sac. It was necessary to proceed with endovaginal exam following the transabdominal exam to visualize the endometrium. COMPARISON:  None Available. FINDINGS: Uterus Uterus is retroverted. Measurements: 7.2 x 4.7 x 5.2 cm = volume: 81 mL. No fibroids or other mass visualized. Endometrium Thickness: 19 mm. Endometrium is thickened and somewhat heterogeneous. Oval masslike area along the right side of the fundal endometrium measuring 1.1 x 1.6 x 1.7 cm. Several nabothian cysts over the cervix. Right ovary Previous oophorectomy. Left ovary Previous oophorectomy. Other findings No abnormal free fluid. IMPRESSION: 1. Thickened heterogeneous endometrium measuring 19 mm. Oval masslike area along the right side of the fundal endometrium measuring 1.7 cm. Differential considerations include endometrial hyperplasia, polyp, submucosal fibroid and carcinoma. Recommend OBGYN consultation for consideration of tissue sampling. 2. Previous bilateral oophorectomy. Electronically Signed   By: Toribio Agreste M.D.   On: 10/17/2023 13:47    Microbiology: Results for orders placed or performed in visit on 04/23/11  CULTURE, URINE COMPREHENSIVE     Status: None   Collection Time: 04/23/11  3:29 PM   Specimen: Urine  Result Value Ref Range Status   Culture ESCHERICHIA COLI  Final   Colony Count 40,000 COLONIES/ML  Final   Organism ID, Bacteria ESCHERICHIA COLI  Final      Susceptibility   Escherichia coli -  (no method available)    AMPICILLIN >=32 Resistant     AMPICILLIN/SULBACTAM 16 Intermediate     IMIPENEM <=1 Sensitive     CEFAZOLIN <=4 Sensitive     CEFOXITIN <=4 Sensitive      CEFTRIAXONE  <=1 Sensitive     CEFTAZIDIME <=1 Sensitive     CEFEPIME <=1 Sensitive     AMIKACIN <=2 Sensitive     GENTAMICIN <=1 Sensitive     TOBRAMYCIN <=1 Sensitive     CIPROFLOXACIN  >=4 Resistant     LEVOFLOXACIN >=8 Resistant     NITROFURANTOIN  <=16 Sensitive     TRIMETH/SULFA <=20 Sensitive     Labs: CBC: Recent Labs  Lab 10/17/23 1141 10/18/23 0819  WBC 7.5 7.3  NEUTROABS  --  4.5  HGB 6.1* 8.6*  HCT 19.8* 27.0*  MCV 71.0* 76.5*  PLT 288 252   Basic Metabolic Panel: Recent Labs  Lab 10/17/23 1141  NA 135  K 4.3  CL 102  CO2 21*  GLUCOSE 120*  BUN 13  CREATININE 0.96  CALCIUM  8.5*   Liver Function Tests: Recent Labs  Lab 10/17/23 2004  AST 23  ALT 24  ALKPHOS 51  BILITOT 0.6  PROT 6.1*  ALBUMIN 3.5  CBG: No results for input(s): GLUCAP in the last 168 hours.  Discharge time spent: 25 minutes.  Length of inpatient stay: 0 days  Signed: Carliss LELON Canales, DO [facility] 10/18/2023

## 2023-12-03 ENCOUNTER — Ambulatory Visit (INDEPENDENT_AMBULATORY_CARE_PROVIDER_SITE_OTHER): Admitting: Urgent Care

## 2023-12-03 ENCOUNTER — Encounter: Payer: Self-pay | Admitting: Urgent Care

## 2023-12-03 VITALS — BP 104/73 | HR 94 | Resp 20 | Ht 62.0 in | Wt 172.2 lb

## 2023-12-03 DIAGNOSIS — E876 Hypokalemia: Secondary | ICD-10-CM

## 2023-12-03 DIAGNOSIS — D500 Iron deficiency anemia secondary to blood loss (chronic): Secondary | ICD-10-CM | POA: Insufficient documentation

## 2023-12-03 DIAGNOSIS — N8500 Endometrial hyperplasia, unspecified: Secondary | ICD-10-CM

## 2023-12-03 DIAGNOSIS — N920 Excessive and frequent menstruation with regular cycle: Secondary | ICD-10-CM | POA: Diagnosis not present

## 2023-12-03 DIAGNOSIS — E039 Hypothyroidism, unspecified: Secondary | ICD-10-CM

## 2023-12-03 DIAGNOSIS — Q969 Turner's syndrome, unspecified: Secondary | ICD-10-CM | POA: Diagnosis not present

## 2023-12-03 NOTE — Progress Notes (Signed)
 New Patient Office Visit  Subjective:  Patient ID: Cristina Zimmerman, female    DOB: Sep 05, 1985  Age: 38 y.o. MRN: [id number]  CC:  Chief Complaint  Patient presents with   Transitions Of Care    HFU pt also states here amlodipine makes her dizzy    HPI Cristina Zimmerman presents to establish care  Discussed the use of AI scribe software for clinical note transcription with the patient, who gave verbal consent to proceed.  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  She recently experienced heavy menstrual bleeding, fatigue, and dizziness, leading to a hospital admission on July 28th. She was discharged the following day after severe symptoms including pallor, inability to walk straight, and near syncope. She received a blood transfusion during her hospital stay.  She has a history of Turner syndrome and underwent an oophorectomy at age 50, leading to hormone replacement therapy. Her menstrual cycles have been heavy, but recently they have become excessively so, prompting multiple ER visits. An internal ultrasound at [facility] showed endometrial hyperplasia and a biopsy is recommended. She has been on estradiol, and her dosage was adjusted by women's health. She also received a Depo shot during her last ER visit, which was intended to last three months, but she began experiencing breakthrough bleeding a week ago.  She experiences fatigue and dizziness, with her hemoglobin noted to be low at 8. She has been on iron supplements but has had difficulty with the dosage due to gastrointestinal side effects. She switched to Vitron C, which she tolerates better. She also has a history of migraines, which she suspects are related to her hormonal changes.  Her family history includes her mother being diagnosed with non-alcoholic cirrhosis due to fatty liver, which is a concern for  her as she has been diagnosed with fatty liver as well. She is on transdermal estradiol due to liver concerns and has noted improvement in her liver function tests since switching to this form.  She is also on treatment for hypothyroidism with levothyroxine, but has had difficulty maintaining a consistent schedule for taking it. She was off her medication prior to her hospital visit, which may have contributed to her elevated thyroid levels at that time.       Outpatient Encounter Medications as of 12/03/2023  Medication Sig   estradiol (VIVELLE-DOT) 0.075 MG/24HR Place 1 patch onto the skin once a week. (Patient taking differently: Place 1 patch onto the skin every Wednesday.)   albuterol (PROVENTIL HFA;VENTOLIN HFA) 108 (90 Base) MCG/ACT inhaler Inhale 2 puffs into the lungs every 6 (six) hours as needed for wheezing.   amLODipine (NORVASC) 5 MG tablet Take 1 tablet (5 mg total) by mouth every other day. (Patient not taking: Reported on 12/03/2023)   b complex vitamins capsule Take 1 capsule by mouth daily after breakfast.   botulinum toxin Type A (BOTOX) 200 units injection Provider to inject 155 units into the muscles of the head and neck every 12 weeks. Discard remainder   ferrous sulfate 325 (65 FE) MG tablet Take 1 tablet (325 mg total) by mouth daily.   ibuprofen (ADVIL) 200 MG tablet Take 400 mg by mouth 2 (two) times daily as needed for headache, moderate pain (pain score 4-6) or cramping.   levothyroxine (SYNTHROID) 125 MCG tablet Take 1 tablet (125 mcg total) by mouth daily. PT NEEDS APT  FOR ADDITIONAL REFILLS   megestrol  (MEGACE ) 40 MG tablet Take 1 tablet (40 mg total) by mouth daily. (Patient not taking: Reported on 12/03/2023)   Multiple Vitamins-Minerals (MULTIVITAMIN WOMEN) TABS Take 1 tablet by mouth daily with lunch.   naratriptan  (AMERGE) 2.5 MG tablet TAKE 1 TABLET AS NEEDED MIGRAINE MAY REPEAT AFTER 2 HOURS MAX 2 PER 24 HOURS.   nortriptyline  (PAMELOR ) 50 MG capsule Take 1  capsule (50 mg total) by mouth at bedtime.   omeprazole  (PRILOSEC OTC) 20 MG tablet Take 20 mg by mouth at bedtime.   promethazine  (PHENERGAN ) 12.5 MG tablet Take 1 tablet (12.5 mg total) by mouth every 6 (six) hours as needed for nausea or vomiting.   propranolol  ER (INDERAL  LA) 60 MG 24 hr capsule Take 1 capsule (60 mg total) by mouth daily. (Patient taking differently: Take 60 mg by mouth at bedtime.)   tizanidine  (ZANAFLEX ) 2 MG capsule Take 1 capsule (2 mg total) by mouth 3 (three) times daily as needed for muscle spasms.   No facility-administered encounter medications on file as of 12/03/2023.    Past Medical History:  Diagnosis Date   Allergy    Anxiety    Hematuria    Hip dysplasia    Hyperlipidemia    Hypertension    Migraine    NASH (nonalcoholic steatohepatitis)    Osteoporosis    Scoliosis    minimal   Thyroid  disease    Turner syndrome    Vitamin D  deficiency     Past Surgical History:  Procedure Laterality Date   BILATERAL OOPHORECTOMY     HERNIA REPAIR     x2   HIP SURGERY     KYPHOPLASTY  08/2016   L2   TONSILLECTOMY     TYMPANOSTOMY TUBE PLACEMENT      Family History  Problem Relation Age of Onset   Hypertension Mother    Arthritis Mother    Fibromyalgia Mother    Hyperlipidemia Mother    Migraines Mother    Diabetes Father    Alcohol abuse Maternal Grandfather    Cancer Maternal Grandfather        BRAIN   Diabetes Paternal Grandmother    Stroke Paternal Grandmother    Kidney disease Paternal Grandmother    Heart attack Paternal Grandmother    Hypertension Paternal Grandmother    Hyperlipidemia Paternal Grandmother    Diabetes Paternal Grandfather     Social History   Socioeconomic History   Marital status: Single    Spouse name: Not on file   Number of children: Not on file   Years of education: Not on file   Highest education level: Some college, no degree  Occupational History   Not on file  Tobacco Use   Smoking status: Never    Smokeless tobacco: Never  Vaping Use   Vaping status: Never Used  Substance and Sexual Activity   Alcohol use: Not Currently    Comment: socially   Drug use: No   Sexual activity: Yes    Birth control/protection: Patch  Other Topics Concern   Not on file  Social History Narrative   Lives alone    Caffeine use: 2 cups daily   Pt works    Social Drivers of Corporate investment banker Strain: Not on file  Food Insecurity: No Food Insecurity (10/18/2023)   Hunger Vital Sign    Worried About Running Out of Food in the Last Year: Never true    Ran Out of  Food in the Last Year: Never true  Transportation Needs: No Transportation Needs (10/18/2023)   PRAPARE - Administrator, Civil Service (Medical): No    Lack of Transportation (Non-Medical): No  Physical Activity: Not on file  Stress: Not on file  Social Connections: Socially Isolated (10/18/2023)   Social Connection and Isolation Panel    Frequency of Communication with Friends and Family: More than three times a week    Frequency of Social Gatherings with Friends and Family: More than three times a week    Attends Religious Services: Never    Database administrator or Organizations: No    Attends Banker Meetings: Never    Marital Status: Never married  Intimate Partner Violence: Not At Risk (10/18/2023)   Humiliation, Afraid, Rape, and Kick questionnaire    Fear of Current or Ex-Partner: No    Emotionally Abused: No    Physically Abused: No    Sexually Abused: No    ROS: as noted in HPI  Objective:  BP 104/73   Pulse 94   Resp 20   Ht 5' 2 (1.575 m)   Wt 172 lb 4 oz (78.1 kg)   SpO2 100%   BMI 31.50 kg/m   Physical Exam Vitals and nursing note reviewed.  Constitutional:      General: She is not in acute distress.    Appearance: Normal appearance. She is not ill-appearing, toxic-appearing or diaphoretic.  HENT:     Head: Normocephalic and atraumatic.  Eyes:     General: No scleral  icterus.       Right eye: No discharge.        Left eye: No discharge.     Extraocular Movements: Extraocular movements intact.     Pupils: Pupils are equal, round, and reactive to light.  Cardiovascular:     Rate and Rhythm: Normal rate and regular rhythm.  Pulmonary:     Effort: Pulmonary effort is normal. No respiratory distress.     Breath sounds: Normal breath sounds. No stridor. No wheezing or rhonchi.  Abdominal:     Palpations: Abdomen is soft.  Musculoskeletal:     Cervical back: Normal range of motion and neck supple. No rigidity or tenderness.  Lymphadenopathy:     Cervical: No cervical adenopathy.  Skin:    General: Skin is warm and dry.     Coloration: Skin is not jaundiced.     Findings: No bruising, erythema or rash.  Neurological:     General: No focal deficit present.     Mental Status: She is alert and oriented to person, place, and time.     Gait: Gait normal.  Psychiatric:        Mood and Affect: Mood normal.        Behavior: Behavior normal.      Assessment & Plan:  Iron deficiency anemia due to chronic blood loss -     CBC with Differential/Platelet -     Iron, TIBC and Ferritin Panel -     CMP14+EGFR -     Von Willebrand panel  Menorrhagia with regular cycle -     CBC with Differential/Platelet -     Iron, TIBC and Ferritin Panel -     CMP14+EGFR -     Von Willebrand panel  Endometrial hyperplasia  Turner syndrome  Acquired hypothyroidism -     TSH + free T4  Hypokalemia -     CMP14+EGFR -  Magnesium  Hypocalcemia  Assessment and Plan    Heavy menstrual bleeding with iron deficiency anemia and endometrial hyperplasia Chronic heavy menstrual bleeding causing iron deficiency anemia. Recent severe bleeding required hospitalization and transfusion. Endometrial hyperplasia identified, necessitating tissue sampling. Differential includes von Willebrand's disease due to easy bruising and prolonged bleeding. Symptoms include fatigue and  dizziness, with hemoglobin at 8. Recent Depo shot with breakthrough bleeding. Previous megace treatment and current Vitron C for iron supplementation. - Order von Willebrand's panel. - Repeat hemoglobin and other abnormal labs. - Discontinue ferrous sulfate and continue Vitron C for iron supplementation. - Schedule endometrial tissue sampling. - Communicate lab results and further plans through patient portal.  Hypothyroidism Non-compliance with levothyroxine due to timing with meals. Recent thyroid function tests elevated, likely from non-compliance. Discussed switching to Armour Thyroid for easier compliance. - Order repeat thyroid function tests. - Switch from levothyroxine to Armour Thyroid for easier compliance. - Monitor thyroid levels and adjust dosage as needed.  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 Current regimen includes transdermal estradiol due to liver concerns. History of fatty liver and family history of non-alcoholic cirrhosis. Hormonal fluctuations may contribute to migraines and menstrual irregularities. - Continue transdermal estradiol. - Monitor liver function tests regularly. - Discuss hormonal management with Women[facility].        Return in about 6 weeks (around 01/14/2024).   Benton LITTIE Gave, PA

## 2023-12-03 NOTE — Patient Instructions (Addendum)
 We drew your labs today. We will discuss treatment plan options tomorrow pending the results of your tests. Stop the ferrous sulfate and take the Vitron-C instead.  Keep your follow up with gynecology in Sept.  Please schedule a 6 week follow up with me.

## 2023-12-04 ENCOUNTER — Ambulatory Visit: Payer: Self-pay | Admitting: Urgent Care

## 2023-12-04 DIAGNOSIS — E039 Hypothyroidism, unspecified: Secondary | ICD-10-CM

## 2023-12-04 LAB — MAGNESIUM: Magnesium: 2.2 mg/dL (ref 1.6–2.3)

## 2023-12-04 MED ORDER — THYROID 60 MG PO TABS: 60.0000 mg | ORAL_TABLET | Freq: Every day | ORAL | 0 refills | Status: DC

## 2023-12-05 LAB — CBC WITH DIFFERENTIAL/PLATELET
Basophils Absolute: 0 x10E3/uL (ref 0.0–0.2)
Basos: 1 %
EOS (ABSOLUTE): 0.1 x10E3/uL (ref 0.0–0.4)
Eos: 2 %
Hematocrit: 35.3 % (ref 34.0–46.6)
Hemoglobin: 10.3 g/dL — ABNORMAL LOW (ref 11.1–15.9)
Immature Grans (Abs): 0.1 x10E3/uL (ref 0.0–0.1)
Immature Granulocytes: 1 %
Lymphocytes Absolute: 2.3 x10E3/uL (ref 0.7–3.1)
Lymphs: 32 %
MCH: 22.4 pg — ABNORMAL LOW (ref 26.6–33.0)
MCHC: 29.2 g/dL — ABNORMAL LOW (ref 31.5–35.7)
MCV: 77 fL — ABNORMAL LOW (ref 79–97)
Monocytes Absolute: 0.6 x10E3/uL (ref 0.1–0.9)
Monocytes: 8 %
Neutrophils Absolute: 4.2 x10E3/uL (ref 1.4–7.0)
Neutrophils: 56 %
Platelets: 351 x10E3/uL (ref 150–450)
RBC: 4.59 x10E6/uL (ref 3.77–5.28)
RDW: 17.4 % — ABNORMAL HIGH (ref 11.7–15.4)
WBC: 7.4 x10E3/uL (ref 3.4–10.8)

## 2023-12-05 LAB — IRON,TIBC AND FERRITIN PANEL
Ferritin: 8 ng/mL — ABNORMAL LOW (ref 15–150)
Iron Saturation: 4 % — CL (ref 15–55)
Iron: 19 ug/dL — ABNORMAL LOW (ref 27–159)
Total Iron Binding Capacity: 499 ug/dL — ABNORMAL HIGH (ref 250–450)
UIBC: 480 ug/dL — ABNORMAL HIGH (ref 131–425)

## 2023-12-05 LAB — CMP14+EGFR
ALT: 32 IU/L (ref 0–32)
AST: 24 IU/L (ref 0–40)
Albumin: 4.8 g/dL (ref 3.9–4.9)
Alkaline Phosphatase: 69 IU/L (ref 44–121)
BUN/Creatinine Ratio: 12 (ref 9–23)
BUN: 11 mg/dL (ref 6–20)
Bilirubin Total: 0.4 mg/dL (ref 0.0–1.2)
CO2: 18 mmol/L — ABNORMAL LOW (ref 20–29)
Calcium: 9.2 mg/dL (ref 8.7–10.2)
Chloride: 102 mmol/L (ref 96–106)
Creatinine, Ser: 0.92 mg/dL (ref 0.57–1.00)
Globulin, Total: 2.5 g/dL (ref 1.5–4.5)
Glucose: 89 mg/dL (ref 70–99)
Potassium: 4.3 mmol/L (ref 3.5–5.2)
Sodium: 136 mmol/L (ref 134–144)
Total Protein: 7.3 g/dL (ref 6.0–8.5)
eGFR: 81 mL/min/1.73 (ref >59)

## 2023-12-05 LAB — VON WILLEBRAND PANEL
Factor VIII Activity: 42 % — ABNORMAL LOW (ref 56–140)
Von Willebrand Ag: 57 % (ref 50–200)
Von Willebrand Factor: 59 % (ref 50–200)

## 2023-12-05 LAB — TSH+FREE T4
Free T4: 1.34 ng/dL (ref 0.82–1.77)
TSH: 11 u[IU]/mL — ABNORMAL HIGH (ref 0.450–4.500)

## 2023-12-05 LAB — COAG STUDIES INTERP REPORT

## 2023-12-14 ENCOUNTER — Telehealth: Payer: Self-pay | Admitting: *Deleted

## 2023-12-14 NOTE — Telephone Encounter (Signed)
 Left patient a message to call the office if she would be able to come in today at 1:15 or 1:55 PM for check-in. Patient requested an earlier appointment date if one became available with Dr. Cleatus.

## 2023-12-27 ENCOUNTER — Encounter: Payer: Self-pay | Admitting: Obstetrics and Gynecology

## 2023-12-27 NOTE — Progress Notes (Unsigned)
 GYNECOLOGY OFFICE VISIT NOTE  History:  Cristina Zimmerman is a 38 y.o. G0P0000 here today for HVB which occurred on 10/17/23. She was seen in the ED and was admitted for blood transfusion. Prior to the bleeding, she had an adjustment in her HRT with a reduction in her progesterone . She was having bleeding every 2 weeks. She received 2 units of blood. She was given Iron . She was given megace  to start (now completed) then reevaluate her HRT.  HRT had been managed by PCP.  Had been having issues and had E2 reduced. Issues were heavy bleeding for last 2 years. E2 patch is 0.075mg . Progesterone  is not normally Megace  - she was given this and Depo from the emergency room. Bleeding stopped for 3.5 weeks. Takes Progesterone  200 mg 10 days per month but doesn't always remember.   I reviewed her US  images myself. EL is 19 mm, 1.5 cm round structure that appears potentially most consistent with submucosal fibroid best seen on #1-3, Image 7/9.    Past Medical History:  Diagnosis Date   Allergy    Anxiety    Hematuria    Hip dysplasia    Hyperlipidemia    Hypertension    Migraine    NASH (nonalcoholic steatohepatitis)    Osteoporosis    Scoliosis    minimal   Thyroid  disease    Turner syndrome    Vitamin D  deficiency     Past Surgical History:  Procedure Laterality Date   BILATERAL OOPHORECTOMY     HERNIA REPAIR     x2   HIP SURGERY     KYPHOPLASTY  08/2016   L2   TONSILLECTOMY     TYMPANOSTOMY TUBE PLACEMENT      The following portions of the patient's history were reviewed and updated as appropriate: allergies, current medications, past family history, past medical history, past social history, past surgical history and problem list.   Health Maintenance:   Normal pap and negative HRHPV on 09/2022  Diagnosis  Date Value Ref Range Status  09/22/2022   Final   - Negative for intraepithelial lesion or malignancy (NILM)    Review of Systems:  Pertinent items noted in HPI and  remainder of comprehensive ROS otherwise negative.  Physical Exam:  BP 119/87   Pulse (!) 108   Ht 5' 2 (1.575 m)   Wt 169 lb (76.7 kg)   LMP  (LMP Unknown)   BMI 30.91 kg/m  CONSTITUTIONAL: Well-developed, well-nourished female in no acute distress.  HEENT:  Normocephalic, atraumatic. External right and left ear normal. No scleral icterus.  NECK: Normal range of motion, supple, no masses noted on observation SKIN: No rash noted. Not diaphoretic. No erythema. No pallor. MUSCULOSKELETAL: Normal range of motion. No edema noted. NEUROLOGIC: Alert and oriented to person, place, and time. Normal muscle tone coordination. No cranial nerve deficit noted. PSYCHIATRIC: Normal mood and affect. Normal behavior. Normal judgment and thought content.    GYNECOLOGY OFFICE PROCEDURE NOTE   Cristina Zimmerman is a 38 y.o. G0P0000 here for endometrial biopsy for AUB. Recent ultrasound also showed thickened EL.  Today, she reports no concerning symptoms. Of note, pap on 09/2022 was normal, negative HPV.   ENDOMETRIAL BIOPSY     The indications for endometrial biopsy were reviewed.   Risks of the biopsy including cramping, bleeding, infection, uterine perforation, inadequate specimen and need for additional procedures were discussed. Offered alternative of hysteroscopy, dilation and curettage in OR. The patient states she understands the R/B/I/A and  agrees to undergo procedure today. Urine pregnancy test was Not indicated. Consent was signed. Time out was performed.    Patient was positioned in dorsal lithotomy position. A vaginal speculum was placed.  The cervix was visualized and was prepped with Betadine.  A single-toothed tenaculum was placed on the anterior lip of the cervix to stabilize it. The 3 mm pipelle was easily introduced into the endometrial cavity without difficulty to a depth of 7 cm, and a Moderate amount of tissue was obtained after two passes and sent to pathology. The instruments were removed  from the patient's vagina. Minimal bleeding from the cervix was noted. The patient tolerated the procedure well.   Assessment and Plan:  1. Menorrhagia with regular cycle (Primary) EMB today Reviewed possible submucosal fibroid. I reviewed images with her and showed her the pictures.  Pending EMB would decide on best course of action I.e. D&C/Hysteroscopy/removal of fibroid.   2. Iron  deficiency anemia due to chronic blood loss PO iron   3. Turner syndrome S/p oopherectomy Continue HRT. Will simplify regimen.  Feels more irritation and agitation and poor sleep on the lower dose. Will increase back to usual dose especially with history of osteoposis and fracture. Prometrium  100 mg daily.   4. Vaginal dryness - Will do vaginal E2 for vaginal dryness. She has not used it before. Will do as Imvexxy . Appears well covered with her insurance.      Meds ordered this encounter  Medications   estradiol  (VIVELLE -DOT) 0.1 MG/24HR patch    Sig: Place 1 patch (0.1 mg total) onto the skin 2 (two) times a week.    Dispense:  8 patch    Refill:  12   progesterone  (PROMETRIUM ) 100 MG capsule    Sig: Take 1 capsule (100 mg total) by mouth daily.    Dispense:  90 capsule    Refill:  3   Estradiol  Starter Pack (IMVEXXY  STARTER PACK) 10 MCG INST    Sig: Place 1 Insert vaginally daily for 18 days.    Dispense:  18 each    Refill:  0    1 box please   Estradiol  (IMVEXXY  MAINTENANCE PACK) 10 MCG INST    Sig: Place 1 Insert vaginally 2 (two) times a week.    Dispense:  8 each    Refill:  12     Routine preventative health maintenance measures emphasized. Please refer to After Visit Summary for other counseling recommendations.   No follow-ups on file.  Vina Solian, MD, FACOG Obstetrician & Gynecologist, [facility] for [facility], [facility] & [facility]

## 2023-12-30 ENCOUNTER — Encounter: Payer: Self-pay | Admitting: Obstetrics and Gynecology

## 2023-12-30 ENCOUNTER — Other Ambulatory Visit (HOSPITAL_COMMUNITY)
Admission: RE | Admit: 2023-12-30 | Discharge: 2023-12-30 | Disposition: A | Discharge status: Home / Self Care | Source: Ambulatory Visit | Attending: Obstetrics and Gynecology | Admitting: Obstetrics and Gynecology

## 2023-12-30 ENCOUNTER — Ambulatory Visit (INDEPENDENT_AMBULATORY_CARE_PROVIDER_SITE_OTHER): Admitting: Obstetrics and Gynecology

## 2023-12-30 VITALS — BP 119/87 | HR 108 | Ht 62.0 in | Wt 169.0 lb

## 2023-12-30 DIAGNOSIS — Q969 Turner's syndrome, unspecified: Secondary | ICD-10-CM | POA: Diagnosis not present

## 2023-12-30 DIAGNOSIS — N920 Excessive and frequent menstruation with regular cycle: Secondary | ICD-10-CM

## 2023-12-30 DIAGNOSIS — N898 Other specified noninflammatory disorders of vagina: Secondary | ICD-10-CM

## 2023-12-30 DIAGNOSIS — D500 Iron deficiency anemia secondary to blood loss (chronic): Secondary | ICD-10-CM | POA: Diagnosis not present

## 2023-12-30 MED ORDER — PROGESTERONE MICRONIZED 100 MG PO CAPS: 100.0000 mg | ORAL_CAPSULE | Freq: Every day | ORAL | 3 refills | Status: AC

## 2023-12-30 MED ORDER — IMVEXXY STARTER PACK 10 MCG VA INST: 1.0000 | VAGINAL_INSERT | Freq: Every day | VAGINAL | 0 refills | Status: AC

## 2023-12-30 MED ORDER — IMVEXXY MAINTENANCE PACK 10 MCG VA INST: 1.0000 | VAGINAL_INSERT | VAGINAL | 12 refills | Status: AC

## 2023-12-30 MED ORDER — ESTRADIOL 0.1 MG/24HR TD PTTW: 1.0000 | MEDICATED_PATCH | TRANSDERMAL | 12 refills | Status: AC

## 2024-01-01 LAB — SURGICAL PATHOLOGY

## 2024-01-04 ENCOUNTER — Ambulatory Visit: Payer: Self-pay | Admitting: Obstetrics and Gynecology

## 2024-01-04 DIAGNOSIS — D500 Iron deficiency anemia secondary to blood loss (chronic): Secondary | ICD-10-CM

## 2024-01-04 DIAGNOSIS — N939 Abnormal uterine and vaginal bleeding, unspecified: Secondary | ICD-10-CM

## 2024-01-08 ENCOUNTER — Telehealth: Payer: Self-pay

## 2024-01-08 NOTE — Telephone Encounter (Signed)
 I called the patient to see if she's available for surgery w/ Dr. Cleatus on 02/09/24 at 7:30 am. Patient agreed to surgery date and was provided pre-op instructions by phone.

## 2024-01-19 ENCOUNTER — Ambulatory Visit (INDEPENDENT_AMBULATORY_CARE_PROVIDER_SITE_OTHER): Admitting: Urgent Care

## 2024-01-19 ENCOUNTER — Encounter: Payer: Self-pay | Admitting: Urgent Care

## 2024-01-19 VITALS — BP 123/80 | HR 79 | Ht 62.0 in | Wt 170.0 lb

## 2024-01-19 DIAGNOSIS — E039 Hypothyroidism, unspecified: Secondary | ICD-10-CM | POA: Diagnosis not present

## 2024-01-19 DIAGNOSIS — R0981 Nasal congestion: Secondary | ICD-10-CM

## 2024-01-19 DIAGNOSIS — J4521 Mild intermittent asthma with (acute) exacerbation: Secondary | ICD-10-CM

## 2024-01-19 MED ORDER — DEXAMETHASONE SODIUM PHOSPHATE 10 MG/ML IJ SOLN
10.0000 mg | Freq: Once | INTRAMUSCULAR | Status: AC
  Administered 2024-01-19: 10 mg via INTRAMUSCULAR

## 2024-01-19 MED ORDER — MONTELUKAST SODIUM 10 MG PO TABS: 10.0000 mg | ORAL_TABLET | Freq: Every day | ORAL | 3 refills | Status: DC

## 2024-01-19 MED ORDER — BUDESONIDE 32 MCG/ACT NA SUSP: 1.0000 | Freq: Two times a day (BID) | NASAL | 0 refills | Status: DC

## 2024-01-19 NOTE — Progress Notes (Signed)
 Established Patient Office Visit  Subjective:  Patient ID: Cristina Zimmerman, female    DOB: 08-Oct-1985  Age: 38 y.o. MRN: [id number]  Chief Complaint  Patient presents with   Follow-up    labs   Sinus Problem    Right sided with congestion    HPI  Discussed the use of AI scribe software for clinical note transcription with the patient, who gave verbal consent to proceed.  History of Present Illness   Cristina Zimmerman is a 38 year old female with chronic endometritis and thyroid  disorder who presents for follow-up on her symptoms and medication management.  She has been feeling much better since her last visit, attributing the improvement to her thyroid  medication. She is currently on Armour Thyroid  60 mg, which was switched from levothyroxine . However, she has been without the medication for the past two days due to a pharmacy delay and plans to resume it today.  She continues to experience intermittent bleeding and has a scheduled D&C and hysteroscopy in three weeks. She has not been on hormone therapy since her last hospital visit. She was previously on Mannose for bleeding, which she completed, and is not currently on Lysteda .  She reports fatigue and weakness, which have improved but persist. She takes Vitron C for iron  supplementation but notes it upsets her stomach, limiting her intake.  She experiences congestion and sinus pain, which worsened last week. No fever or chills, but she reports ear pressure, sore throat, postnasal drip, and a cough. She has been using Allegra  and ibuprofen for relief, but symptoms persist. She has a history of adverse reactions to prednisone , describing a 'crawling out of my skin' sensation.  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       Patient Active Problem List   Diagnosis Date Noted   Iron  deficiency anemia due to chronic blood loss 12/03/2023    Acute blood loss anemia (ABLA) 10/17/2023   Menorrhagia with regular cycle 10/17/2023   Myalgia 03/03/2022   Right ear pain 03/03/2022   Depression 06/14/2018   Vertebral compression fracture, initial encounter 08/22/2016   Chronic migraine without aura with status migrainosus, not intractable 12/03/2015   Migraine with aura and with status migrainosus 12/03/2015   Migraine with aura and without status migrainosus, not intractable 11/22/2015   Hypothyroidism 07/22/2012   Childhood asthma 07/20/2012   Bronchospasm 07/20/2012   Allergy    Anxiety    Turner syndrome    Congenital dysplasia of hip    Vitamin D  deficiency    Hematuria    Familial hyperlipidemia, high LDL    Hypertension    Osteoporosis    NASH (nonalcoholic steatohepatitis)    Scoliosis    Past Medical History:  Diagnosis Date   Allergy    Anxiety    Hematuria    Hip dysplasia    Hyperlipidemia    Hypertension    Migraine    NASH (nonalcoholic steatohepatitis)    Osteoporosis    Scoliosis    minimal   Thyroid  disease    Turner syndrome    Vitamin D  deficiency    Past Surgical History:  Procedure Laterality Date   BILATERAL OOPHORECTOMY     HERNIA REPAIR     x2   HIP SURGERY     KYPHOPLASTY  08/2016   L2   TONSILLECTOMY     TYMPANOSTOMY TUBE PLACEMENT  Social History   Tobacco Use   Smoking status: Never   Smokeless tobacco: Never  Vaping Use   Vaping status: Never Used  Substance Use Topics   Alcohol use: Not Currently    Comment: socially   Drug use: No      ROS: as noted in HPI  Objective:     BP 123/80   Pulse 79   Ht 5' 2 (1.575 m)   Wt 170 lb (77.1 kg)   LMP  (LMP Unknown)   SpO2 100%   BMI 31.09 kg/m  BP Readings from Last 3 Encounters:  01/19/24 123/80  12/30/23 119/87  12/03/23 104/73   Wt Readings from Last 3 Encounters:  01/19/24 170 lb (77.1 kg)  12/30/23 169 lb (76.7 kg)  12/03/23 172 lb 4 oz (78.1 kg)      Physical Exam Vitals and nursing note  reviewed.  Constitutional:      General: She is not in acute distress.    Appearance: Normal appearance. She is not ill-appearing, toxic-appearing or diaphoretic.  HENT:     Head: Normocephalic and atraumatic.     Right Ear: Ear canal and external ear normal. No drainage, swelling or tenderness. A middle ear effusion is present. There is no impacted cerumen. Tympanic membrane is not injected, scarred, perforated or erythematous.     Left Ear: Ear canal and external ear normal. No drainage, swelling or tenderness. A middle ear effusion is present. There is no impacted cerumen. Tympanic membrane is not injected, scarred, perforated or erythematous.     Nose: Congestion and rhinorrhea present. Rhinorrhea is clear.     Right Turbinates: Enlarged and swollen.     Left Turbinates: Enlarged and swollen.     Right Sinus: No maxillary sinus tenderness or frontal sinus tenderness.     Left Sinus: No maxillary sinus tenderness or frontal sinus tenderness.     Mouth/Throat:     Lips: Pink.     Mouth: Mucous membranes are moist.     Pharynx: Oropharynx is clear. Uvula midline. No pharyngeal swelling, oropharyngeal exudate, posterior oropharyngeal erythema or uvula swelling.  Cardiovascular:     Rate and Rhythm: Normal rate and regular rhythm.  Pulmonary:     Effort: Pulmonary effort is normal. No respiratory distress.     Breath sounds: Normal breath sounds. No stridor. No wheezing, rhonchi or rales.  Musculoskeletal:     Cervical back: Normal range of motion and neck supple. No rigidity or tenderness.  Lymphadenopathy:     Cervical: No cervical adenopathy.  Skin:    General: Skin is warm and dry.     Coloration: Skin is not jaundiced.     Findings: No bruising, erythema or rash.  Neurological:     General: No focal deficit present.     Mental Status: She is alert and oriented to person, place, and time.     Motor: No weakness.     Gait: Gait normal.      No results found for any visits on  01/19/24.  Last CBC Lab Results  Component Value Date   WBC 7.4 12/03/2023   HGB 10.3 (L) 12/03/2023   HCT 35.3 12/03/2023   MCV 77 (L) 12/03/2023   MCH 22.4 (L) 12/03/2023   RDW 17.4 (H) 12/03/2023   PLT 351 12/03/2023   Last metabolic panel Lab Results  Component Value Date   GLUCOSE 89 12/03/2023   NA 136 12/03/2023   K 4.3 12/03/2023   CL 102 12/03/2023  CO2 18 (L) 12/03/2023   BUN 11 12/03/2023   CREATININE 0.92 12/03/2023   EGFR 81 12/03/2023   CALCIUM  9.2 12/03/2023   PROT 7.3 12/03/2023   ALBUMIN 4.8 12/03/2023   LABGLOB 2.5 12/03/2023   BILITOT 0.4 12/03/2023   ALKPHOS 69 12/03/2023   AST 24 12/03/2023   ALT 32 12/03/2023   ANIONGAP 9 10/18/2023   Last lipids Lab Results  Component Value Date   CHOL 251 (H) 01/27/2022   HDL 40 (L) 01/27/2022   LDLCALC 175 (H) 01/27/2022   TRIG 196 (H) 01/27/2022   CHOLHDL 6.3 (H) 01/27/2022   Last hemoglobin A1c Lab Results  Component Value Date   HGBA1C 5.3 10/17/2023   Last thyroid  functions Lab Results  Component Value Date   TSH 11.000 (H) 12/03/2023   Last vitamin D  Lab Results  Component Value Date   VD25OH 20 (L) 03/03/2022   Last vitamin B12 and Folate No results found for: VITAMINB12, FOLATE    The ASCVD Risk score (Arnett DK, et al., 2019) failed to calculate for the following reasons:   The 2019 ASCVD risk score is only valid for ages 7 to 64  Assessment & Plan:  Acquired hypothyroidism -     TSH + free T4  Nasal congestion -     Montelukast Sodium; Take 1 tablet (10 mg total) by mouth at bedtime.  Dispense: 30 tablet; Refill: 3 -     Budesonide; Place 1 spray into both nostrils in the morning and at bedtime.  Dispense: 8.43 mL; Refill: 0  Mild intermittent asthma with acute exacerbation -     Montelukast Sodium; Take 1 tablet (10 mg total) by mouth at bedtime.  Dispense: 30 tablet; Refill: 3 -     dexAMETHasone  Sodium Phosphate  Assessment and Plan    Chronic abnormal uterine  bleeding and chronic endometritis Intermittent bleeding persists. Endometrial biopsy showed chronic endometritis. No current hormone therapy due to insurance and pharmacy issues. - Proceed with scheduled D&C and hysteroscopy in three weeks. - Resume hormone therapy as prescribed once available.  Iron  deficiency anemia Iron  levels are low with a recent value of 19. Hemoglobin has improved since June. Stomach upset with Vitron C leads to inconsistent intake. Goal iron  level is 50-70 for symptom improvement. - Continue Vitron C as tolerated. - Monitor iron  levels post-procedure to assess improvement.  Allergic rhinitis with sinus inflammation Congestion, sinus pain, and postnasal drip for about a month. Symptoms not improving with Allegra  and ibuprofen. Examination shows clear nasal passages with fluid behind ears, indicating inflammation rather than bacterial infection. Prednisone  not tolerated; alternative steroid injection preferred. - Administer steroid injection today. - Prescribe Singulair to be taken at night. - Recommend over-the-counter Rhinocort if prescription is not covered.  Asthma Mild shortness of breath likely exacerbated by sinus inflammation. - Prescribe Singulair to help with asthma and inflammation.  Hypothyroidism Improvement in symptoms with Armour Thyroid . TSH was previously 11. Off medication for two days due to pharmacy issues but plans to resume today. - Recheck TSH today. - Resume Armour Thyroid  at current dosage.         Return in about 6 months (around 07/18/2024).   Benton LITTIE Gave, PA

## 2024-01-19 NOTE — Patient Instructions (Addendum)
 Please take montelukast nightly. Use budesonide nasal spray twice daily to help with nasal congestion and post nasal drainage. Continue your allegra .  We will contact you with results of your labs via Mychart. Continue Armour thyroid  for now.  Return in 6 months, sooner as needed.

## 2024-01-20 LAB — TSH+FREE T4
Free T4: 0.93 ng/dL (ref 0.82–1.77)
TSH: 15.9 u[IU]/mL — ABNORMAL HIGH (ref 0.450–4.500)

## 2024-01-21 ENCOUNTER — Ambulatory Visit: Payer: Self-pay | Admitting: Urgent Care

## 2024-01-21 DIAGNOSIS — E039 Hypothyroidism, unspecified: Secondary | ICD-10-CM

## 2024-01-21 MED ORDER — THYROID 90 MG PO TABS: 90.0000 mg | ORAL_TABLET | Freq: Every day | ORAL | 2 refills | Status: DC

## 2024-02-02 ENCOUNTER — Encounter (HOSPITAL_COMMUNITY): Payer: Self-pay | Admitting: Obstetrics and Gynecology

## 2024-02-02 NOTE — Progress Notes (Signed)
 Spoke w/ via phone for pre-op interview--- [facility] Lab needs dos----  UPT and T&S per surgeon. BMP per anesthesia.       Lab results------Current EKG on Epic dated 10/17/23. COVID test -----patient states asymptomatic no test needed Arrive at -------0530 NPO after MN NO Solid Food.   Pre-Surgery Ensure or G2:  Med rec completed Medications to take morning of surgery -----Bring Albuterol  inhaler, Amerge PRN, Propranolol  and thyroid  Armour.  Diabetic medication -----  GLP1 agonist last dose: GLP1 instructions:  Patient instructed no nail polish to be worn day of surgery Patient instructed to bring photo id and insurance card day of surgery Patient aware to have Driver (ride ) / caregiver    for 24 hours after surgery - Mother Arielle Eber Patient Special Instructions ----- Pre-Op special Instructions -----  Patient verbalized understanding of instructions that were given at this phone interview. Patient denies chest pain, sob, fever, cough at the interview.

## 2024-02-08 NOTE — Anesthesia Preprocedure Evaluation (Signed)
 Anesthesia Evaluation  Patient identified by MRN, date of birth, ID band Patient awake    Reviewed: Allergy & Precautions, NPO status , Patient's Chart, lab work & pertinent test results, reviewed documented beta blocker date and time   History of Anesthesia Complications Negative for: history of anesthetic complications  Airway Mallampati: III  TM Distance: >3 FB Neck ROM: Full    Dental  (+) Dental Advisory Given   Pulmonary asthma    Pulmonary exam normal        Cardiovascular hypertension, Pt. on home beta blockers and Pt. on medications Normal cardiovascular exam     Neuro/Psych  Headaches PSYCHIATRIC DISORDERS Anxiety Depression       GI/Hepatic ,GERD  Medicated and Controlled,,(+) Hepatitis -  Endo/Other  Hypothyroidism   Obesity   Renal/GU negative Renal ROS     Musculoskeletal negative musculoskeletal ROS (+)    Abdominal   Peds  Hematology negative hematology ROS (+)   Anesthesia Other Findings Turner Syndrome   Reproductive/Obstetrics                              Anesthesia Physical Anesthesia Plan  ASA: 2  Anesthesia Plan: General   Post-op Pain Management: Tylenol  PO (pre-op)* and Celebrex PO (pre-op)*   Induction: Intravenous  PONV Risk Score and Plan: 3 and Treatment may vary due to age or medical condition, Ondansetron , Dexamethasone  and Midazolam  Airway Management Planned: LMA  Additional Equipment: None  Intra-op Plan:   Post-operative Plan: Extubation in OR  Informed Consent: I have reviewed the patients History and Physical, chart, labs and discussed the procedure including the risks, benefits and alternatives for the proposed anesthesia with the patient or authorized representative who has indicated his/her understanding and acceptance.     Dental advisory given  Plan Discussed with: CRNA and Anesthesiologist  Anesthesia Plan Comments:           Anesthesia Quick Evaluation

## 2024-02-09 ENCOUNTER — Encounter (HOSPITAL_COMMUNITY)
Admission: RE | Disposition: A | Discharge status: Home / Self Care | Payer: Self-pay | Source: Home / Self Care | Attending: Obstetrics and Gynecology

## 2024-02-09 ENCOUNTER — Encounter (HOSPITAL_COMMUNITY): Payer: Self-pay | Admitting: Obstetrics and Gynecology

## 2024-02-09 ENCOUNTER — Ambulatory Visit (HOSPITAL_COMMUNITY)
Admission: RE | Admit: 2024-02-09 | Discharge: 2024-02-09 | Disposition: A | Discharge status: Home / Self Care | Attending: Obstetrics and Gynecology | Admitting: Obstetrics and Gynecology

## 2024-02-09 ENCOUNTER — Ambulatory Visit (HOSPITAL_COMMUNITY): Payer: Self-pay | Admitting: Anesthesiology

## 2024-02-09 ENCOUNTER — Other Ambulatory Visit: Payer: Self-pay

## 2024-02-09 DIAGNOSIS — E669 Obesity, unspecified: Secondary | ICD-10-CM | POA: Diagnosis not present

## 2024-02-09 DIAGNOSIS — R519 Headache, unspecified: Secondary | ICD-10-CM | POA: Diagnosis not present

## 2024-02-09 DIAGNOSIS — N920 Excessive and frequent menstruation with regular cycle: Secondary | ICD-10-CM | POA: Diagnosis present

## 2024-02-09 DIAGNOSIS — D62 Acute posthemorrhagic anemia: Secondary | ICD-10-CM

## 2024-02-09 DIAGNOSIS — E039 Hypothyroidism, unspecified: Secondary | ICD-10-CM | POA: Diagnosis not present

## 2024-02-09 DIAGNOSIS — J45909 Unspecified asthma, uncomplicated: Secondary | ICD-10-CM | POA: Diagnosis not present

## 2024-02-09 DIAGNOSIS — I10 Essential (primary) hypertension: Secondary | ICD-10-CM | POA: Diagnosis not present

## 2024-02-09 DIAGNOSIS — Z6831 Body mass index (BMI) 31.0-31.9, adult: Secondary | ICD-10-CM | POA: Diagnosis not present

## 2024-02-09 DIAGNOSIS — F419 Anxiety disorder, unspecified: Secondary | ICD-10-CM | POA: Diagnosis not present

## 2024-02-09 DIAGNOSIS — D219 Benign neoplasm of connective and other soft tissue, unspecified: Secondary | ICD-10-CM | POA: Diagnosis not present

## 2024-02-09 DIAGNOSIS — Z79899 Other long term (current) drug therapy: Secondary | ICD-10-CM | POA: Insufficient documentation

## 2024-02-09 DIAGNOSIS — Q969 Turner's syndrome, unspecified: Secondary | ICD-10-CM

## 2024-02-09 DIAGNOSIS — Z79890 Hormone replacement therapy (postmenopausal): Secondary | ICD-10-CM | POA: Diagnosis not present

## 2024-02-09 DIAGNOSIS — K219 Gastro-esophageal reflux disease without esophagitis: Secondary | ICD-10-CM | POA: Insufficient documentation

## 2024-02-09 DIAGNOSIS — D259 Leiomyoma of uterus, unspecified: Secondary | ICD-10-CM

## 2024-02-09 DIAGNOSIS — D250 Submucous leiomyoma of uterus: Secondary | ICD-10-CM | POA: Diagnosis present

## 2024-02-09 DIAGNOSIS — K759 Inflammatory liver disease, unspecified: Secondary | ICD-10-CM | POA: Diagnosis not present

## 2024-02-09 DIAGNOSIS — F32A Depression, unspecified: Secondary | ICD-10-CM | POA: Insufficient documentation

## 2024-02-09 HISTORY — PX: HYSTEROSCOPY WITH MYOMECTOMY: SHX7591

## 2024-02-09 HISTORY — DX: Other complications of anesthesia, initial encounter: T88.59XA

## 2024-02-09 LAB — BASIC METABOLIC PANEL WITH GFR
Anion gap: 8 (ref 5–15)
BUN: 14 mg/dL (ref 6–20)
CO2: 19 mmol/L — ABNORMAL LOW (ref 22–32)
Calcium: 8.7 mg/dL — ABNORMAL LOW (ref 8.9–10.3)
Chloride: 108 mmol/L (ref 98–111)
Creatinine, Ser: 0.9 mg/dL (ref 0.44–1.00)
GFR, Estimated: >60 mL/min (ref >60)
Glucose, Bld: 106 mg/dL — ABNORMAL HIGH (ref 70–99)
Potassium: 4.1 mmol/L (ref 3.5–5.1)
Sodium: 135 mmol/L (ref 135–145)

## 2024-02-09 LAB — TYPE AND SCREEN
ABO/RH(D): O POS
Antibody Screen: NEGATIVE

## 2024-02-09 LAB — POCT PREGNANCY, URINE: Preg Test, Ur: NEGATIVE

## 2024-02-09 SURGERY — HYSTEROSCOPY WITH MYOMECTOMY
Anesthesia: General

## 2024-02-09 MED ORDER — PROPOFOL 10 MG/ML IV BOLUS
INTRAVENOUS | Status: AC
Start: 2024-02-09 — End: 2024-02-09
  Filled 2024-02-09: qty 20

## 2024-02-09 MED ORDER — CHLORHEXIDINE GLUCONATE 0.12 % MT SOLN
OROMUCOSAL | Status: AC
  Filled 2024-02-09: qty 15

## 2024-02-09 MED ORDER — OXYCODONE HCL 5 MG/5ML PO SOLN: 5.0000 mg | Freq: Once | ORAL | Status: DC | PRN

## 2024-02-09 MED ORDER — LACTATED RINGERS IV SOLN: INTRAVENOUS | Status: DC

## 2024-02-09 MED ORDER — SODIUM CHLORIDE 0.9 % IR SOLN
Status: DC | PRN
  Administered 2024-02-09 (×2): 3000 mL

## 2024-02-09 MED ORDER — LIDOCAINE 2% (20 MG/ML) 5 ML SYRINGE
INTRAMUSCULAR | Status: AC
  Filled 2024-02-09: qty 5

## 2024-02-09 MED ORDER — MIDAZOLAM HCL (PF) 2 MG/2ML IJ SOLN
INTRAMUSCULAR | Status: DC | PRN
  Administered 2024-02-09: 2 mg via INTRAVENOUS

## 2024-02-09 MED ORDER — FENTANYL CITRATE (PF) 100 MCG/2ML IJ SOLN
INTRAMUSCULAR | Status: AC
  Filled 2024-02-09: qty 2

## 2024-02-09 MED ORDER — CHLORHEXIDINE GLUCONATE 0.12 % MT SOLN
15.0000 mL | Freq: Once | OROMUCOSAL | Status: AC
  Administered 2024-02-09: 15 mL via OROMUCOSAL

## 2024-02-09 MED ORDER — ACETAMINOPHEN 500 MG PO TABS
ORAL_TABLET | ORAL | Status: AC
  Filled 2024-02-09: qty 2

## 2024-02-09 MED ORDER — AMISULPRIDE (ANTIEMETIC) 5 MG/2ML IV SOLN: 10.0000 mg | Freq: Once | INTRAVENOUS | Status: DC | PRN

## 2024-02-09 MED ORDER — FENTANYL CITRATE (PF) 100 MCG/2ML IJ SOLN: 25.0000 ug | INTRAMUSCULAR | Status: DC | PRN

## 2024-02-09 MED ORDER — SODIUM CHLORIDE 0.9 % IV SOLN: 12.5000 mg | INTRAVENOUS | Status: DC | PRN

## 2024-02-09 MED ORDER — CELECOXIB 200 MG PO CAPS
200.0000 mg | ORAL_CAPSULE | Freq: Once | ORAL | Status: AC
  Administered 2024-02-09: 200 mg via ORAL

## 2024-02-09 MED ORDER — FENTANYL CITRATE (PF) 100 MCG/2ML IJ SOLN
INTRAMUSCULAR | Status: DC | PRN
  Administered 2024-02-09 (×2): 50 ug via INTRAVENOUS

## 2024-02-09 MED ORDER — LIDOCAINE 2% (20 MG/ML) 5 ML SYRINGE
INTRAMUSCULAR | Status: DC | PRN
  Administered 2024-02-09: 60 mg via INTRAVENOUS

## 2024-02-09 MED ORDER — MIDAZOLAM HCL 2 MG/2ML IJ SOLN
INTRAMUSCULAR | Status: AC
  Filled 2024-02-09: qty 2

## 2024-02-09 MED ORDER — POVIDONE-IODINE 10 % EX SWAB: 2.0000 | Freq: Once | CUTANEOUS | Status: DC

## 2024-02-09 MED ORDER — PROPOFOL 10 MG/ML IV BOLUS
INTRAVENOUS | Status: DC | PRN
  Administered 2024-02-09: 200 mg via INTRAVENOUS

## 2024-02-09 MED ORDER — CELECOXIB 200 MG PO CAPS
ORAL_CAPSULE | ORAL | Status: AC
  Filled 2024-02-09: qty 1

## 2024-02-09 MED ORDER — ORAL CARE MOUTH RINSE: 15.0000 mL | Freq: Once | OROMUCOSAL | Status: AC

## 2024-02-09 MED ORDER — ONDANSETRON HCL 4 MG/2ML IJ SOLN
INTRAMUSCULAR | Status: DC | PRN
  Administered 2024-02-09: 4 mg via INTRAVENOUS

## 2024-02-09 MED ORDER — DEXAMETHASONE SOD PHOSPHATE PF 10 MG/ML IJ SOLN
INTRAMUSCULAR | Status: DC | PRN
  Administered 2024-02-09: 4 mg via INTRAVENOUS

## 2024-02-09 MED ORDER — OXYCODONE HCL 5 MG PO TABS: 5.0000 mg | ORAL_TABLET | Freq: Once | ORAL | Status: DC | PRN

## 2024-02-09 MED ORDER — ACETAMINOPHEN 500 MG PO TABS
1000.0000 mg | ORAL_TABLET | ORAL | Status: AC
  Administered 2024-02-09: 1000 mg via ORAL

## 2024-02-09 MED ORDER — ONDANSETRON HCL 4 MG/2ML IJ SOLN
INTRAMUSCULAR | Status: AC
  Filled 2024-02-09: qty 2

## 2024-02-09 SURGICAL SUPPLY — 12 items
CATH ROBINSON RED A/P 16FR (CATHETERS) IMPLANT
COVER MAYO STAND STRL (DRAPES) ×1 IMPLANT
DEVICE MYOSURE LITE (MISCELLANEOUS) IMPLANT
DEVICE MYOSURE REACH (MISCELLANEOUS) IMPLANT
GOWN STRL REUS W/ TWL LRG LVL3 (GOWN DISPOSABLE) ×2 IMPLANT
KIT PROCED FLUENT PRO FLT212S (KITS) ×1 IMPLANT
KIT TURNOVER KIT B (KITS) ×1 IMPLANT
PACK VAGINAL MINOR WOMEN LF (CUSTOM PROCEDURE TRAY) ×1 IMPLANT
PAD OB MATERNITY 11 LF (PERSONAL CARE ITEMS) ×1 IMPLANT
SEAL ROD LENS SCOPE MYOSURE (ABLATOR) ×1 IMPLANT
TOWEL GREEN STERILE FF (TOWEL DISPOSABLE) ×1 IMPLANT
UNDERPAD 30X36 HEAVY ABSORB (UNDERPADS AND DIAPERS) ×1 IMPLANT

## 2024-02-09 NOTE — Op Note (Signed)
 Preop: PMB on HT, Uterine fibroid, Acute blood loss anemia Postop: Same Procedure: D&C, hysteroscopic resection of fibroid Surgeon: Dr. Cleatus Assist: None EBL 5 cc IVF: 500 cc  UOP: Not drained Specimens: Endometrial resection Anesthesia: LMA  Findings: Normal external female genitalia. Normal appearing vagina and cervix. Ostia visualized bilaterally. Cavity overall smooth without defect. An area on the patient's right lateral wall of her uterus was more pronounced suspicious for submucosal fibroid. No other cavitary defect. Anterior wall of the body of the uterus with changes possible due to changes from HT (hysteroscopic directed sample removed from here as well.   Description of the procedure: Preop antibiotics not indicated. Informed consent reviewed and signed. Pt given opportunity to ask questions.   Pt prepped and draped in the dorsal lithotomy fashion after LMA anesthesia found to be adequate. Timeout performed.   Open sided speculum placed into the patient's vagina. Single tooth tenaculum applied to the 12 o'clock position of the cervix. Cervix progressively dilated to a 23 Pratt with ease. 30 degree hysteroscope unable to be inserted into the cavity. I dilated to 25 Pratt and unable to pass scope. I then dilated to 27 and was able to pass the scope with ease. There were the aformentioned findings. The Myosure Reach was used to remove the changes on the anterior body of the uterus. The device was then used to remove the protrusion from the right side suspected to be a fibroid. This was progressively removed until the contour of the cavity was smooth. No bleeding noted. Gentle curettage then performed in all quadrants.   Hemostatic at the end of the procedure. Procedure completed. All instruments removed. Counts correct x2.  Pt taken to recovery room in stable condition.  Vina Cleatus, MD Attending Obstetrician & Gynecologist, [facility] for [facility], [facility]

## 2024-02-09 NOTE — Anesthesia Postprocedure Evaluation (Signed)
 Anesthesia Post Note  Patient: Cristina Zimmerman  Procedure(s) Performed: HYSTEROSCOPY WITH MYOMECTOMY Using [facility]     Patient location during evaluation: PACU Anesthesia Type: General Level of consciousness: awake and alert Pain management: pain level controlled Vital Signs Assessment: post-procedure vital signs reviewed and stable Respiratory status: spontaneous breathing, nonlabored ventilation and respiratory function stable Cardiovascular status: stable and blood pressure returned to baseline Anesthetic complications: no   No notable events documented.  Last Vitals:  Vitals:   02/09/24 0845 02/09/24 0847  BP: 115/84   Pulse: 80 83  Resp: 12 15  Temp:  36.9 C  SpO2: 99% 96%    Last Pain:  Vitals:   02/09/24 0847  TempSrc:   PainSc: 0-No pain                 Debby FORBES Like

## 2024-02-09 NOTE — Anesthesia Procedure Notes (Signed)
 Procedure Name: LMA Insertion Date/Time: 02/09/2024 7:39 AM  Performed by: Cordella Elvie HERO, CRNAPre-anesthesia Checklist: Patient identified, Emergency Drugs available, Suction available, Patient being monitored and Timeout performed Patient Re-evaluated:Patient Re-evaluated prior to induction Oxygen Delivery Method: Circle system utilized Preoxygenation: Pre-oxygenation with 100% oxygen Induction Type: IV induction LMA: LMA inserted LMA Size: 4.0 Number of attempts: 1 Placement Confirmation: positive ETCO2, CO2 detector and breath sounds checked- equal and bilateral Tube secured with: Tape Dental Injury: Teeth and Oropharynx as per pre-operative assessment

## 2024-02-09 NOTE — Transfer of Care (Signed)
 Immediate Anesthesia Transfer of Care Note  Patient: Cristina Zimmerman  Procedure(s) Performed: HYSTEROSCOPY WITH MYOMECTOMY Using [facility]  Patient Location: PACU  Anesthesia Type:General  Level of Consciousness: awake, alert , oriented, and patient cooperative  Airway & Oxygen Therapy: Patient Spontanous Breathing and Patient connected to nasal cannula oxygen  Post-op Assessment: Report given to RN, Post -op Vital signs reviewed and stable, and Patient moving all extremities X 4  Post vital signs: Reviewed and stable  Last Vitals:  Vitals Value Taken Time  BP 111/87 02/09/24 08:18  Temp    Pulse 91 02/09/24 08:21  Resp 22 02/09/24 08:21  SpO2 93 % 02/09/24 08:21  Vitals shown include unfiled device data.  Last Pain:  Vitals:   02/09/24 0644  TempSrc: Oral  PainSc: 0-No pain      Patients Stated Pain Goal: 5 (02/09/24 9355)  Complications: No notable events documented.

## 2024-02-09 NOTE — H&P (Signed)
 [facility] Attending History and Physical  Cristina Zimmerman is a 38 y.o. G0P0000 who presented to for HVB which occurred on 10/17/23. She was seen in the ED and was admitted for blood transfusion. Prior to the bleeding, she had an adjustment in her HRT with a reduction in her progesterone . She was having bleeding every 2 weeks. She received 2 units of blood. She was given Iron . She was given megace  to start (now completed) then reevaluate her HRT.   HRT had been managed by PCP.  Had been having issues and had E2 reduced. Issues were heavy bleeding for last 2 years. E2 patch is 0.075mg . Progesterone  is not normally Megace  - she was given this and Depo from the emergency room. Bleeding stopped for 3.5 weeks. Takes Progesterone  200 mg 10 days per month but doesn't always remember.    I reviewed her US  images myself. EL is 19 mm, 1.5 cm round structure that appears potentially most consistent with submucosal fibroid best seen on #1-3, Image 7/9.  She is here for surgical management of this.   Since our last visit her bleeding has stopped.    Past Medical History:  Diagnosis Date   Allergy    Anxiety    Complication of anesthesia    concerned about heart rate, can't remember if high or low , slow to wake, history of nausea   Hematuria    Hip dysplasia    Hyperlipidemia    Hypertension    Migraine    NASH (nonalcoholic steatohepatitis)    Osteoporosis    Scoliosis    minimal   Thyroid  disease    Turner syndrome    Vitamin D  deficiency    Past Surgical History:  Procedure Laterality Date   BILATERAL OOPHORECTOMY     HERNIA REPAIR     x2   HIP SURGERY     KYPHOPLASTY  08/2016   L2   TONSILLECTOMY     TYMPANOSTOMY TUBE PLACEMENT     OB History  Gravida Para Term Preterm AB Living  0 0 0 0 0 0  SAB IAB Ectopic Multiple Live Births  0 0 0 0 0  Patient denies any other pertinent gynecologic issues.  No current facility-administered medications on  file prior to encounter.   Current Outpatient Medications on File Prior to Encounter  Medication Sig Dispense Refill   b complex vitamins capsule Take 1 capsule by mouth daily after breakfast.     Estradiol  (IMVEXXY  MAINTENANCE PACK) 10 MCG INST Place 1 Insert vaginally 2 (two) times a week. 8 each 12   estradiol  (VIVELLE -DOT) 0.1 MG/24HR patch Place 1 patch (0.1 mg total) onto the skin 2 (two) times a week. 8 patch 12   ferrous sulfate  325 (65 FE) MG tablet Take 1 tablet (325 mg total) by mouth daily. 30 tablet 0   ibuprofen (ADVIL) 200 MG tablet Take 400 mg by mouth 2 (two) times daily as needed for headache, moderate pain (pain score 4-6) or cramping.     Multiple Vitamins-Minerals (MULTIVITAMIN WOMEN) TABS Take 1 tablet by mouth daily with lunch.     naratriptan  (AMERGE) 2.5 MG tablet TAKE 1 TABLET AS NEEDED MIGRAINE MAY REPEAT AFTER 2 HOURS MAX 2 PER 24 HOURS. 12 tablet 11   nortriptyline  (PAMELOR ) 50 MG capsule Take 1 capsule (50 mg total) by mouth at bedtime. 90 capsule 3   omeprazole  (PRILOSEC OTC) 20 MG tablet Take 20 mg by mouth at bedtime.  progesterone  (PROMETRIUM ) 100 MG capsule Take 1 capsule (100 mg total) by mouth daily. 90 capsule 3   propranolol  ER (INDERAL  LA) 60 MG 24 hr capsule Take 1 capsule (60 mg total) by mouth daily. 90 capsule 3   tizanidine  (ZANAFLEX ) 2 MG capsule Take 1 capsule (2 mg total) by mouth 3 (three) times daily as needed for muscle spasms. 60 capsule 0   albuterol  (PROVENTIL  HFA;VENTOLIN  HFA) 108 (90 Base) MCG/ACT inhaler Inhale 2 puffs into the lungs every 6 (six) hours as needed for wheezing. 1 Inhaler 12   botulinum toxin Type A  (BOTOX ) 200 units injection Provider to inject 155 units into the muscles of the head and neck every 12 weeks. Discard remainder 1 each 2   promethazine  (PHENERGAN ) 12.5 MG tablet Take 1 tablet (12.5 mg total) by mouth every 6 (six) hours as needed for nausea or vomiting. 30 tablet 11   Allergies  Allergen Reactions   Tape  Other (See Comments)    Skin irritation, redness   Deltasone  [Prednisone ] Anxiety, Other (See Comments) and Hypertension    Jittery  Reaction is mainly to tablet formulations Pt does okay with steroid injections    Social History:   reports that she has never smoked. She has never used smokeless tobacco. She reports that she does not currently use alcohol. She reports that she does not use drugs. Family History  Problem Relation Age of Onset   Hypertension Mother    Arthritis Mother    Fibromyalgia Mother    Hyperlipidemia Mother    Migraines Mother    Diabetes Father    Alcohol abuse Maternal Grandfather    Cancer Maternal Grandfather        BRAIN   Diabetes Paternal Grandmother    Stroke Paternal Grandmother    Kidney disease Paternal Grandmother    Heart attack Paternal Grandmother    Hypertension Paternal Grandmother    Hyperlipidemia Paternal Grandmother    Diabetes Paternal Grandfather     Review of Systems: Pertinent items noted in HPI and remainder of comprehensive ROS otherwise negative.  PHYSICAL EXAM: Blood pressure 121/80, pulse 84, temperature 97.9 F (36.6 C), temperature source Oral, resp. rate 18, height 5' 2 (1.575 m), weight 77.1 kg, SpO2 99%. CONSTITUTIONAL: Well-developed, well-nourished female in no acute distress.  HENT:  Normocephalic, atraumatic, External right and left ear normal. Oropharynx is clear and moist EYES: Conjunctivae and EOM are normal. Pupils are equal, round, and reactive to light. No scleral icterus.  NECK: Normal range of motion, supple, no masses SKIN: Skin is warm and dry. No rash noted. Not diaphoretic. No erythema. No pallor. NEUROLOGIC: Alert and oriented to person, place, and time. Normal reflexes, muscle tone coordination. No cranial nerve deficit noted. PSYCHIATRIC: Normal mood and affect. Normal behavior. Normal judgment and thought content. CARDIOVASCULAR: Normal heart rate noted, regular rhythm RESPIRATORY: Effort and  breath sounds normal, no problems with respiration noted ABDOMEN: Soft, nontender, nondistended. PELVIC: Not examined MUSCULOSKELETAL: Normal range of motion. No tenderness.  No cyanosis, clubbing, or edema.  2+ distal pulses.  Labs: Results for orders placed or performed during the hospital encounter of 02/09/24 (from the past 2 weeks)  Pregnancy, urine POC   Collection Time: 02/09/24  6:37 AM  Result Value Ref Range   Preg Test, Ur NEGATIVE NEGATIVE    Imaging Studies: No results found.  Assessment: Active Problems:   Turner syndrome   Acute blood loss anemia (ABLA)   Fibroid   Plan: - Diagnosis: Suspected submucosal  fibroid (vs polyp) - Planned surgery: D&C, hysteroscopy, removal of fibroid - Risks of surgery include but are not limited to: bleeding, infection, injury to surrounding organs/tissues (i.e. bowel/bladder/ureters), need for additional procedures, wound complications, hospital re-admission, and conversion to open surgery, VTE, additional complications: Uterine perforation - We discussed postop restrictions, precautions and expectations - Preop testing needed: BMP pending, UPT negative.  - All questions answered  Vina Solian, MD, FACOG Obstetrician & Gynecologist, [facility] for [facility], [facility]

## 2024-02-10 ENCOUNTER — Encounter (HOSPITAL_COMMUNITY): Payer: Self-pay | Admitting: Obstetrics and Gynecology

## 2024-02-10 LAB — SURGICAL PATHOLOGY

## 2024-02-11 ENCOUNTER — Ambulatory Visit: Payer: Self-pay | Admitting: Obstetrics and Gynecology

## 2024-02-12 ENCOUNTER — Other Ambulatory Visit: Payer: Self-pay | Admitting: Urgent Care

## 2024-02-12 DIAGNOSIS — E039 Hypothyroidism, unspecified: Secondary | ICD-10-CM

## 2024-02-12 MED ORDER — THYROID 90 MG PO TABS
90.0000 mg | ORAL_TABLET | Freq: Every day | ORAL | 0 refills | Status: AC
Start: 2024-02-12 — End: ?

## 2024-02-12 NOTE — Progress Notes (Signed)
 Rx refill request for armour thyroid  received from the pharmacy. Will refill.

## 2024-03-06 ENCOUNTER — Ambulatory Visit
Admission: RE | Admit: 2024-03-06 | Discharge: 2024-03-06 | Disposition: A | Discharge status: Home / Self Care | Source: Ambulatory Visit | Attending: Family Medicine | Admitting: Family Medicine

## 2024-03-06 ENCOUNTER — Other Ambulatory Visit: Payer: Self-pay

## 2024-03-06 VITALS — BP 120/86 | HR 96 | Temp 98.0°F | Resp 16

## 2024-03-06 DIAGNOSIS — R350 Frequency of micturition: Secondary | ICD-10-CM | POA: Diagnosis not present

## 2024-03-06 LAB — POCT URINALYSIS DIP (MANUAL ENTRY)
Bilirubin, UA: NEGATIVE
Blood, UA: NEGATIVE
Glucose, UA: NEGATIVE mg/dL
Ketones, POC UA: NEGATIVE mg/dL
Leukocytes, UA: NEGATIVE
Nitrite, UA: NEGATIVE
Protein Ur, POC: NEGATIVE mg/dL
Spec Grav, UA: 1.01 (ref 1.010–1.025)
Urobilinogen, UA: 0.2 U/dL
pH, UA: 6.5 (ref 5.0–8.0)

## 2024-03-06 NOTE — ED Triage Notes (Addendum)
 Had hysteroscopy with D & C 3 weeks ago. A week and a half ago started having pressure/pain to bladder. Urinary urgency. No hematuria. Has odor to urine. No fever. Has had ibuprofen, which helps some with pain not frequency.

## 2024-03-06 NOTE — Discharge Instructions (Addendum)
 Advised patient urinary analysis was normal and within normal limits today.  Advised if symptoms worsen and/or unresolved please follow-up with your GYN or [facility] for further evaluation.  Contact information provided with his AVS today.

## 2024-03-06 NOTE — ED Provider Notes (Signed)
 Cristina Zimm[facility]    CSN: [id number] Arrival date & time: 03/06/24  1058      History   Chief Complaint Chief Complaint  Patient presents with   Urinary Frequency    Possible UTI, pain in bladder - Entered by patient    HPI Cristina Zimmerman is a 38 y.o. female.   HPI 38 year old female presents with urinary frequency for 10 days and reports recent hysterectomy with D&C 3 weeks ago.  Patient is concerned with possible urinary tract infection.  PMH significant for obesity, HTN, and, and chronic migraine  Past Medical History:  Diagnosis Date   Allergy    Anxiety    Complication of anesthesia    concerned about heart rate, can't remember if high or low , slow to wake, history of nausea   Hematuria    Hip dysplasia    Hyperlipidemia    Hypertension    Migraine    NASH (nonalcoholic steatohepatitis)    Osteoporosis    Scoliosis    minimal   Thyroid  disease    Turner syndrome    Vitamin D  deficiency     Patient Active Problem List   Diagnosis Date Noted   Fibroid 02/09/2024   Menorrhagia with regular cycle 02/09/2024   Iron  deficiency anemia due to chronic blood loss 12/03/2023   Acute blood loss anemia (ABLA) 10/17/2023   Myalgia 03/03/2022   Right ear pain 03/03/2022   Depression 06/14/2018   Vertebral compression fracture, initial encounter 08/22/2016   Chronic migraine without aura with status migrainosus, not intractable 12/03/2015   Migraine with aura and with status migrainosus 12/03/2015   Migraine with aura and without status migrainosus, not intractable 11/22/2015   Hypothyroidism 07/22/2012   Childhood asthma 07/20/2012   Bronchospasm 07/20/2012   Allergy    Anxiety    Turner syndrome    Congenital dysplasia of hip    Vitamin D  deficiency    Hematuria    Familial hyperlipidemia, high LDL    Hypertension    Osteoporosis    NASH (nonalcoholic steatohepatitis)    Scoliosis     Past Surgical History:  Procedure Laterality Date    BILATERAL OOPHORECTOMY     HERNIA REPAIR     x2   HIP SURGERY     HYSTEROSCOPY WITH MYOMECTOMY N/A 02/09/2024   Procedure: HYSTEROSCOPY WITH MYOMECTOMY Using MYOSURE;  Surgeon: Cleatus Moccasin, MD;  Location: [facility] OR;  Service: Gynecology;  Laterality: N/A;   KYPHOPLASTY  08/2016   L2   TONSILLECTOMY     TYMPANOSTOMY TUBE PLACEMENT      OB History     Gravida  0   Para  0   Term  0   Preterm  0   AB  0   Living  0      SAB  0   IAB  0   Ectopic  0   Multiple  0   Live Births  0            Home Medications    Prior to Admission medications   Medication Sig Start Date End Date Taking? Authorizing Provider  albuterol  (PROVENTIL  HFA;VENTOLIN  HFA) 108 (90 Base) MCG/ACT inhaler Inhale 2 puffs into the lungs every 6 (six) hours as needed for wheezing. 12/30/16   Ines Onetha NOVAK, MD  b complex vitamins capsule Take 1 capsule by mouth daily after breakfast.    [provider]  botulinum toxin Type A  (BOTOX ) 200 units injection Provider to inject 155  units into the muscles of the head and neck every 12 weeks. Discard remainder 06/25/23   Lomax, Amy, NP  Estradiol  (IMVEXXY  MAINTENANCE PACK) 10 MCG INST Place 1 Insert vaginally 2 (two) times a week. 12/31/23   Cleatus Moccasin, MD  estradiol  (VIVELLE -DOT) 0.1 MG/24HR patch Place 1 patch (0.1 mg total) onto the skin 2 (two) times a week. 12/31/23   Cleatus Moccasin, MD  ibuprofen (ADVIL) 200 MG tablet Take 400 mg by mouth 2 (two) times daily as needed for headache, moderate pain (pain score 4-6) or cramping.    [provider]  Multiple Vitamins-Minerals (MULTIVITAMIN WOMEN) TABS Take 1 tablet by mouth daily with lunch.    [provider]  naratriptan  (AMERGE) 2.5 MG tablet TAKE 1 TABLET AS NEEDED MIGRAINE MAY REPEAT AFTER 2 HOURS MAX 2 PER 24 HOURS. 06/22/23   Lomax, Amy, NP  nortriptyline  (PAMELOR ) 50 MG capsule Take 1 capsule (50 mg total) by mouth at bedtime. 06/22/23   Lomax, Amy, NP  omeprazole  (PRILOSEC  OTC) 20 MG tablet Take 20 mg by mouth at bedtime.    [provider]  progesterone  (PROMETRIUM ) 100 MG capsule Take 1 capsule (100 mg total) by mouth daily. 12/30/23   Cleatus Moccasin, MD  promethazine  (PHENERGAN ) 12.5 MG tablet Take 1 tablet (12.5 mg total) by mouth every 6 (six) hours as needed for nausea or vomiting. 04/25/21   Ines Onetha NOVAK, MD  propranolol  ER (INDERAL  LA) 60 MG 24 hr capsule Take 1 capsule (60 mg total) by mouth daily. 06/22/23   Lomax, Amy, NP  thyroid  (ARMOUR THYROID ) 90 MG tablet Take 1 tablet (90 mg total) by mouth daily. 02/12/24   Crain, Whitney L, PA  tizanidine  (ZANAFLEX ) 2 MG capsule Take 1 capsule (2 mg total) by mouth 3 (three) times daily as needed for muscle spasms. 06/22/23   Lomax, Amy, NP    Family History Family History  Problem Relation Age of Onset   Hypertension Mother    Arthritis Mother    Fibromyalgia Mother    Hyperlipidemia Mother    Migraines Mother    Diabetes Father    Alcohol abuse Maternal Grandfather    Cancer Maternal Grandfather        BRAIN   Diabetes Paternal Grandmother    Stroke Paternal Grandmother    Kidney disease Paternal Grandmother    Heart attack Paternal Grandmother    Hypertension Paternal Grandmother    Hyperlipidemia Paternal Grandmother    Diabetes Paternal Grandfather     Social History Social History   Tobacco Use   Smoking status: Never   Smokeless tobacco: Never  Vaping Use   Vaping status: Never Used  Substance Use Topics   Alcohol use: Not Currently    Comment: socially   Drug use: No     Allergies   Tape and Deltasone  [prednisone ]   Review of Systems Review of Systems  Genitourinary:  Positive for frequency.     Physical Exam Triage Vital Signs ED Triage Vitals  Encounter Vitals Group     BP 03/06/24 1115 120/86     Girls Systolic BP Percentile --      Girls Diastolic BP Percentile --      Boys Systolic BP Percentile --      Boys Diastolic BP Percentile --      Pulse Rate  03/06/24 1115 96     Resp 03/06/24 1115 16     Temp 03/06/24 1115 98 F (36.7 C)  Temp src --      SpO2 03/06/24 1115 98 %     Weight --      Height --      Head Circumference --      Peak Flow --      Pain Score 03/06/24 1119 4     Pain Loc --      Pain Education --      Exclude from Growth Chart --    No data found.  Updated Vital Signs BP 120/86   Pulse 96   Temp 98 F (36.7 C)   Resp 16   LMP  (LMP Unknown)   SpO2 98%   Visual Acuity Right Eye Distance:   Left Eye Distance:   Bilateral Distance:    Right Eye Near:   Left Eye Near:    Bilateral Near:     Physical Exam Vitals and nursing note reviewed.  Constitutional:      General: She is not in acute distress.    Appearance: Normal appearance. She is obese. She is not ill-appearing.  HENT:     Head: Normocephalic and atraumatic.     Mouth/Throat:     Mouth: Mucous membranes are moist.     Pharynx: Oropharynx is clear.  Eyes:     Extraocular Movements: Extraocular movements intact.     Pupils: Pupils are equal, round, and reactive to light.  Cardiovascular:     Rate and Rhythm: Regular rhythm.     Heart sounds: Normal heart sounds.  Pulmonary:     Effort: Pulmonary effort is normal.     Breath sounds: Normal breath sounds. No wheezing, rhonchi or rales.  Abdominal:     Tenderness: There is no right CVA tenderness or left CVA tenderness.  Musculoskeletal:        General: Normal range of motion.  Skin:    General: Skin is warm.  Neurological:     General: No focal deficit present.     Mental Status: She is alert and oriented to person, place, and time.      UC Treatments / Results  Labs (all labs ordered are listed, but only abnormal results are displayed) Labs Reviewed  POCT URINALYSIS DIP (MANUAL ENTRY)    EKG   Radiology No results found.  Procedures Procedures (including critical care time)  Medications Ordered in UC Medications - No data to display  Initial Impression /  Assessment and Plan / UC Course  I have reviewed the triage vital signs and the nursing notes.  Pertinent labs & imaging results that were available during my care of the patient were reviewed by me and considered in my medical decision making (see chart for details).     MDM: 1.  Urinary frequency-UA revealed above, patient advised. Advised patient urinary analysis was normal and within normal limits today.  Advised if symptoms worsen and/or unresolved please follow-up with your GYN or [facility] for further evaluation.  Contact information provided with his AVS today.  Patient discharged home, hemodynamically stable. Final Clinical Impressions(s) / UC Diagnoses   Final diagnoses:  Urinary frequency     Discharge Instructions      Advised patient urinary analysis was normal and within normal limits today.  Advised if symptoms worsen and/or unresolved please follow-up with your GYN or [facility] for further evaluation.  Contact information provided with his AVS today.     ED Prescriptions   None    PDMP not reviewed this encounter.  Teddy Sharper, FNP 03/06/24 1200

## 2024-03-11 ENCOUNTER — Encounter: Payer: Self-pay | Admitting: Family Medicine

## 2024-03-14 DIAGNOSIS — Z0289 Encounter for other administrative examinations: Secondary | ICD-10-CM

## 2024-03-14 NOTE — Telephone Encounter (Signed)
 Pt left me a VM requesting a Botox  appt. She has a balance due from her last appt in June, sent MyChart msg asking her to call billing.

## 2024-03-14 NOTE — Telephone Encounter (Signed)
 FMLA renewal ppw ready for Amy NP review/signature.

## 2024-03-15 NOTE — Telephone Encounter (Signed)
 FORM SIGNED AND PLACED FOR PICK UP BY MEDICAL RECORDS

## 2024-03-21 ENCOUNTER — Telehealth: Payer: Self-pay | Admitting: *Deleted

## 2024-03-21 NOTE — Telephone Encounter (Signed)
 Pt FMLA form faxed on 03/21/2024

## 2024-03-22 ENCOUNTER — Telehealth: Payer: Self-pay | Admitting: Family Medicine

## 2024-03-22 NOTE — Telephone Encounter (Signed)
 Completed [place] renewal form and requested it be done with [facility] as SP. Faxed with notes to [facility] @ [phone].

## 2024-03-23 MED ORDER — ONABOTULINUMTOXINA 200 UNITS IJ SOLR: 200.0000 [IU] | INTRAMUSCULAR | 3 refills | Status: AC

## 2024-03-23 NOTE — Telephone Encounter (Signed)
 Refill sent

## 2024-03-23 NOTE — Addendum Note (Signed)
 Addended by: ONEITA NEVELYN BRAVO on: 03/23/2024 08:57 AM   Modules accepted: Orders

## 2024-03-23 NOTE — Telephone Encounter (Signed)
 Auth was approved, please send rx to [facility].  Auth#: [id number] (03/22/24/03/21/25)

## 2024-04-15 ENCOUNTER — Other Ambulatory Visit: Payer: Self-pay | Admitting: Urgent Care

## 2024-04-15 DIAGNOSIS — J4521 Mild intermittent asthma with (acute) exacerbation: Secondary | ICD-10-CM

## 2024-04-15 DIAGNOSIS — R0981 Nasal congestion: Secondary | ICD-10-CM

## 2024-04-20 ENCOUNTER — Other Ambulatory Visit: Payer: Self-pay | Admitting: Obstetrics and Gynecology

## 2024-04-20 DIAGNOSIS — Q969 Turner's syndrome, unspecified: Secondary | ICD-10-CM

## 2024-04-26 ENCOUNTER — Encounter: Payer: Self-pay | Admitting: Family Medicine

## 2024-04-26 ENCOUNTER — Ambulatory Visit: Admitting: Family Medicine

## 2024-04-26 DIAGNOSIS — G43701 Chronic migraine without aura, not intractable, with status migrainosus: Secondary | ICD-10-CM | POA: Diagnosis not present

## 2024-04-26 MED ORDER — TIZANIDINE HCL 2 MG PO CAPS: 2.0000 mg | ORAL_CAPSULE | Freq: Three times a day (TID) | ORAL | 0 refills | Status: AC | PRN

## 2024-04-26 MED ORDER — PROMETHAZINE HCL 12.5 MG PO TABS: 12.5000 mg | ORAL_TABLET | Freq: Four times a day (QID) | ORAL | 0 refills | Status: AC | PRN

## 2024-04-26 MED ORDER — ONABOTULINUMTOXINA 200 UNITS IJ SOLR
155.0000 [IU] | Freq: Once | INTRAMUSCULAR | Status: AC
  Administered 2024-04-26: 155 [IU] via INTRAMUSCULAR

## 2024-04-26 NOTE — Progress Notes (Signed)
 Botox - 200 units x 1 vial Lot: [id number] Expiration: 2028/03 NDC: 0023-3921-02  Bacteriostatic 0.9% Sodium Chloride - 4 mL  Lot: [id number] Expiration: 07/19/2025 NDC: 9590803397  Dx: G43.701  S/P  Witnessed by [facility] [address] RMA

## 2024-04-26 NOTE — Progress Notes (Signed)
 "   04/26/2024 ALL: Cristina Zimmerman returns for Botox . Last procedure 09/2023. She was hospitalized for anemia requiring a blood transfusion. She has undergone myomectomy and menstrual cycles are significantly improved. Migraines are fairly well managed. She averages about 8 a month. Naratriptan  usually helps. She uses phenergan  and tizanidine  sparingly for intractable migraines.   10/15/2023 ALL: Cristina Zimmerman returns for Botox . 1st procedure 06/2023. She reports about 30% improvement in headache intensity and frequency. She tolerated procedure well.   07/16/2023 ALL: Cristina Zimmerman presents to start Botox . Baseline 12-16 headache days a month. She has about 4-8 migraines per month on propranolol  and nortriptyline . Naratriptan  usually helps abort migraine. She uses tizanidine  at night for neck tension. Phenergan  on occasion for nausea.    Consent Form Botulism Toxin Injection For Chronic Migraine    Reviewed orally with patient, additionally signature is on file:  Botulism toxin has been approved by the Federal drug administration for treatment of chronic migraine. Botulism toxin does not cure chronic migraine and it may not be effective in some patients.  The administration of botulism toxin is accomplished by injecting a small amount of toxin into the muscles of the neck and head. Dosage must be titrated for each individual. Any benefits resulting from botulism toxin tend to wear off after 3 months with a repeat injection required if benefit is to be maintained. Injections are usually done every 3-4 months with maximum effect peak achieved by about 2 or 3 weeks. Botulism toxin is expensive and you should be sure of what costs you will incur resulting from the injection.  The side effects of botulism toxin use for chronic migraine may include:   -Transient, and usually mild, facial weakness with facial injections  -Transient, and usually mild, head or neck weakness with head/neck injections  -Reduction or loss of forehead  facial animation due to forehead muscle weakness  -Eyelid drooping  -Dry eye  -Pain at the site of injection or bruising at the site of injection  -Double vision  -Potential unknown long term risks   Contraindications: You should not have Botox  if you are pregnant, nursing, allergic to albumin, have an infection, skin condition, or muscle weakness at the site of the injection, or have myasthenia gravis, Lambert-Eaton syndrome, or ALS.  It is also possible that as with any injection, there may be an allergic reaction or no effect from the medication. Reduced effectiveness after repeated injections is sometimes seen and rarely infection at the injection site may occur. All care will be taken to prevent these side effects. If therapy is given over a long time, atrophy and wasting in the muscle injected may occur. Occasionally the patient's become refractory to treatment because they develop antibodies to the toxin. In this event, therapy needs to be modified.  I have read the above information and consent to the administration of botulism toxin.    BOTOX  PROCEDURE NOTE FOR MIGRAINE HEADACHE  Contraindications and precautions discussed with patient(above). Aseptic procedure was observed and patient tolerated procedure. Procedure performed by Greig Forbes, FNP-C.   The condition has existed for more than 6 months, and pt does not have a diagnosis of ALS, Myasthenia Gravis or Lambert-Eaton Syndrome.  Risks and benefits of injections discussed and pt agrees to proceed with the procedure.  Written consent obtained  These injections are medically necessary. Pt  receives good benefits from these injections. These injections do not cause sedations or hallucinations which the oral therapies may cause.   Description of procedure:  The patient was  placed in a sitting position. The standard protocol was used for Botox  as follows, with 5 units of Botox  injected at each site:  -Procerus muscle, midline  injection  -Corrugator muscle, bilateral injection  -Frontalis muscle, bilateral injection, with 2 sites each side, medial injection was performed in the upper one third of the frontalis muscle, in the region vertical from the medial inferior edge of the superior orbital rim. The lateral injection was again in the upper one third of the forehead vertically above the lateral limbus of the cornea, 1.5 cm lateral to the medial injection site.  -Temporalis muscle injection, 4 sites, bilaterally. The first injection was 3 cm above the tragus of the ear, second injection site was 1.5 cm to 3 cm up from the first injection site in line with the tragus of the ear. The third injection site was 1.5-3 cm forward between the first 2 injection sites. The fourth injection site was 1.5 cm posterior to the second injection site. 5th site laterally in the temporalis  muscleat the level of the outer canthus.  -Occipitalis muscle injection, 3 sites, bilaterally. The first injection was done one half way between the occipital protuberance and the tip of the mastoid process behind the ear. The second injection site was done lateral and superior to the first, 1 fingerbreadth from the first injection. The third injection site was 1 fingerbreadth superiorly and medially from the first injection site.  -Cervical paraspinal muscle injection, 2 sites, bilaterally. The first injection site was 1 cm from the midline of the cervical spine, 3 cm inferior to the lower border of the occipital protuberance. The second injection site was 1.5 cm superiorly and laterally to the first injection site.  -Trapezius muscle injection was performed at 3 sites, bilaterally. The first injection site was in the upper trapezius muscle halfway between the inflection point of the neck, and the acromion. The second injection site was one half way between the acromion and the first injection site. The third injection was done between the first injection  site and the inflection point of the neck.   Will return for repeat injection in 3 months.   A total of 200 units of Botox  was prepared, 155 units of Botox  was injected as documented above, any Botox  not injected was wasted. The patient tolerated the procedure well, there were no complications of the above procedure.  "

## 2024-04-26 NOTE — Addendum Note (Signed)
 Addended by: CARY NO L on: 04/26/2024 03:44 PM   Modules accepted: Orders

## 2024-07-18 ENCOUNTER — Ambulatory Visit: Admitting: Urgent Care

## 2024-07-19 ENCOUNTER — Ambulatory Visit: Admitting: Family Medicine
